# Patient Record
Sex: Male | Born: 1940 | Race: White | Hispanic: No | Smoking: Former smoker
Health system: Southern US, Community
[De-identification: ages and names within clinical notes are randomized; demographics above are authoritative.]

## PROBLEM LIST (undated history)

## (undated) DIAGNOSIS — I4891 Unspecified atrial fibrillation: Secondary | ICD-10-CM

## (undated) DIAGNOSIS — E119 Type 2 diabetes mellitus without complications: Secondary | ICD-10-CM

## (undated) DIAGNOSIS — I2510 Atherosclerotic heart disease of native coronary artery without angina pectoris: Secondary | ICD-10-CM

## (undated) DIAGNOSIS — M1990 Unspecified osteoarthritis, unspecified site: Secondary | ICD-10-CM

## (undated) DIAGNOSIS — I10 Essential (primary) hypertension: Secondary | ICD-10-CM

## (undated) DIAGNOSIS — M0520 Rheumatoid vasculitis with rheumatoid arthritis of unspecified site: Secondary | ICD-10-CM

## (undated) HISTORY — PX: CORONARY ANGIOPLASTY: SHX604

## (undated) HISTORY — PX: CARDIAC CATHETERIZATION: SHX172

## (undated) HISTORY — PX: HIP SURGERY: SHX245

---

## 1998-06-24 ENCOUNTER — Encounter: Payer: Self-pay | Admitting: Rheumatology

## 1998-06-24 ENCOUNTER — Inpatient Hospital Stay (HOSPITAL_COMMUNITY): Admission: AD | Admit: 1998-06-24 | Discharge: 1998-06-28 | Payer: Self-pay | Admitting: Rheumatology

## 1998-06-25 ENCOUNTER — Encounter: Payer: Self-pay | Admitting: Rheumatology

## 1998-06-26 ENCOUNTER — Encounter: Payer: Self-pay | Admitting: Rheumatology

## 1998-06-27 ENCOUNTER — Encounter: Payer: Self-pay | Admitting: Rheumatology

## 1998-07-10 ENCOUNTER — Ambulatory Visit (HOSPITAL_COMMUNITY): Admission: RE | Admit: 1998-07-10 | Discharge: 1998-07-10 | Payer: Self-pay | Admitting: Rheumatology

## 1998-07-10 ENCOUNTER — Encounter: Payer: Self-pay | Admitting: Rheumatology

## 1999-05-07 ENCOUNTER — Encounter: Payer: Self-pay | Admitting: Rheumatology

## 1999-05-07 ENCOUNTER — Ambulatory Visit (HOSPITAL_COMMUNITY): Admission: RE | Admit: 1999-05-07 | Discharge: 1999-05-07 | Payer: Self-pay | Admitting: Rheumatology

## 2000-12-06 ENCOUNTER — Encounter: Admission: RE | Admit: 2000-12-06 | Discharge: 2001-03-06 | Payer: Self-pay | Admitting: Rheumatology

## 2001-01-05 ENCOUNTER — Ambulatory Visit (HOSPITAL_COMMUNITY): Admission: RE | Admit: 2001-01-05 | Discharge: 2001-01-06 | Payer: Self-pay | Admitting: Cardiology

## 2001-04-29 ENCOUNTER — Ambulatory Visit (HOSPITAL_COMMUNITY): Admission: RE | Admit: 2001-04-29 | Discharge: 2001-04-29 | Payer: Self-pay | Admitting: Gastroenterology

## 2011-04-16 ENCOUNTER — Encounter (HOSPITAL_BASED_OUTPATIENT_CLINIC_OR_DEPARTMENT_OTHER): Payer: Self-pay | Admitting: *Deleted

## 2011-04-16 ENCOUNTER — Emergency Department (HOSPITAL_BASED_OUTPATIENT_CLINIC_OR_DEPARTMENT_OTHER)
Admission: EM | Admit: 2011-04-16 | Discharge: 2011-04-16 | Discharge status: Against Medical Advice | Payer: Medicare Other | Attending: Emergency Medicine | Admitting: Emergency Medicine

## 2011-04-16 DIAGNOSIS — M129 Arthropathy, unspecified: Secondary | ICD-10-CM | POA: Insufficient documentation

## 2011-04-16 DIAGNOSIS — I2510 Atherosclerotic heart disease of native coronary artery without angina pectoris: Secondary | ICD-10-CM | POA: Insufficient documentation

## 2011-04-16 DIAGNOSIS — E119 Type 2 diabetes mellitus without complications: Secondary | ICD-10-CM | POA: Insufficient documentation

## 2011-04-16 DIAGNOSIS — I10 Essential (primary) hypertension: Secondary | ICD-10-CM | POA: Insufficient documentation

## 2011-04-16 DIAGNOSIS — T18108A Unspecified foreign body in esophagus causing other injury, initial encounter: Secondary | ICD-10-CM | POA: Insufficient documentation

## 2011-04-16 DIAGNOSIS — Z79899 Other long term (current) drug therapy: Secondary | ICD-10-CM | POA: Insufficient documentation

## 2011-04-16 DIAGNOSIS — IMO0002 Reserved for concepts with insufficient information to code with codable children: Secondary | ICD-10-CM | POA: Insufficient documentation

## 2011-04-16 DIAGNOSIS — Z87891 Personal history of nicotine dependence: Secondary | ICD-10-CM | POA: Insufficient documentation

## 2011-04-16 HISTORY — DX: Atherosclerotic heart disease of native coronary artery without angina pectoris: I25.10

## 2011-04-16 HISTORY — DX: Unspecified osteoarthritis, unspecified site: M19.90

## 2011-04-16 HISTORY — DX: Essential (primary) hypertension: I10

## 2011-04-16 MED ORDER — GLUCAGON HCL (RDNA) 1 MG IJ SOLR
1.0000 mg | Freq: Once | INTRAMUSCULAR | Status: DC
  Filled 2011-04-16: qty 1

## 2011-04-16 NOTE — ED Provider Notes (Signed)
Medical screening examination/treatment/procedure(s) were performed by non-physician practitioner and as supervising physician I was immediately available for consultation/collaboration.  Therin Vetsch, MD 04/16/11 1809 
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
## 2011-04-16 NOTE — ED Notes (Signed)
Was seen at [facility] this am with c.o something stuck in his throat since eating chicken yesterday for lunch. Went here for evaluation.

## 2011-04-16 NOTE — ED Provider Notes (Signed)
History     CSN: [id number]  Arrival date & time 04/16/11  1252   First MD Initiated Contact with Patient 04/16/11 1403      Chief Complaint  Patient presents with  . Foreign Body    (Consider location/radiation/quality/duration/timing/severity/associated sxs/prior treatment) HPI Comments: Pt states that he was eating a cold chicken breast yesterday and he is continuing to have a feeling of it being stuck at about mid chest:pt states that he was sent over by his pcp:pt states that he has intermittent gotten the sensation over the last month, but it usually resolved on it own:pt denies vomiting,sob, and pt states that he can swallow his spit but if he drinks water it comes right back up  The history is provided by the patient. No language interpreter was used.    Past Medical History  Diagnosis Date  . Arthritis   . Diabetes mellitus   . Coronary artery disease   . Hypertension     Past Surgical History  Procedure Date  . Hip surgery     History reviewed. No pertinent family history.  History  Substance Use Topics  . Smoking status: Former Games developer  . Smokeless tobacco: Not on file  . Alcohol Use: No      Review of Systems  Constitutional: Negative.   HENT: Negative.   Respiratory: Negative.   Cardiovascular: Negative.   Genitourinary: Negative.   Neurological: Negative.     Allergies  Review of patient's allergies indicates no known allergies.  Home Medications   Current Outpatient Rx  Name Route Sig Dispense Refill  . ASPIRIN 81 MG PO TABS Oral Take 81 mg by mouth daily.    Marland Kitchen LISINOPRIL PO Oral Take by mouth.    . METFORMIN HCL 500 MG PO TABS Oral Take 500 mg by mouth 2 (two) times daily with a meal.    . METHOTREXATE PO Oral Take by mouth.    Marland Kitchen POTASSIUM CHLORIDE PO Oral Take by mouth.    Marland Kitchen PRAVACHOL PO Oral Take by mouth.    Marland Kitchen PREDNISONE (PAK) PO Oral Take by mouth.    Marland Kitchen PROPRANOLOL HCL ER PO Oral Take by mouth.      BP 109/71  Pulse 71   Temp(Src) 98.2 F (36.8 C) (Oral)  Resp 20  SpO2 100%  Physical Exam  Nursing note and vitals reviewed. Constitutional: He appears well-developed and well-nourished.  HENT:  Head: Normocephalic and atraumatic.  Cardiovascular: Normal rate and regular rhythm.   Pulmonary/Chest: Effort normal and breath sounds normal.  Abdominal: Soft. Bowel sounds are normal. There is no tenderness.  Musculoskeletal: Normal range of motion.  Neurological: He is alert.  Skin: Skin is warm and dry.    ED Course  Procedures (including critical care time)  Labs Reviewed - No data to display No results found.   1. Esophageal foreign body       MDM  Discussed with pt that we could try glucagon here, but we don't have a scope here:pt  Very upset with this and discussed that if we deemed necessary we could transport to another facility:pt left after conversation without notifying anyone        Teressa Lower, NP 04/16/11 1549

## 2016-12-02 ENCOUNTER — Ambulatory Visit: Payer: No Typology Code available for payment source | Admitting: Adult Health

## 2020-01-26 ENCOUNTER — Other Ambulatory Visit: Payer: Self-pay

## 2020-01-26 ENCOUNTER — Emergency Department (HOSPITAL_BASED_OUTPATIENT_CLINIC_OR_DEPARTMENT_OTHER): Payer: Medicare Other

## 2020-01-26 ENCOUNTER — Encounter (HOSPITAL_BASED_OUTPATIENT_CLINIC_OR_DEPARTMENT_OTHER): Payer: Self-pay | Admitting: Emergency Medicine

## 2020-01-26 ENCOUNTER — Inpatient Hospital Stay (HOSPITAL_BASED_OUTPATIENT_CLINIC_OR_DEPARTMENT_OTHER)
Admission: EM | Admit: 2020-01-26 | Discharge: 2020-03-19 | DRG: 177 | Disposition: E | Payer: Medicare Other | Attending: Family Medicine | Admitting: Family Medicine

## 2020-01-26 DIAGNOSIS — R52 Pain, unspecified: Secondary | ICD-10-CM

## 2020-01-26 DIAGNOSIS — R069 Unspecified abnormalities of breathing: Secondary | ICD-10-CM

## 2020-01-26 DIAGNOSIS — E1165 Type 2 diabetes mellitus with hyperglycemia: Secondary | ICD-10-CM | POA: Diagnosis present

## 2020-01-26 DIAGNOSIS — T361X5A Adverse effect of cephalosporins and other beta-lactam antibiotics, initial encounter: Secondary | ICD-10-CM | POA: Diagnosis not present

## 2020-01-26 DIAGNOSIS — J189 Pneumonia, unspecified organism: Secondary | ICD-10-CM

## 2020-01-26 DIAGNOSIS — Z833 Family history of diabetes mellitus: Secondary | ICD-10-CM

## 2020-01-26 DIAGNOSIS — R1310 Dysphagia, unspecified: Secondary | ICD-10-CM | POA: Diagnosis present

## 2020-01-26 DIAGNOSIS — J9601 Acute respiratory failure with hypoxia: Secondary | ICD-10-CM

## 2020-01-26 DIAGNOSIS — R109 Unspecified abdominal pain: Secondary | ICD-10-CM

## 2020-01-26 DIAGNOSIS — L89156 Pressure-induced deep tissue damage of sacral region: Secondary | ICD-10-CM | POA: Diagnosis present

## 2020-01-26 DIAGNOSIS — E871 Hypo-osmolality and hyponatremia: Secondary | ICD-10-CM

## 2020-01-26 DIAGNOSIS — Z881 Allergy status to other antibiotic agents status: Secondary | ICD-10-CM

## 2020-01-26 DIAGNOSIS — R0602 Shortness of breath: Secondary | ICD-10-CM

## 2020-01-26 DIAGNOSIS — Z7902 Long term (current) use of antithrombotics/antiplatelets: Secondary | ICD-10-CM

## 2020-01-26 DIAGNOSIS — M6088 Other myositis, other site: Secondary | ICD-10-CM | POA: Diagnosis present

## 2020-01-26 DIAGNOSIS — Z79899 Other long term (current) drug therapy: Secondary | ICD-10-CM

## 2020-01-26 DIAGNOSIS — J15211 Pneumonia due to Methicillin susceptible Staphylococcus aureus: Secondary | ICD-10-CM | POA: Diagnosis not present

## 2020-01-26 DIAGNOSIS — M25569 Pain in unspecified knee: Secondary | ICD-10-CM

## 2020-01-26 DIAGNOSIS — M4656 Other infective spondylopathies, lumbar region: Secondary | ICD-10-CM | POA: Diagnosis present

## 2020-01-26 DIAGNOSIS — D696 Thrombocytopenia, unspecified: Secondary | ICD-10-CM

## 2020-01-26 DIAGNOSIS — I5032 Chronic diastolic (congestive) heart failure: Secondary | ICD-10-CM | POA: Diagnosis present

## 2020-01-26 DIAGNOSIS — R0600 Dyspnea, unspecified: Secondary | ICD-10-CM

## 2020-01-26 DIAGNOSIS — R0902 Hypoxemia: Secondary | ICD-10-CM

## 2020-01-26 DIAGNOSIS — J1282 Pneumonia due to coronavirus disease 2019: Secondary | ICD-10-CM

## 2020-01-26 DIAGNOSIS — R0603 Acute respiratory distress: Secondary | ICD-10-CM

## 2020-01-26 DIAGNOSIS — E1151 Type 2 diabetes mellitus with diabetic peripheral angiopathy without gangrene: Secondary | ICD-10-CM | POA: Diagnosis present

## 2020-01-26 DIAGNOSIS — Y92230 Patient room in hospital as the place of occurrence of the external cause: Secondary | ICD-10-CM | POA: Diagnosis not present

## 2020-01-26 DIAGNOSIS — G928 Other toxic encephalopathy: Secondary | ICD-10-CM | POA: Diagnosis not present

## 2020-01-26 DIAGNOSIS — I110 Hypertensive heart disease with heart failure: Secondary | ICD-10-CM | POA: Diagnosis present

## 2020-01-26 DIAGNOSIS — U071 COVID-19: Principal | ICD-10-CM

## 2020-01-26 DIAGNOSIS — I2510 Atherosclerotic heart disease of native coronary artery without angina pectoris: Secondary | ICD-10-CM | POA: Diagnosis present

## 2020-01-26 DIAGNOSIS — Z7952 Long term (current) use of systemic steroids: Secondary | ICD-10-CM

## 2020-01-26 DIAGNOSIS — Z515 Encounter for palliative care: Secondary | ICD-10-CM

## 2020-01-26 DIAGNOSIS — N170 Acute kidney failure with tubular necrosis: Secondary | ICD-10-CM | POA: Diagnosis present

## 2020-01-26 DIAGNOSIS — E11649 Type 2 diabetes mellitus with hypoglycemia without coma: Secondary | ICD-10-CM | POA: Diagnosis not present

## 2020-01-26 DIAGNOSIS — B9561 Methicillin susceptible Staphylococcus aureus infection as the cause of diseases classified elsewhere: Secondary | ICD-10-CM

## 2020-01-26 DIAGNOSIS — Z791 Long term (current) use of non-steroidal anti-inflammatories (NSAID): Secondary | ICD-10-CM

## 2020-01-26 DIAGNOSIS — D638 Anemia in other chronic diseases classified elsewhere: Secondary | ICD-10-CM | POA: Diagnosis present

## 2020-01-26 DIAGNOSIS — L8990 Pressure ulcer of unspecified site, unspecified stage: Secondary | ICD-10-CM | POA: Insufficient documentation

## 2020-01-26 DIAGNOSIS — M069 Rheumatoid arthritis, unspecified: Secondary | ICD-10-CM | POA: Diagnosis present

## 2020-01-26 DIAGNOSIS — R7881 Bacteremia: Secondary | ICD-10-CM

## 2020-01-26 DIAGNOSIS — E669 Obesity, unspecified: Secondary | ICD-10-CM | POA: Diagnosis present

## 2020-01-26 DIAGNOSIS — E1159 Type 2 diabetes mellitus with other circulatory complications: Secondary | ICD-10-CM | POA: Diagnosis present

## 2020-01-26 DIAGNOSIS — Z66 Do not resuscitate: Secondary | ICD-10-CM | POA: Diagnosis not present

## 2020-01-26 DIAGNOSIS — K5900 Constipation, unspecified: Secondary | ICD-10-CM | POA: Diagnosis present

## 2020-01-26 DIAGNOSIS — I480 Paroxysmal atrial fibrillation: Secondary | ICD-10-CM | POA: Diagnosis present

## 2020-01-26 DIAGNOSIS — D6959 Other secondary thrombocytopenia: Secondary | ICD-10-CM | POA: Diagnosis not present

## 2020-01-26 DIAGNOSIS — A4101 Sepsis due to Methicillin susceptible Staphylococcus aureus: Secondary | ICD-10-CM | POA: Diagnosis not present

## 2020-01-26 DIAGNOSIS — Z95828 Presence of other vascular implants and grafts: Secondary | ICD-10-CM

## 2020-01-26 DIAGNOSIS — I4821 Permanent atrial fibrillation: Secondary | ICD-10-CM | POA: Diagnosis present

## 2020-01-26 DIAGNOSIS — J069 Acute upper respiratory infection, unspecified: Secondary | ICD-10-CM | POA: Diagnosis present

## 2020-01-26 DIAGNOSIS — J80 Acute respiratory distress syndrome: Secondary | ICD-10-CM | POA: Diagnosis not present

## 2020-01-26 DIAGNOSIS — R6521 Severe sepsis with septic shock: Secondary | ICD-10-CM | POA: Diagnosis not present

## 2020-01-26 DIAGNOSIS — R54 Age-related physical debility: Secondary | ICD-10-CM | POA: Diagnosis present

## 2020-01-26 DIAGNOSIS — Z7984 Long term (current) use of oral hypoglycemic drugs: Secondary | ICD-10-CM

## 2020-01-26 DIAGNOSIS — Z6829 Body mass index (BMI) 29.0-29.9, adult: Secondary | ICD-10-CM

## 2020-01-26 DIAGNOSIS — Z87891 Personal history of nicotine dependence: Secondary | ICD-10-CM

## 2020-01-26 DIAGNOSIS — G062 Extradural and subdural abscess, unspecified: Secondary | ICD-10-CM | POA: Diagnosis present

## 2020-01-26 DIAGNOSIS — Z955 Presence of coronary angioplasty implant and graft: Secondary | ICD-10-CM

## 2020-01-26 HISTORY — DX: Type 2 diabetes mellitus without complications: E11.9

## 2020-01-26 HISTORY — DX: Unspecified atrial fibrillation: I48.91

## 2020-01-26 HISTORY — DX: Rheumatoid vasculitis with rheumatoid arthritis of unspecified site: M05.20

## 2020-01-26 LAB — HEPATIC FUNCTION PANEL
ALT: 18 U/L (ref 0–44)
AST: 37 U/L (ref 15–41)
Albumin: 3.4 g/dL — ABNORMAL LOW (ref 3.5–5.0)
Alkaline Phosphatase: 31 U/L — ABNORMAL LOW (ref 38–126)
Bilirubin, Direct: 0.3 mg/dL — ABNORMAL HIGH (ref 0.0–0.2)
Indirect Bilirubin: 0.8 mg/dL (ref 0.3–0.9)
Total Bilirubin: 1.1 mg/dL (ref 0.3–1.2)
Total Protein: 7 g/dL (ref 6.5–8.1)

## 2020-01-26 LAB — COMPREHENSIVE METABOLIC PANEL
ALT: 18 U/L (ref 0–44)
AST: 30 U/L (ref 15–41)
Albumin: 2.8 g/dL — ABNORMAL LOW (ref 3.5–5.0)
Alkaline Phosphatase: 31 U/L — ABNORMAL LOW (ref 38–126)
Anion gap: 11 (ref 5–15)
BUN: 13 mg/dL (ref 8–23)
CO2: 19 mmol/L — ABNORMAL LOW (ref 22–32)
Calcium: 7.5 mg/dL — ABNORMAL LOW (ref 8.9–10.3)
Chloride: 98 mmol/L (ref 98–111)
Creatinine, Ser: 0.85 mg/dL (ref 0.61–1.24)
GFR, Estimated: >60 mL/min (ref >60)
Glucose, Bld: 119 mg/dL — ABNORMAL HIGH (ref 70–99)
Potassium: 3.7 mmol/L (ref 3.5–5.1)
Sodium: 128 mmol/L — ABNORMAL LOW (ref 135–145)
Total Bilirubin: 0.9 mg/dL (ref 0.3–1.2)
Total Protein: 5.7 g/dL — ABNORMAL LOW (ref 6.5–8.1)

## 2020-01-26 LAB — CBC
HCT: 34.4 % — ABNORMAL LOW (ref 39.0–52.0)
Hemoglobin: 11.7 g/dL — ABNORMAL LOW (ref 13.0–17.0)
MCH: 31.7 pg (ref 26.0–34.0)
MCHC: 34 g/dL (ref 30.0–36.0)
MCV: 93.2 fL (ref 80.0–100.0)
Platelets: 195 10*3/uL (ref 150–400)
RBC: 3.69 MIL/uL — ABNORMAL LOW (ref 4.22–5.81)
RDW: 14.6 % (ref 11.5–15.5)
WBC: 3.7 10*3/uL — ABNORMAL LOW (ref 4.0–10.5)
nRBC: 0 % (ref 0.0–0.2)

## 2020-01-26 LAB — BASIC METABOLIC PANEL
Anion gap: 14 (ref 5–15)
BUN: 13 mg/dL (ref 8–23)
CO2: 19 mmol/L — ABNORMAL LOW (ref 22–32)
Calcium: 7.9 mg/dL — ABNORMAL LOW (ref 8.9–10.3)
Chloride: 96 mmol/L — ABNORMAL LOW (ref 98–111)
Creatinine, Ser: 1.05 mg/dL (ref 0.61–1.24)
GFR, Estimated: >60 mL/min (ref >60)
Glucose, Bld: 127 mg/dL — ABNORMAL HIGH (ref 70–99)
Potassium: 3.7 mmol/L (ref 3.5–5.1)
Sodium: 129 mmol/L — ABNORMAL LOW (ref 135–145)

## 2020-01-26 LAB — TROPONIN I (HIGH SENSITIVITY)
Troponin I (High Sensitivity): 12 ng/L (ref <18)
Troponin I (High Sensitivity): 13 ng/L (ref <18)

## 2020-01-26 LAB — URINALYSIS, ROUTINE W REFLEX MICROSCOPIC
Bilirubin Urine: NEGATIVE
Glucose, UA: NEGATIVE mg/dL
Hgb urine dipstick: NEGATIVE
Ketones, ur: 15 mg/dL — AB
Leukocytes,Ua: NEGATIVE
Nitrite: NEGATIVE
Protein, ur: NEGATIVE mg/dL
Specific Gravity, Urine: 1.015 (ref 1.005–1.030)
pH: 5 (ref 5.0–8.0)

## 2020-01-26 LAB — LIPASE, BLOOD: Lipase: 46 U/L (ref 11–51)

## 2020-01-26 LAB — TRIGLYCERIDES: Triglycerides: 67 mg/dL (ref <150)

## 2020-01-26 LAB — RESP PANEL BY RT-PCR (FLU A&B, COVID) ARPGX2
Influenza A by PCR: NEGATIVE
Influenza B by PCR: NEGATIVE
SARS Coronavirus 2 by RT PCR: POSITIVE — AB

## 2020-01-26 LAB — C-REACTIVE PROTEIN: CRP: 17.6 mg/dL — ABNORMAL HIGH (ref <1.0)

## 2020-01-26 LAB — PROCALCITONIN: Procalcitonin: 0.17 ng/mL

## 2020-01-26 LAB — BRAIN NATRIURETIC PEPTIDE: B Natriuretic Peptide: 93.1 pg/mL (ref 0.0–100.0)

## 2020-01-26 LAB — D-DIMER, QUANTITATIVE: D-Dimer, Quant: 2.01 ug/mL-FEU — ABNORMAL HIGH (ref 0.00–0.50)

## 2020-01-26 LAB — LACTIC ACID, PLASMA
Lactic Acid, Venous: 1.2 mmol/L (ref 0.5–1.9)
Lactic Acid, Venous: 1.5 mmol/L (ref 0.5–1.9)

## 2020-01-26 LAB — LACTATE DEHYDROGENASE: LDH: 280 U/L — ABNORMAL HIGH (ref 98–192)

## 2020-01-26 LAB — FERRITIN: Ferritin: 503 ng/mL — ABNORMAL HIGH (ref 24–336)

## 2020-01-26 LAB — FIBRINOGEN: Fibrinogen: 574 mg/dL — ABNORMAL HIGH (ref 210–475)

## 2020-01-26 IMAGING — CR DG CHEST 2V
2 series · 2 of 2 positions shown · non-contrast
Comparison: [DATE] and [DATE]

CLINICAL DATA: Shortness of breath, cough.

EXAM:
CHEST - 2 VIEW

[w chest pa]
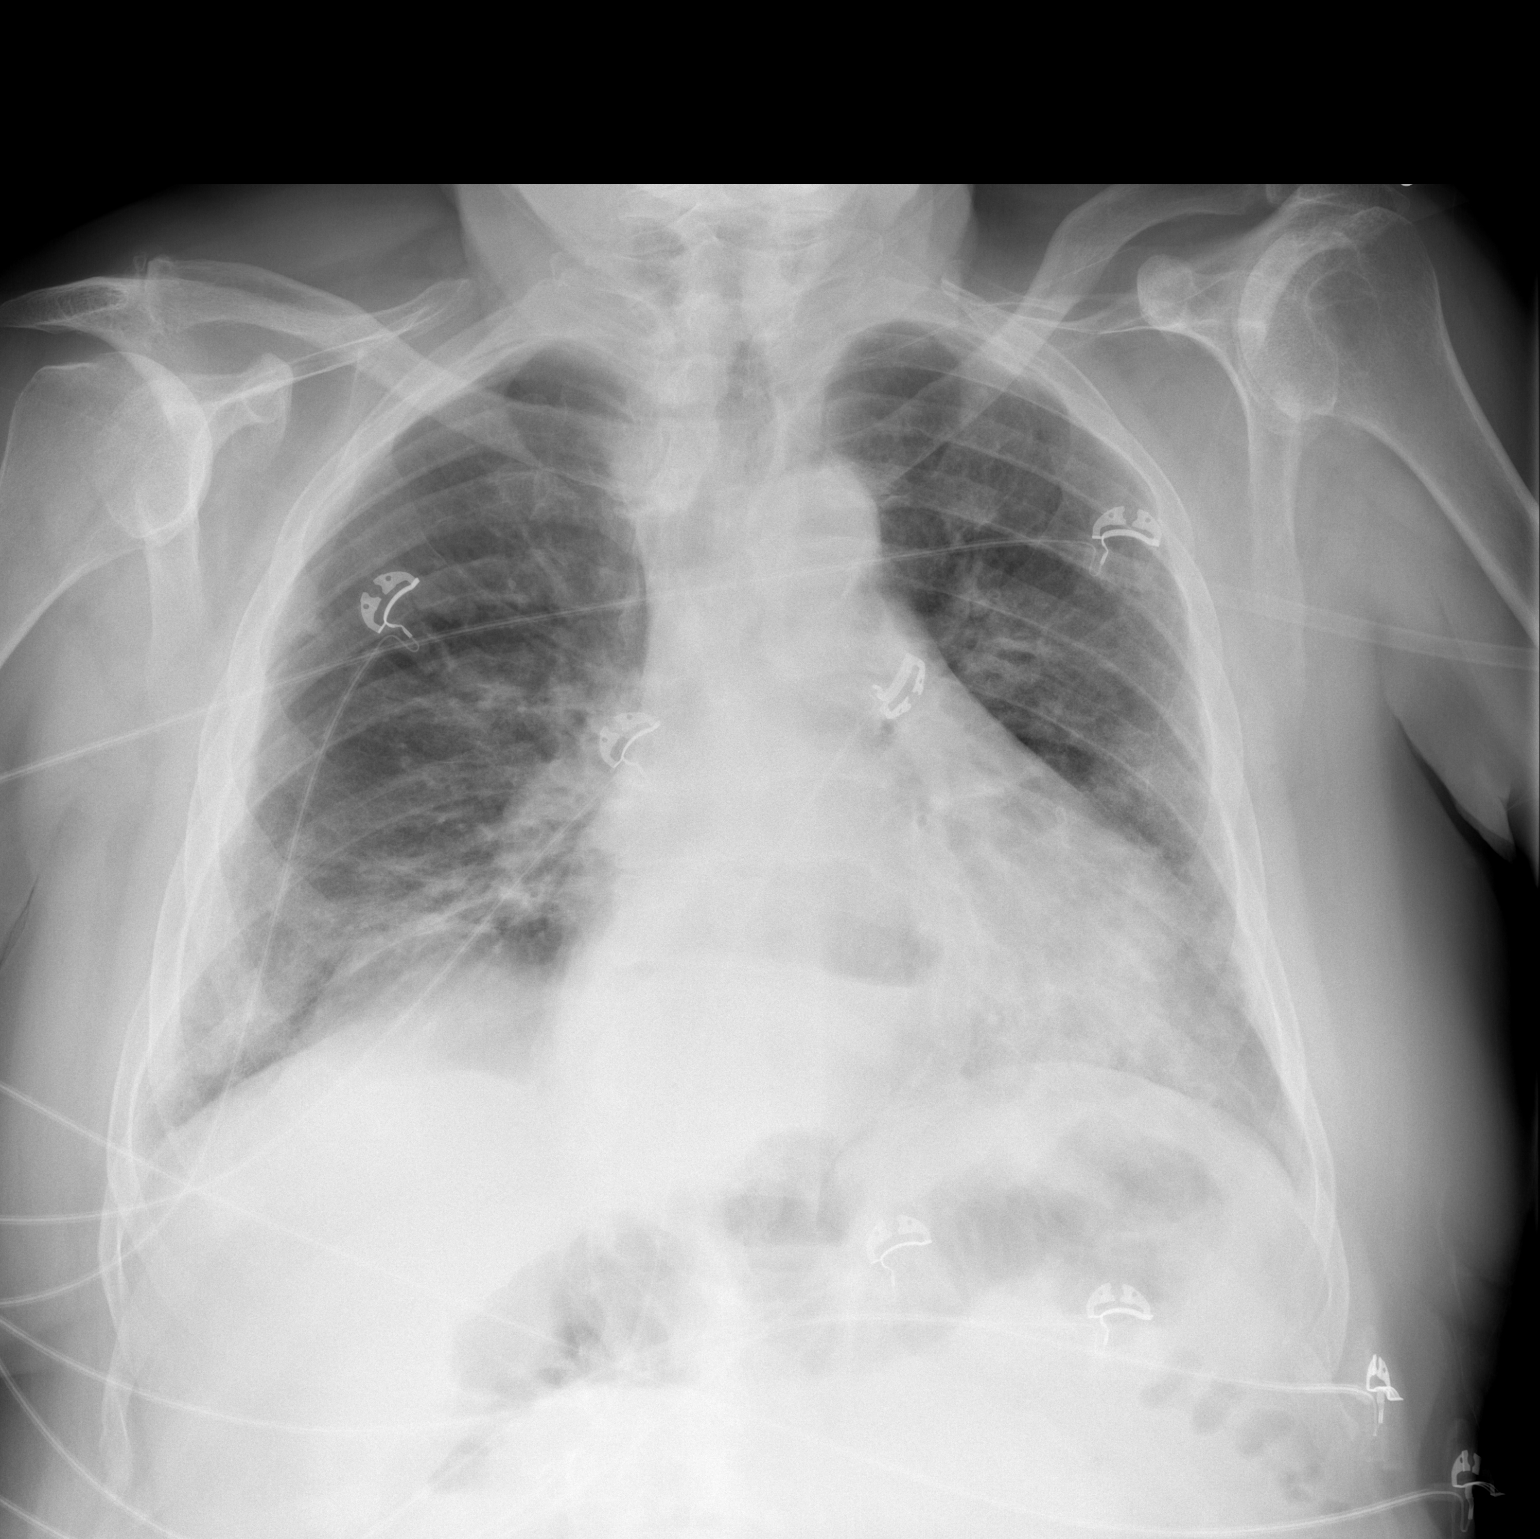

[w chest lat]
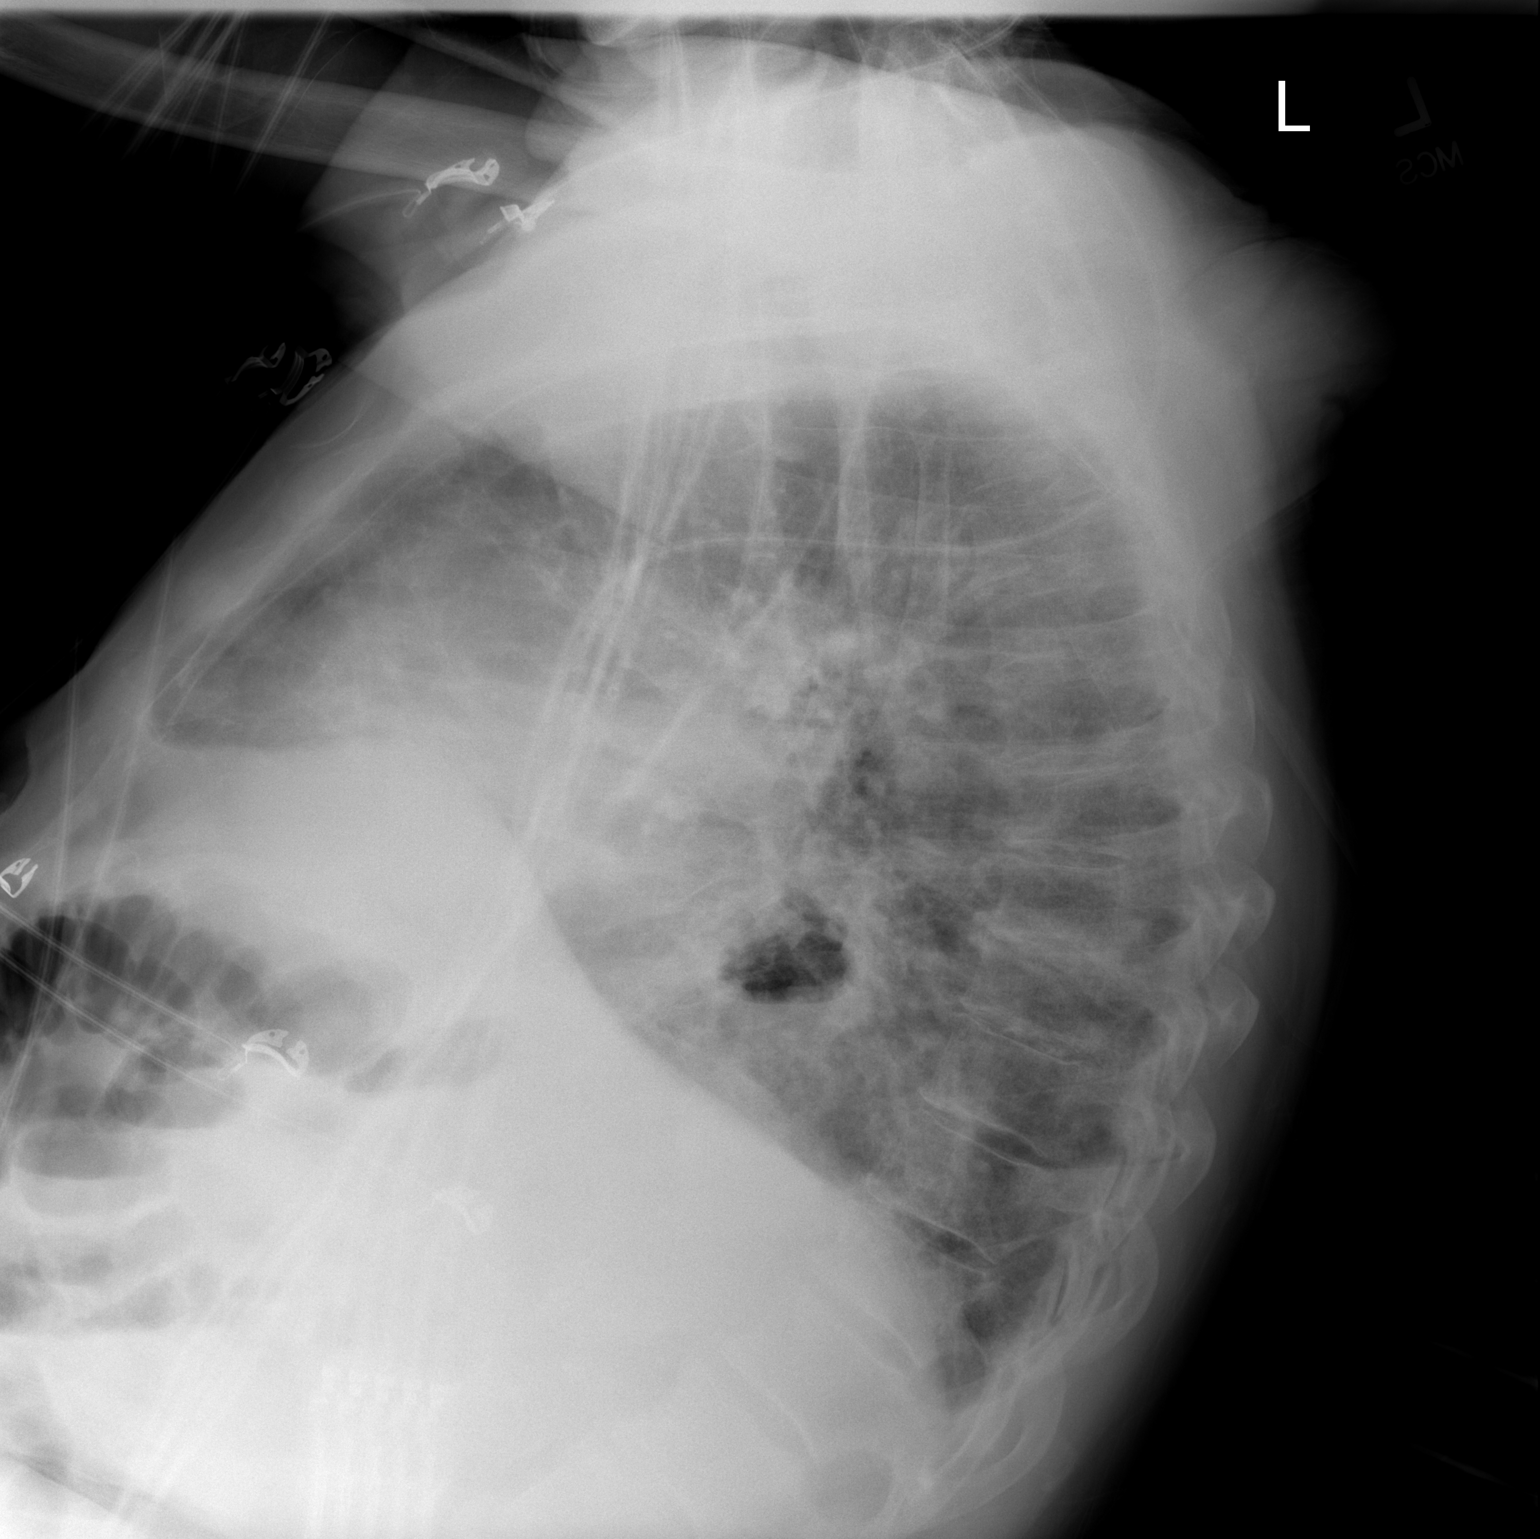

[2 of 2 positions shown; findings below may reference images not displayed]

FINDINGS: Cardiac enlargement similar to the prior study. Eventration of the
RIGHT hemidiaphragm is also similar. Large hiatal hernia behind the
heart.

Patchy opacities in the bilateral chest with basilar and mid lung
predominance. No lobar consolidative changes. No sign of pleural
effusion.

On limited assessment no acute skeletal process.
IMPRESSION: 1. Multifocal opacities suspicious for multifocal pneumonia,
consider COVID 19 infection.
2. Large hiatal hernia.
3. Cardiomegaly as before.

## 2020-01-26 MED ORDER — SODIUM CHLORIDE 0.9 % IV SOLN
100.0000 mg | INTRAVENOUS | Status: AC
  Administered 2020-01-26 (×2): 100 mg via INTRAVENOUS

## 2020-01-26 MED ORDER — DEXAMETHASONE SODIUM PHOSPHATE 10 MG/ML IJ SOLN
8.0000 mg | Freq: Once | INTRAMUSCULAR | Status: AC
  Administered 2020-01-26: 8 mg via INTRAVENOUS
  Filled 2020-01-26: qty 1

## 2020-01-26 MED ORDER — ALBUTEROL SULFATE HFA 108 (90 BASE) MCG/ACT IN AERS
2.0000 | INHALATION_SPRAY | RESPIRATORY_TRACT | Status: DC | PRN
  Administered 2020-01-30 – 2020-02-08 (×3): 2 via RESPIRATORY_TRACT
  Filled 2020-01-26 (×2): qty 6.7

## 2020-01-26 MED ORDER — SODIUM CHLORIDE 0.9 % IV SOLN
100.0000 mg | Freq: Every day | INTRAVENOUS | Status: AC
  Administered 2020-01-27 – 2020-01-30 (×4): 100 mg via INTRAVENOUS
  Filled 2020-01-26: qty 100
  Filled 2020-01-26 (×2): qty 2.5
  Filled 2020-01-26: qty 100

## 2020-01-26 MED ORDER — ACETAMINOPHEN 500 MG PO TABS
500.0000 mg | ORAL_TABLET | Freq: Once | ORAL | Status: AC
  Administered 2020-01-26: 500 mg via ORAL
  Filled 2020-01-26: qty 1

## 2020-01-26 NOTE — ED Provider Notes (Signed)
[facility] EMERGENCY DEPARTMENT Provider Note   CSN: [id number] Arrival date & time: 01/31/2020  1305     History Chief Complaint  Patient presents with  . Shortness of Breath    + Covid    Javier Beard is a 80 y.o. male history includes CAD, diabetes, hypertension, obesity.  Patient arrives today with his daughter who provides supplemental history.  And family report patient experiencing nausea vomiting diarrhea x2 weeks, symptoms gradually resolved over the past 1 week, he was feeling well until yesterday he reports diarrhea returned.  He reports multiple episodes of nonbloody diarrhea without abdominal pain.  Simultaneously he developed shortness of breath yesterday he reports difficulty catching his breath that has been constant no clear aggravating factors not associated with exertion.  He is also developed a mild nonproductive cough and mild generalized headache.  Denies fever, vision changes, difficulty speaking, numbness/weakness, tingling, chest pain, hemoptysis, abdominal pain, vomiting, dysuria/hematuria or any additional concerns.  Of note patient reports he received 2 Covid vaccines in April 2021. HPI     Past Medical History:  Diagnosis Date  . Arthritis   . Coronary artery disease   . Diabetes mellitus   . Hypertension     There are no problems to display for this patient.   Past Surgical History:  Procedure Laterality Date  . HIP SURGERY         No family history on file.  Social History   Tobacco Use  . Smoking status: Former Games developer  . Smokeless tobacco: Never Used  Substance Use Topics  . Alcohol use: No  . Drug use: No    Home Medications Prior to Admission medications   Medication Sig Start Date End Date Taking? Authorizing Provider  acetaminophen (TYLENOL) 650 MG CR tablet Take 650 mg by mouth daily. 08/27/15  Yes [provider]  calcium carbonate (OSCAL) 1500 (600 Ca) MG TABS tablet Take 600-1,200 mg by mouth See admin  instructions. Take 1200mg  by mouth in the morning and 600mg  at bedtime. 09/30/15  Yes [provider]  denosumab (PROLIA) 60 MG/ML SOSY injection Inject 60 mg into the skin every 6 (six) months.   Yes [provider]  diclofenac Sodium (VOLTAREN) 1 % GEL Apply 1 application topically daily as needed for pain. 02/25/15  Yes [provider]  doxazosin (CARDURA) 1 MG tablet Take 1 mg by mouth at bedtime. 01/15/20  Yes [provider]  ELIQUIS 5 MG TABS tablet Take 5 mg by mouth 2 (two) times daily. 12/18/19  Yes [provider]  ergocalciferol (VITAMIN D2) 1.25 MG (50000 UT) capsule Take 1 capsule by mouth every 14 (fourteen) days. 09/27/19  Yes [provider]  folic acid (FOLVITE) 1 MG tablet Take 1 tablet by mouth daily. 11/15/19  Yes [provider]  furosemide (LASIX) 20 MG tablet Take 60 mg by mouth daily. 01/01/20  Yes [provider]  glipiZIDE (GLUCOTROL) 5 MG tablet Take 5 mg by mouth daily. 01/01/20  Yes [provider]  golimumab (SIMPONI ARIA) 50 MG/4ML SOLN injection Inject 12.5 mg into the vein every 8 (eight) weeks.   Yes [provider]  hydroxychloroquine (PLAQUENIL) 200 MG tablet Take 200 mg by mouth 2 (two) times daily. 01/01/20  Yes [provider]  losartan (COZAAR) 50 MG tablet Take 50 mg by mouth 2 (two) times daily. 11/13/19  Yes [provider]  methotrexate 2.5 MG tablet Take 25 mg by mouth once a week. 01/01/20  Yes [provider]  pantoprazole (PROTONIX) 40 MG tablet Take 40 mg by mouth daily. 01/01/20  Yes [provider]  potassium chloride (KLOR-CON) 10 MEQ tablet Take 10 mEq by mouth 2 (two) times daily.   Yes [provider]  pravastatin (PRAVACHOL) 80 MG tablet Take 80 mg by mouth daily.   Yes [provider]  predniSONE (DELTASONE) 5 MG tablet Take 5 mg by mouth daily. Take with food or milk.   Yes [provider]   vitamin B-12 (CYANOCOBALAMIN) 1000 MCG tablet Take 1,000 mcg by mouth daily.   Yes [provider]    Allergies    Patient has no known allergies.  Review of Systems   Review of Systems Ten systems are reviewed and are negative for acute change except as noted in the HPI  Physical Exam Updated Vital Signs BP 133/79   Pulse 94   Temp (!) 100.4 F (38 C) (Oral)   Resp 20   Ht 5\' 6"  (1.676 m)   Wt 81.6 kg   SpO2 96%   BMI 29.05 kg/m   Physical Exam Constitutional:      General: He is not in acute distress.    Appearance: Normal appearance. He is well-developed. He is not ill-appearing or diaphoretic.  HENT:     Head: Normocephalic and atraumatic.  Eyes:     General: Vision grossly intact. Gaze aligned appropriately.     Pupils: Pupils are equal, round, and reactive to light.  Neck:     Trachea: Trachea and phonation normal.  Cardiovascular:     Rate and Rhythm: Normal rate. Rhythm irregular.  Pulmonary:     Effort: Pulmonary effort is normal. No accessory muscle usage or respiratory distress.     Breath sounds: Rales present.  Abdominal:     General: There is no distension.     Palpations: Abdomen is soft.     Tenderness: There is no abdominal tenderness. There is no guarding or rebound.  Musculoskeletal:        General: Normal range of motion.     Cervical back: Normal range of motion.     Right lower leg: 2+ Edema present.     Left lower leg: 2+ Edema present.  Skin:    General: Skin is warm and dry.  Neurological:     Mental Status: He is alert.     GCS: GCS eye subscore is 4. GCS verbal subscore is 5. GCS motor subscore is 6.     Comments: Speech is clear and goal oriented, follows commands Major Cranial nerves without deficit, no facial droop Moves extremities without ataxia, coordination intact  Psychiatric:        Behavior: Behavior normal.     ED Results / Procedures / Treatments   Labs (all labs ordered are listed, but only abnormal  results are displayed) Labs Reviewed  RESP PANEL BY RT-PCR (FLU A&B, COVID) ARPGX2 - Abnormal; Notable for the following components:      Result Value   SARS Coronavirus 2 by RT PCR POSITIVE (*)    All other components within normal limits  BASIC METABOLIC PANEL - Abnormal; Notable for the following components:   Sodium 129 (*)    Chloride 96 (*)    CO2 19 (*)    Glucose, Bld 127 (*)    Calcium 7.9 (*)    All other components within normal limits  CBC - Abnormal; Notable for the following components:   WBC 3.7 (*)  RBC 3.69 (*)    Hemoglobin 11.7 (*)    HCT 34.4 (*)    All other components within normal limits  HEPATIC FUNCTION PANEL - Abnormal; Notable for the following components:   Albumin 3.4 (*)    Alkaline Phosphatase 31 (*)    Bilirubin, Direct 0.3 (*)    All other components within normal limits  CULTURE, BLOOD (ROUTINE X 2)  CULTURE, BLOOD (ROUTINE X 2)  BRAIN NATRIURETIC PEPTIDE  LIPASE, BLOOD  URINALYSIS, ROUTINE W REFLEX MICROSCOPIC  LACTIC ACID, PLASMA  LACTIC ACID, PLASMA  COMPREHENSIVE METABOLIC PANEL  D-DIMER, QUANTITATIVE (NOT AT [facility])  PROCALCITONIN  LACTATE DEHYDROGENASE  FERRITIN  TRIGLYCERIDES  FIBRINOGEN  C-REACTIVE PROTEIN  TROPONIN I (HIGH SENSITIVITY)  TROPONIN I (HIGH SENSITIVITY)    EKG EKG Interpretation  Date/Time:  Friday 17-Feb-2020 13:09:09 EST Ventricular Rate:  96 PR Interval:    QRS Duration: 86 QT Interval:  348 QTC Calculation: 439 R Axis:   90 Text Interpretation: Atrial fibrillation Rightward axis ST & T wave abnormality, consider lateral ischemia Abnormal ECG when compared to prior, possible new afib. will repeat. No sTEMI Confirmed by Theda Belfast ([id number]) on Feb 17, 2020 1:36:15 PM   Radiology DG Chest 2 View  Result Date: 02/17/20 CLINICAL DATA:  Shortness of breath, cough. EXAM: CHEST - 2 VIEW COMPARISON:  April 28, 2019 and February 03, 2019 FINDINGS: Cardiac enlargement similar to the prior study. Eventration  of the RIGHT hemidiaphragm is also similar. Large hiatal hernia behind the heart. Patchy opacities in the bilateral chest with basilar and mid lung predominance. No lobar consolidative changes. No sign of pleural effusion. On limited assessment no acute skeletal process. IMPRESSION: 1. Multifocal opacities suspicious for multifocal pneumonia, consider COVID 19 infection. 2. Large hiatal hernia. 3. Cardiomegaly as before. Electronically Signed   By: Donzetta Kohut M.D.   On: 17-Feb-2020 14:33    Procedures .Critical Care Performed by: Bill Salinas, PA-C Authorized by: Bill Salinas, PA-C   Critical care provider statement:    Critical care time (minutes):  40   Critical care was necessary to treat or prevent imminent or life-threatening deterioration of the following conditions:  Respiratory failure   Critical care was time spent personally by me on the following activities:  Discussions with consultants, evaluation of patient's response to treatment, examination of patient, ordering and performing treatments and interventions, ordering and review of laboratory studies, ordering and review of radiographic studies, pulse oximetry, re-evaluation of patient's condition, obtaining history from patient or surrogate, review of old charts and development of treatment plan with patient or surrogate   (including critical care time)  Medications Ordered in ED Medications  albuterol (VENTOLIN HFA) 108 (90 Base) MCG/ACT inhaler 2 puff (has no administration in time range)  remdesivir 100 mg in sodium chloride 0.9 % 100 mL IVPB (has no administration in time range)  remdesivir 100 mg in sodium chloride 0.9 % 100 mL IVPB (has no administration in time range)  acetaminophen (TYLENOL) tablet 500 mg (500 mg Oral Given 02/17/20 1600)  dexamethasone (DECADRON) injection 8 mg (8 mg Intravenous Given Feb 17, 2020 1600)    ED Course  I have reviewed the triage vital signs and the nursing notes.  Pertinent labs  & imaging results that were available during my care of the patient were reviewed by me and considered in my medical decision making (see chart for details).    MDM Rules/Calculators/A&P  Additional history obtained from: 1. Nursing notes from this visit. 2. Family at bedside. 3. Review of electronic medical records. ------------------  80 year old male presented with shortness of breath headache cough and diarrhea onset yesterday.  He reports nausea vomiting diarrhea x2 weeks but had been improving until yesterday.  He arrives with SPO2 upper 80s on room air.  Patient placed on 2 L nasal cannula with improvement.  Basic labs were ordered in triage, have added COVID-19 test to work-up as well as BNP. He has symmetric lower extremity swelling and bilateral rales concern for possible CHF exacerbation versus Covid low suspicion for PE at this time given lack of chest pain/tachycardia. - I ordered, reviewed and interpreted labs which include: Covid test positive, suspect this is the etiology of patient's symptoms today. CBC shows mild leukopenia of 3.7 consistent with COVID-19 viral infection, mild anemia of 11.7, no priors to compare. BMP shows hyponatremia of 129, mild decreased bicarb and chloride, no AKI or gap. LFTs without emergent elevation, no abdominal pain to suggest emergent biliary disease BNP within normal limits lower suspicion for CHF exacerbation given Covid test positive. See troponin within normal limits, reassuring. Lipase within normal limits, doubt pancreatitis.  CXR:  IMPRESSION:  1. Multifocal opacities suspicious for multifocal pneumonia,  consider COVID 19 infection.  2. Large hiatal hernia.  3. Cardiomegaly as before.   EKG: Atrial fibrillation Rightward axis ST & T wave abnormality, consider lateral ischemia Abnormal ECG when compared to prior, possible new afib. will repeat. No sTEMI Confirmed by Theda Belfast ([id number]) on February 19, 2020 1:36:15  PM - Patient's Covid test resulted positive, this explains of patient's recent illness.  He developed mild fever while in the ER this was treated with Tylenol.  Patient will need admission given new oxygen requirement.  Patient was reassessed he is resting comfortably on 2 L nasal cannula no acute distress states understanding of care plan and he is agreeable for admission.  Remdesivir ordered per pharmacy along with 8 mg Decadron.  Remainder of Covid labs ordered.  Consult placed to hospitalist service. - 4:44 PM: Consult with Dr. Ophelia Charter, patient accepted to hospitalist service.  Javier Beard was evaluated in Emergency Department on February 19, 2020 for the symptoms described in the history of present illness. He was evaluated in the context of the global COVID-19 pandemic, which necessitated consideration that the patient might be at risk for infection with the SARS-CoV-2 virus that causes COVID-19. Institutional protocols and algorithms that pertain to the evaluation of patients at risk for COVID-19 are in a state of rapid change based on information released by regulatory bodies including the CDC and federal and state organizations. These policies and algorithms were followed during the patient's care in the ED.  Note: Portions of this report may have been transcribed using voice recognition software. Every effort was made to ensure accuracy; however, inadvertent computerized transcription errors may still be present. Final Clinical Impression(s) / ED Diagnoses Final diagnoses:  COVID-19 virus infection  Multifocal pneumonia  Acute respiratory failure with hypoxia [facility])    Rx / DC Orders ED Discharge Orders    None       Elizabeth Palau 02/19/20 1644    Tilden Fossa, MD 02-19-20 2325

## 2020-01-26 NOTE — ED Triage Notes (Signed)
Per family pt has had n/v/d since before christmas.  Reports it was better now having headache, sob, diarrhea again and not eating.  Hx of afib.

## 2020-01-26 NOTE — Progress Notes (Signed)
Patient with h/o CAD, HTN, and DM presenting with hypoxia due to COVID-19 PNA.  2 vaccines, unboosted.  Initial GI symptoms 2 weeks ago, resolved.  Diarrhea recurred yesterday with SOB, cough.  Hypoxia as low as 87%, now on 2L.  CXR with multifocal PNA.  Ordered steroids and remdesivir.  Will admit to telemetry.  Georgana Curio, M.D.

## 2020-01-26 NOTE — ED Notes (Signed)
Patient assessed for need for MDI. BBS slight crackles in bases, but no wheezing. I don't feel like it would be beneficial at the moment. Will order MDI prn

## 2020-01-27 ENCOUNTER — Encounter (HOSPITAL_COMMUNITY): Payer: Self-pay | Admitting: Internal Medicine

## 2020-01-27 DIAGNOSIS — Y92230 Patient room in hospital as the place of occurrence of the external cause: Secondary | ICD-10-CM | POA: Diagnosis not present

## 2020-01-27 DIAGNOSIS — I2510 Atherosclerotic heart disease of native coronary artery without angina pectoris: Secondary | ICD-10-CM | POA: Diagnosis present

## 2020-01-27 DIAGNOSIS — J1282 Pneumonia due to coronavirus disease 2019: Secondary | ICD-10-CM

## 2020-01-27 DIAGNOSIS — D638 Anemia in other chronic diseases classified elsewhere: Secondary | ICD-10-CM | POA: Diagnosis present

## 2020-01-27 DIAGNOSIS — U071 COVID-19: Secondary | ICD-10-CM

## 2020-01-27 DIAGNOSIS — I480 Paroxysmal atrial fibrillation: Secondary | ICD-10-CM | POA: Diagnosis present

## 2020-01-27 DIAGNOSIS — D696 Thrombocytopenia, unspecified: Secondary | ICD-10-CM | POA: Diagnosis not present

## 2020-01-27 DIAGNOSIS — E669 Obesity, unspecified: Secondary | ICD-10-CM | POA: Diagnosis present

## 2020-01-27 DIAGNOSIS — J80 Acute respiratory distress syndrome: Secondary | ICD-10-CM | POA: Diagnosis not present

## 2020-01-27 DIAGNOSIS — Z89421 Acquired absence of other right toe(s): Secondary | ICD-10-CM | POA: Diagnosis not present

## 2020-01-27 DIAGNOSIS — R000 Tachycardia, unspecified: Secondary | ICD-10-CM | POA: Diagnosis not present

## 2020-01-27 DIAGNOSIS — D6959 Other secondary thrombocytopenia: Secondary | ICD-10-CM | POA: Diagnosis not present

## 2020-01-27 DIAGNOSIS — R0902 Hypoxemia: Secondary | ICD-10-CM | POA: Diagnosis not present

## 2020-01-27 DIAGNOSIS — E1151 Type 2 diabetes mellitus with diabetic peripheral angiopathy without gangrene: Secondary | ICD-10-CM | POA: Diagnosis present

## 2020-01-27 DIAGNOSIS — R0602 Shortness of breath: Secondary | ICD-10-CM | POA: Diagnosis present

## 2020-01-27 DIAGNOSIS — E1159 Type 2 diabetes mellitus with other circulatory complications: Secondary | ICD-10-CM | POA: Diagnosis present

## 2020-01-27 DIAGNOSIS — L8990 Pressure ulcer of unspecified site, unspecified stage: Secondary | ICD-10-CM | POA: Diagnosis not present

## 2020-01-27 DIAGNOSIS — J15211 Pneumonia due to Methicillin susceptible Staphylococcus aureus: Secondary | ICD-10-CM | POA: Diagnosis not present

## 2020-01-27 DIAGNOSIS — L89156 Pressure-induced deep tissue damage of sacral region: Secondary | ICD-10-CM | POA: Diagnosis present

## 2020-01-27 DIAGNOSIS — J189 Pneumonia, unspecified organism: Secondary | ICD-10-CM | POA: Diagnosis not present

## 2020-01-27 DIAGNOSIS — R6521 Severe sepsis with septic shock: Secondary | ICD-10-CM | POA: Diagnosis not present

## 2020-01-27 DIAGNOSIS — Z515 Encounter for palliative care: Secondary | ICD-10-CM | POA: Diagnosis not present

## 2020-01-27 DIAGNOSIS — R7881 Bacteremia: Secondary | ICD-10-CM | POA: Diagnosis not present

## 2020-01-27 DIAGNOSIS — J069 Acute upper respiratory infection, unspecified: Secondary | ICD-10-CM | POA: Diagnosis not present

## 2020-01-27 DIAGNOSIS — I5032 Chronic diastolic (congestive) heart failure: Secondary | ICD-10-CM | POA: Diagnosis present

## 2020-01-27 DIAGNOSIS — M069 Rheumatoid arthritis, unspecified: Secondary | ICD-10-CM

## 2020-01-27 DIAGNOSIS — E1165 Type 2 diabetes mellitus with hyperglycemia: Secondary | ICD-10-CM | POA: Diagnosis present

## 2020-01-27 DIAGNOSIS — M546 Pain in thoracic spine: Secondary | ICD-10-CM | POA: Diagnosis not present

## 2020-01-27 DIAGNOSIS — R0603 Acute respiratory distress: Secondary | ICD-10-CM | POA: Diagnosis not present

## 2020-01-27 DIAGNOSIS — B9561 Methicillin susceptible Staphylococcus aureus infection as the cause of diseases classified elsewhere: Secondary | ICD-10-CM | POA: Diagnosis not present

## 2020-01-27 DIAGNOSIS — G062 Extradural and subdural abscess, unspecified: Secondary | ICD-10-CM | POA: Diagnosis not present

## 2020-01-27 DIAGNOSIS — M542 Cervicalgia: Secondary | ICD-10-CM | POA: Diagnosis not present

## 2020-01-27 DIAGNOSIS — R531 Weakness: Secondary | ICD-10-CM | POA: Diagnosis not present

## 2020-01-27 DIAGNOSIS — J9601 Acute respiratory failure with hypoxia: Secondary | ICD-10-CM | POA: Diagnosis not present

## 2020-01-27 DIAGNOSIS — I4821 Permanent atrial fibrillation: Secondary | ICD-10-CM | POA: Diagnosis present

## 2020-01-27 DIAGNOSIS — Z66 Do not resuscitate: Secondary | ICD-10-CM | POA: Diagnosis not present

## 2020-01-27 DIAGNOSIS — I110 Hypertensive heart disease with heart failure: Secondary | ICD-10-CM | POA: Diagnosis present

## 2020-01-27 DIAGNOSIS — N170 Acute kidney failure with tubular necrosis: Secondary | ICD-10-CM | POA: Diagnosis not present

## 2020-01-27 DIAGNOSIS — E11649 Type 2 diabetes mellitus with hypoglycemia without coma: Secondary | ICD-10-CM | POA: Diagnosis not present

## 2020-01-27 DIAGNOSIS — A4101 Sepsis due to Methicillin susceptible Staphylococcus aureus: Secondary | ICD-10-CM | POA: Diagnosis not present

## 2020-01-27 DIAGNOSIS — G928 Other toxic encephalopathy: Secondary | ICD-10-CM | POA: Diagnosis not present

## 2020-01-27 DIAGNOSIS — E871 Hypo-osmolality and hyponatremia: Secondary | ICD-10-CM

## 2020-01-27 LAB — GLUCOSE, CAPILLARY
Glucose-Capillary: 183 mg/dL — ABNORMAL HIGH (ref 70–99)
Glucose-Capillary: 206 mg/dL — ABNORMAL HIGH (ref 70–99)
Glucose-Capillary: 267 mg/dL — ABNORMAL HIGH (ref 70–99)
Glucose-Capillary: 222 mg/dL — ABNORMAL HIGH (ref 70–99)

## 2020-01-27 LAB — BASIC METABOLIC PANEL
Anion gap: 12 (ref 5–15)
BUN: 14 mg/dL (ref 8–23)
CO2: 17 mmol/L — ABNORMAL LOW (ref 22–32)
Calcium: 7.7 mg/dL — ABNORMAL LOW (ref 8.9–10.3)
Chloride: 105 mmol/L (ref 98–111)
Creatinine, Ser: 0.77 mg/dL (ref 0.61–1.24)
GFR, Estimated: >60 mL/min (ref >60)
Glucose, Bld: 192 mg/dL — ABNORMAL HIGH (ref 70–99)
Potassium: 3.8 mmol/L (ref 3.5–5.1)
Sodium: 134 mmol/L — ABNORMAL LOW (ref 135–145)

## 2020-01-27 LAB — TROPONIN I (HIGH SENSITIVITY)
Troponin I (High Sensitivity): 9 ng/L (ref <18)
Troponin I (High Sensitivity): 9 ng/L (ref <18)

## 2020-01-27 LAB — PROCALCITONIN: Procalcitonin: 0.13 ng/mL

## 2020-01-27 MED ORDER — METHYLPREDNISOLONE SODIUM SUCC 125 MG IJ SOLR
0.5000 mg/kg | Freq: Two times a day (BID) | INTRAMUSCULAR | Status: AC
Start: 2020-01-27 — End: 2020-01-29
  Administered 2020-01-27 – 2020-01-29 (×6): 40.625 mg via INTRAVENOUS
  Filled 2020-01-27 (×6): qty 2

## 2020-01-27 MED ORDER — ACETAMINOPHEN 650 MG RE SUPP
650.0000 mg | Freq: Four times a day (QID) | RECTAL | Status: DC | PRN
  Administered 2020-01-30 – 2020-02-22 (×2): 650 mg via RECTAL
  Filled 2020-01-27: qty 1

## 2020-01-27 MED ORDER — INSULIN ASPART 100 UNIT/ML ~~LOC~~ SOLN
0.0000 [IU] | Freq: Three times a day (TID) | SUBCUTANEOUS | Status: DC
  Administered 2020-01-27: 2 [IU] via SUBCUTANEOUS
  Administered 2020-01-27: 5 [IU] via SUBCUTANEOUS
  Administered 2020-01-28 (×3): 3 [IU] via SUBCUTANEOUS
  Administered 2020-01-29: 1 [IU] via SUBCUTANEOUS
  Administered 2020-01-29: 2 [IU] via SUBCUTANEOUS
  Administered 2020-01-29: 3 [IU] via SUBCUTANEOUS
  Administered 2020-01-30 (×2): 1 [IU] via SUBCUTANEOUS
  Administered 2020-01-31 (×2): 2 [IU] via SUBCUTANEOUS
  Administered 2020-02-01: 1 [IU] via SUBCUTANEOUS
  Administered 2020-02-01: 3 [IU] via SUBCUTANEOUS
  Administered 2020-02-02: 2 [IU] via SUBCUTANEOUS
  Administered 2020-02-02 – 2020-02-03 (×3): 5 [IU] via SUBCUTANEOUS
  Administered 2020-02-03: 7 [IU] via SUBCUTANEOUS
  Administered 2020-02-03: 2 [IU] via SUBCUTANEOUS
  Administered 2020-02-04: 3 [IU] via SUBCUTANEOUS
  Administered 2020-02-04 (×2): 5 [IU] via SUBCUTANEOUS
  Administered 2020-02-05: 3 [IU] via SUBCUTANEOUS
  Administered 2020-02-05: 5 [IU] via SUBCUTANEOUS

## 2020-01-27 MED ORDER — CALCIUM CARBONATE 1250 (500 CA) MG PO TABS: 1.0000 | ORAL_TABLET | Freq: Every day | ORAL | Status: DC

## 2020-01-27 MED ORDER — CALCIUM CARBONATE 1250 (500 CA) MG PO TABS
0.5000 | ORAL_TABLET | Freq: Every day | ORAL | Status: DC
  Administered 2020-01-27 – 2020-02-15 (×18): 250 mg via ORAL
  Filled 2020-01-27 (×2): qty 1
  Filled 2020-01-27: qty 0.5
  Filled 2020-01-27 (×3): qty 1
  Filled 2020-01-27: qty 0.5
  Filled 2020-01-27 (×3): qty 1
  Filled 2020-01-27: qty 0.5
  Filled 2020-01-27 (×4): qty 1
  Filled 2020-01-27 (×2): qty 0.5
  Filled 2020-01-27: qty 1
  Filled 2020-01-27: qty 0.5
  Filled 2020-01-27: qty 1
  Filled 2020-01-27: qty 0.5

## 2020-01-27 MED ORDER — INSULIN GLARGINE 100 UNIT/ML ~~LOC~~ SOLN
10.0000 [IU] | Freq: Every day | SUBCUTANEOUS | Status: DC
  Administered 2020-01-27 – 2020-01-29 (×3): 10 [IU] via SUBCUTANEOUS
  Filled 2020-01-27 (×3): qty 0.1

## 2020-01-27 MED ORDER — PRAVASTATIN SODIUM 40 MG PO TABS
80.0000 mg | ORAL_TABLET | Freq: Every day | ORAL | Status: DC
  Administered 2020-01-27 – 2020-02-21 (×23): 80 mg via ORAL
  Filled 2020-01-27 (×24): qty 2

## 2020-01-27 MED ORDER — LOSARTAN POTASSIUM 50 MG PO TABS
50.0000 mg | ORAL_TABLET | Freq: Two times a day (BID) | ORAL | Status: DC
  Administered 2020-01-27 – 2020-02-03 (×15): 50 mg via ORAL
  Filled 2020-01-27 (×15): qty 1

## 2020-01-27 MED ORDER — VITAMIN B-12 1000 MCG PO TABS
1000.0000 ug | ORAL_TABLET | Freq: Every day | ORAL | Status: DC
  Administered 2020-01-27 – 2020-02-20 (×24): 1000 ug via ORAL
  Filled 2020-01-27 (×27): qty 1

## 2020-01-27 MED ORDER — PREDNISONE 5 MG PO TABS
50.0000 mg | ORAL_TABLET | Freq: Every day | ORAL | Status: DC
  Administered 2020-01-30 – 2020-01-31 (×2): 50 mg via ORAL
  Filled 2020-01-27: qty 2
  Filled 2020-01-27: qty 1

## 2020-01-27 MED ORDER — ASCORBIC ACID 500 MG PO TABS
500.0000 mg | ORAL_TABLET | Freq: Every day | ORAL | Status: DC
  Administered 2020-01-27 – 2020-02-20 (×24): 500 mg via ORAL
  Filled 2020-01-27 (×25): qty 1

## 2020-01-27 MED ORDER — ZINC SULFATE 220 (50 ZN) MG PO CAPS
220.0000 mg | ORAL_CAPSULE | Freq: Every day | ORAL | Status: DC
  Administered 2020-01-27 – 2020-02-20 (×24): 220 mg via ORAL
  Filled 2020-01-27 (×24): qty 1

## 2020-01-27 MED ORDER — HYDROCOD POLST-CPM POLST ER 10-8 MG/5ML PO SUER: 5.0000 mL | Freq: Two times a day (BID) | ORAL | Status: DC | PRN

## 2020-01-27 MED ORDER — FUROSEMIDE 40 MG PO TABS
60.0000 mg | ORAL_TABLET | Freq: Every day | ORAL | Status: DC
  Administered 2020-01-27 – 2020-02-04 (×9): 60 mg via ORAL
  Filled 2020-01-27 (×9): qty 1

## 2020-01-27 MED ORDER — PANTOPRAZOLE SODIUM 40 MG PO TBEC
40.0000 mg | DELAYED_RELEASE_TABLET | Freq: Every day | ORAL | Status: DC
  Administered 2020-01-27 – 2020-02-20 (×24): 40 mg via ORAL
  Filled 2020-01-27 (×25): qty 1

## 2020-01-27 MED ORDER — DOXAZOSIN MESYLATE 1 MG PO TABS
1.0000 mg | ORAL_TABLET | Freq: Every day | ORAL | Status: DC
  Administered 2020-01-27 – 2020-02-12 (×16): 1 mg via ORAL
  Filled 2020-01-27 (×18): qty 1

## 2020-01-27 MED ORDER — GUAIFENESIN-DM 100-10 MG/5ML PO SYRP
10.0000 mL | ORAL_SOLUTION | ORAL | Status: DC | PRN
  Administered 2020-01-27 – 2020-01-28 (×2): 10 mL via ORAL
  Filled 2020-01-27 (×3): qty 10

## 2020-01-27 MED ORDER — CALCIUM CARBONATE 1250 (500 CA) MG PO TABS
1250.0000 mg | ORAL_TABLET | Freq: Every day | ORAL | Status: DC
  Administered 2020-01-27 – 2020-02-20 (×20): 1250 mg via ORAL
  Filled 2020-01-27 (×23): qty 1

## 2020-01-27 MED ORDER — ACETAMINOPHEN 325 MG PO TABS
650.0000 mg | ORAL_TABLET | Freq: Four times a day (QID) | ORAL | Status: DC | PRN
  Administered 2020-01-27 – 2020-02-20 (×13): 650 mg via ORAL
  Filled 2020-01-27 (×15): qty 2

## 2020-01-27 MED ORDER — APIXABAN 5 MG PO TABS
5.0000 mg | ORAL_TABLET | Freq: Two times a day (BID) | ORAL | Status: DC
  Administered 2020-01-27 – 2020-02-21 (×51): 5 mg via ORAL
  Filled 2020-01-27 (×51): qty 1

## 2020-01-27 MED ORDER — HYDROXYCHLOROQUINE SULFATE 200 MG PO TABS
200.0000 mg | ORAL_TABLET | Freq: Two times a day (BID) | ORAL | Status: DC
  Administered 2020-01-27 – 2020-02-13 (×34): 200 mg via ORAL
  Filled 2020-01-27 (×35): qty 1

## 2020-01-27 MED ORDER — POTASSIUM CHLORIDE CRYS ER 10 MEQ PO TBCR
10.0000 meq | EXTENDED_RELEASE_TABLET | Freq: Two times a day (BID) | ORAL | Status: DC
  Administered 2020-01-27 – 2020-01-31 (×10): 10 meq via ORAL
  Filled 2020-01-27 (×10): qty 1

## 2020-01-27 MED ORDER — FOLIC ACID 1 MG PO TABS
1.0000 mg | ORAL_TABLET | Freq: Every day | ORAL | Status: DC
  Administered 2020-01-27 – 2020-02-20 (×24): 1 mg via ORAL
  Filled 2020-01-27 (×25): qty 1

## 2020-01-27 NOTE — Progress Notes (Addendum)
Received pt fro [facility] ED via [facility].  Pt AOx4, VSS with R25 at 2L O2 via [facility], O2 Sat=95%, denies pain, resting comfortably in bed.  Text paged [facility] admission of patient's arrival to unit.  [facility] called back informing this RN that Dr. Toniann Fail will be the attending MD and will see the patient on the floor.

## 2020-01-27 NOTE — Progress Notes (Addendum)
PROGRESS NOTE  Javier Beard FIE:[id number] DOB: 02/02/1940 DOA: February 07, 2020 PCP: Lenox Ponds, MD  Brief History   80 year old man PMH including rheumatoid arthritis on DMARD, diabetes mellitus type 2, permanent atrial fibrillation, chronic bilateral lower extremity edema presented with increasing shortness of breath, nausea, vomiting and diarrhea.  Symptoms for some time.  Low-grade temp in the emergency department.  Chest x-ray showed bilateral infiltrates, Covid test positive.  Admitted for acute hypoxic respiratory failure secondary to Covid pneumonia.  A & P  Acute hypoxic respiratory failure secondary to Covid pneumonia w/ N/V/D --Appears stable, mild respiratory symptoms, oxygenation in the 90s on 2 L without apparent change.  We will continue steroid and remdesivir.    . CXR on admit: Multifocal opacities suspicious for multifocal pneumonia . Fever: low grade . Oxygen requirement: 2L . Antibiotics: none . Remdesivir: 1/7 > . Steroids: 1/8 > . Actemra/baricitinib: relatively contraindicated based on chronic immunosuppression as below; not indicated presently, regardless . Vitamin C and Zinc . Proning: recommended as tolerated   Inflammatory markers: . CRP: 1.5 >  . D-dimer: 2.01 >  . LDH: 280 > . Ferritin 503 > . Procalcitonin: .13 >  Rheumatoid arthritis on Plaquenil, methotrexate and Simponi --Hold methotrexate for now.  Continue Plaquenil.  Hyponatremia --Repeat BMP today.  Suspect secondary to nausea, vomiting and diarrhea as an outpatient.  Appears asymptomatic. --Trial IV fluids.  BMP in a.m.  Diabetes mellitus type 2 --CBG appears stable.  Continue sliding scale insulin and continue long-acting insulin while on steroids.  Permanent atrial fibrillation on apixaban.  Not on rate control agent secondary to history of symptomatic bradycardia. --Stable.  Continue apixaban.  Bilateral chronic lower extremity edema without history of CHF.  Followed by cardiology  as an outpatient. --supportive care (see note 11/27/19)   Disposition Plan:  Discussion: as above  Status is: Inpatient  Remains inpatient appropriate because:IV treatments appropriate due to intensity of illness or inability to take PO and Inpatient level of care appropriate due to severity of illness   Dispo: The patient is from: Home              Anticipated d/c is to: Home              Anticipated d/c date is: 2 days              Patient currently is not medically stable to d/c.  DVT prophylaxis:  apixaban (ELIQUIS) tablet 5 mg    Code Status: Full Code Family Communication: none  Brendia Sacks, MD  [facility] Direct contact: see [email or web address] (further directions at bottom of note if needed) 7PM-7AM contact night coverage as at bottom of note 01/27/2020, 2:00 PM  LOS: 0 days   Significant Hospital Events   .    Consults:  .    Procedures:  .   Significant Diagnostic Tests:  Marland Kitchen    Micro Data:  .    Antimicrobials:  .   Interval History/Subjective  CC: f/u SOB  Still feels SOB, reports left low back pain for a few weeks. No fall reported.  Objective   Vitals:  Vitals:   01/27/20 0825 01/27/20 1204  BP: 117/76 108/61  Pulse: 81 91  Resp: 18 18  Temp: (!) 97.5 F (36.4 C) (!) 97.5 F (36.4 C)  SpO2: 98% 92%    Exam:  Constitutional:   . Appears calm and comfortable ENMT:  . grossly normal hearing  Respiratory:  . CTA bilaterally, no w/r/r.  Marland Kitchen  Respiratory effort mildly increased. Able to speak in full sentences. Cardiovascular:  . RRR, no m/r/g . No LE extremity edema   Musculoskeletal:  . Marked MCP joint deformities bilaterally Psychiatric:  . Mental status o Mood, affect appropriate  I have personally reviewed the following:   Today's Data  . CBG stable . troponins negative . Na+ 128 on admit, no repeat today . Lactic acid WNL  Scheduled Meds: . apixaban  5 mg Oral BID  . vitamin C  500 mg Oral Daily  . calcium  carbonate  1,250 mg Oral Q breakfast  . calcium carbonate  0.5 tablet Oral Q supper  . doxazosin  1 mg Oral QHS  . folic acid  1 mg Oral Daily  . furosemide  60 mg Oral Daily  . hydroxychloroquine  200 mg Oral BID  . insulin aspart  0-9 Units Subcutaneous TID WC  . insulin glargine  10 Units Subcutaneous Daily  . losartan  50 mg Oral BID  . methylPREDNISolone (SOLU-MEDROL) injection  0.5 mg/kg Intravenous Q12H   Followed by  . [START ON 01/30/2020] predniSONE  50 mg Oral Daily  . pantoprazole  40 mg Oral Daily  . potassium chloride  10 mEq Oral BID  . pravastatin  80 mg Oral q1800  . vitamin B-12  1,000 mcg Oral Daily  . zinc sulfate  220 mg Oral Daily   Continuous Infusions: . remdesivir 100 mg in NS 100 mL      Principal Problem:   Acute hypoxemic respiratory failure due to COVID-19 [facility]) Active Problems:   Type 2 diabetes mellitus with vascular disease (HCC)   Rheumatoid arthritis (HCC)   PAF (paroxysmal atrial fibrillation) (HCC)   CAD (coronary artery disease)   Pneumonia due to COVID-19 virus   Hyponatremia   LOS: 0 days   How to contact the [facility] Attending or Consulting provider 7A - 7P or covering provider during after hours 7P -7A, for this patient?  1. Check the care team in [facility] and look for a) attending/consulting TR[facility] provider listed and b) the [facility] team listed 2. Log into [email or web address] and use [facility]'s universal password to access. If you do not have the password, please contact the hospital operator. 3. Locate the [facility] provider you are looking for under [facility] and page to a number that you can be directly reached. 4. If you still have difficulty reaching the provider, please page the [facility] (Director on Call) for the Hospitalists listed on amion for assistance.

## 2020-01-27 NOTE — Hospital Course (Addendum)
80 year old man PMH including rheumatoid arthritis on DMARD, diabetes mellitus type 2, permanent atrial fibrillation, chronic bilateral lower extremity edema presented with increasing shortness of breath, nausea, vomiting and diarrhea.  Symptoms for some time.  Low-grade temp in the emergency department.  Chest x-ray showed bilateral infiltrates, Covid test positive.  Admitted for acute hypoxic respiratory failure secondary to Covid pneumonia.  Initially oxygenation was stable at 2 to 3 L but now on 1/11.  Inflammatory markers are trending down and clinically he appears to be improving.  Desaturated with ambulation with physical therapy.  I do not think he is significantly worsening but given modest increase in oxygen will observe overnight.  If oxygenation remains stable 1/12 can likely go home with home health.  Oxygen has been arranged.  A & P  Acute hypoxic respiratory failure secondary to Covid pneumonia w/ N/V/D --Clinically improved, inflammatory markers improving, although oxygen now at 4 L from 3 yesterday.  Desats with ambulation.  Will monitor an additional day to ensure no decompensation.  If stable, home 1/12.  CXR on admit: Multifocal opacities suspicious for multifocal pneumonia Fever: None Oxygen requirement: 4 L Antibiotics: none Remdesivir: 1/7 > 1/11 Steroids: 1/8 > Actemra/baricitinib: relatively contraindicated based on chronic immunosuppression as below; not indicated presently, regardless Vitamin C and Zinc Proning: Patient refuses   Inflammatory markers: CRP: 17.6 > 11.8 > 6 > 2.3 D-dimer: 2.01 > 1.8 > 1.49 > 1.41  Rheumatoid arthritis on Plaquenil, methotrexate and Simponi --Continue to hold methotrexate.  Continue Plaquenil.  Hyponatremia --Mild.  Expect spontaneous resolution  Secondary to nausea, vomiting and diarrhea as an outpatient.  Diabetes mellitus type 2 --CBG remains stable.  Continue sliding scale insulin, glipizide. Stopped Lantus  Permanent atrial  fibrillation on apixaban.  Not on rate control agent secondary to history of symptomatic bradycardia. --Stable.  Will continue apixaban.  Bilateral chronic lower extremity edema without history of CHF.  Followed by cardiology as an outpatient. --supportive care (see note 11/27/19)

## 2020-01-27 NOTE — Progress Notes (Signed)
Pt is alert and oriented X4, complains of no pain or shortness of breath. Not much of an appetite but stays hydrated. Refused to get up to the chair today but said he would try tomorrow. Pretty consistent with incentive spirometer, reaching about an average of 750 ml. Daughter has been updated and was able to speak to pt on the phone. Very cooperative and agreeable.

## 2020-01-27 NOTE — H&P (Signed)
History and Physical    Javier Beard ZDG:[id number] DOB: 06-Nov-1940 DOA: 2020-02-25  PCP: Lenox Ponds, MD  Patient coming from: Home.  Chief Complaint: Shortness of breath.  HPI: Javier Beard is a 80 y.o. male with history of CAD status post stenting, permanent atrial fibrillation, history of bradycardia, hypertension, diabetes mellitus, bilateral lower extremity edema has been experiencing fatigue weakness over the last week and a half and over the last 3 days has been having increasing shortness of breath.  Patient also had some nausea vomiting mid back pain and diarrhea which has resolved at this time.  Given the symptoms patient presents to the ER.  ED Course: In the ER patient was febrile with temperature 100.4 F hypoxic requiring 2 L oxygen with chest x-ray showing bilateral infiltrates concerning for pneumonia and Covid test was positive.  Labs are significant for hyponatremia with sodium of 129 CRP was 17.6 D-dimer 2.01 anemia with hemoglobin of 11.7.  Patient was started on steroids and remdesivir admitted for Covid infection.  Review of Systems: As per HPI, rest all negative.   Past Medical History:  Diagnosis Date   Arthritis    Coronary artery disease    Diabetes mellitus    Hypertension     Past Surgical History:  Procedure Laterality Date   CARDIAC CATHETERIZATION     CORONARY ANGIOPLASTY     HIP SURGERY     HIP SURGERY       reports that he has quit smoking. He has never used smokeless tobacco. He reports that he does not drink alcohol and does not use drugs.  No Known Allergies  Family History  Problem Relation Age of Onset   Diabetes Mellitus II Maternal Grandmother     Prior to Admission medications   Medication Sig Start Date End Date Taking? Authorizing Provider  acetaminophen (TYLENOL) 650 MG CR tablet Take 650 mg by mouth daily. 08/27/15  Yes [provider]  calcium carbonate (OSCAL) 1500 (600 Ca) MG TABS tablet Take  600-1,200 mg by mouth See admin instructions. Take 1200mg  by mouth in the morning and 600mg  at bedtime. 09/30/15  Yes [provider]  denosumab (PROLIA) 60 MG/ML SOSY injection Inject 60 mg into the skin every 6 (six) months.   Yes [provider]  diclofenac Sodium (VOLTAREN) 1 % GEL Apply 1 application topically daily as needed for pain. 02/25/15  Yes [provider]  doxazosin (CARDURA) 1 MG tablet Take 1 mg by mouth at bedtime. 01/15/20  Yes [provider]  ELIQUIS 5 MG TABS tablet Take 5 mg by mouth 2 (two) times daily. 12/18/19  Yes [provider]  ergocalciferol (VITAMIN D2) 1.25 MG (50000 UT) capsule Take 1 capsule by mouth every 14 (fourteen) days. 09/27/19  Yes [provider]  folic acid (FOLVITE) 1 MG tablet Take 1 tablet by mouth daily. 11/15/19  Yes [provider]  furosemide (LASIX) 20 MG tablet Take 60 mg by mouth daily. 01/01/20  Yes [provider]  glipiZIDE (GLUCOTROL) 5 MG tablet Take 5 mg by mouth daily. 01/01/20  Yes [provider]  golimumab (SIMPONI ARIA) 50 MG/4ML SOLN injection Inject 12.5 mg into the vein every 8 (eight) weeks.   Yes [provider]  hydroxychloroquine (PLAQUENIL) 200 MG tablet Take 200 mg by mouth 2 (two) times daily. 01/01/20  Yes [provider]  losartan (COZAAR) 50 MG tablet Take 50 mg by mouth 2 (two) times daily. 11/13/19  Yes [provider]  methotrexate 2.5 MG tablet Take 25 mg by mouth once a week. 01/01/20  Yes [provider]  pantoprazole (PROTONIX) 40 MG tablet Take 40 mg by mouth daily. 01/01/20  Yes [provider]  potassium chloride (KLOR-CON) 10 MEQ tablet Take 10 mEq by mouth 2 (two) times daily.   Yes [provider]  pravastatin (PRAVACHOL) 80 MG tablet Take 80 mg by mouth daily.   Yes [provider]  predniSONE (DELTASONE) 5 MG tablet Take 5 mg by mouth daily. Take with food or milk.   Yes  [provider]  vitamin B-12 (CYANOCOBALAMIN) 1000 MCG tablet Take 1,000 mcg by mouth daily.   Yes [provider]    Physical Exam: Constitutional: Moderately built and nourished. Vitals:   02/19/20 2200 2020/02/19 2300 01/27/20 0000 01/27/20 0156  BP: (!) 126/59 115/69 133/75 128/79  Pulse: 78 76 76 79  Resp: (!) 26 (!) 29 (!) 25 (!) 25  Temp:  99.1 F (37.3 C)  98.1 F (36.7 C)  TempSrc:  Oral    SpO2: 96% 93% 94% 95%  Weight:      Height:       Eyes: Anicteric no pallor. ENMT: No discharge from the ears eyes nose or mouth. Neck: No mass felt.  No neck rigidity. Respiratory: No rhonchi or crepitations. Cardiovascular: S1-S2 heard. Abdomen: Soft nontender bowel sounds present. Musculoskeletal: No edema. Skin: No rash. Neurologic: Alert awake oriented to time place and person.  Moves all extremities. Psychiatric: Appears normal.  Normal affect.   Labs on Admission: I have personally reviewed following labs and imaging studies  CBC: Recent Labs  Lab 02/19/20 1356  WBC 3.7*  HGB 11.7*  HCT 34.4*  MCV 93.2  PLT 195   Basic Metabolic Panel: Recent Labs  Lab 19-Feb-2020 1356 2020-02-19 1611  NA 129* 128*  K 3.7 3.7  CL 96* 98  CO2 19* 19*  GLUCOSE 127* 119*  BUN 13 13  CREATININE 1.05 0.85  CALCIUM 7.9* 7.5*   GFR: Estimated Creatinine Clearance: 73 mL/min (by C-G formula based on SCr of 0.85 mg/dL). Liver Function Tests: Recent Labs  Lab 19-Feb-2020 1356 2020/02/19 1611  AST 37 30  ALT 18 18  ALKPHOS 31* 31*  BILITOT 1.1 0.9  PROT 7.0 5.7*  ALBUMIN 3.4* 2.8*   Recent Labs  Lab February 19, 2020 1356  LIPASE 46   No results for input(s): AMMONIA in the last 168 hours. Coagulation Profile: No results for input(s): INR, PROTIME in the last 168 hours. Cardiac Enzymes: No results for input(s): CKTOTAL, CKMB, CKMBINDEX, TROPONINI in the last 168 hours. BNP (last 3 results) No results for input(s): PROBNP in the last 8760 hours. HbA1C: No  results for input(s): HGBA1C in the last 72 hours. CBG: No results for input(s): GLUCAP in the last 168 hours. Lipid Profile: Recent Labs    February 19, 2020 1611  TRIG 67   Thyroid Function Tests: No results for input(s): TSH, T4TOTAL, FREET4, T3FREE, THYROIDAB in the last 72 hours. Anemia Panel: Recent Labs    February 19, 2020 1611  FERRITIN 503*   Urine analysis:    Component Value Date/Time   COLORURINE YELLOW 2020/02/19 1925   APPEARANCEUR CLEAR 2020-02-19 1925   LABSPEC 1.015 02/19/20 1925   PHURINE 5.0 2020-02-19 1925   GLUCOSEU NEGATIVE 02-19-20 1925   HGBUR NEGATIVE 2020/02/19 1925   BILIRUBINUR NEGATIVE 02/19/2020 1925   KETONESUR 15 (A) 19-Feb-2020 1925   PROTEINUR NEGATIVE February 19, 2020 1925   NITRITE NEGATIVE 19-Feb-2020  1925   LEUKOCYTESUR NEGATIVE 02/12/2020 1925   Sepsis Labs: @LABRCNTIP (procalcitonin:4,lacticidven:4) ) Recent Results (from the past 240 hour(s))  Resp Panel by RT-PCR (Flu A&B, Covid) Nasopharyngeal Swab     Status: Abnormal   Collection Time: 02/08/2020  1:42 PM   Specimen: Nasopharyngeal Swab; Nasopharyngeal(NP) swabs in vial transport medium  Result Value Ref Range Status   SARS Coronavirus 2 by RT PCR POSITIVE (A) NEGATIVE Final    Comment: RESULT CALLED TO, READ BACK BY AND VERIFIED WITH: SIMMS.M,RN @ 1547 ON 02/04/2020, [email or web address].P (NOTE) SARS-CoV-2 target nucleic acids are DETECTED.  The SARS-CoV-2 RNA is generally detectable in upper respiratory specimens during the acute phase of infection. Positive results are indicative of the presence of the identified virus, but do not rule out bacterial infection or co-infection with other pathogens not detected by the test. Clinical correlation with patient history and other diagnostic information is necessary to determine patient infection status. The expected result is Negative.  Fact Sheet for Patients: 03/25/2020  Fact Sheet for Healthcare  Providers: BloggerCourse.com  This test is not yet approved or cleared by the [employer] FDA and  has been authorized for detection and/or diagnosis of SARS-CoV-2 by FDA under an Emergency Use Authorization (EUA).  This EUA will remain in effect (meaning this  test can be used) for the duration of  the COVID-19 declaration under Section 564(b)(1) of the Act, 21 U.S.C. section 360bbb-3(b)(1), unless the authorization is terminated or revoked sooner.     Influenza A by PCR NEGATIVE NEGATIVE Final   Influenza B by PCR NEGATIVE NEGATIVE Final    Comment: (NOTE) The Xpert Xpress SARS-CoV-2/FLU/RSV plus assay is intended as an aid in the diagnosis of influenza from Nasopharyngeal swab specimens and should not be used as a sole basis for treatment. Nasal washings and aspirates are unacceptable for Xpert Xpress SARS-CoV-2/FLU/RSV testing.  Fact Sheet for Patients: [place]  Fact Sheet for Healthcare Providers: BloggerCourse.com  This test is not yet approved or cleared by the [employer] FDA and has been authorized for detection and/or diagnosis of SARS-CoV-2 by FDA under an Emergency Use Authorization (EUA). This EUA will remain in effect (meaning this test can be used) for the duration of the COVID-19 declaration under Section 564(b)(1) of the Act, 21 U.S.C. section 360bbb-3(b)(1), unless the authorization is terminated or revoked.  Performed at [facility], [address] [place], [place]      Radiological Exams on Admission: DG Chest 2 View  Result Date: 02/05/2020 CLINICAL DATA:  Shortness of breath, cough. EXAM: CHEST - 2 VIEW COMPARISON:  April 28, 2019 and February 03, 2019 FINDINGS: Cardiac enlargement similar to the prior study. Eventration of the RIGHT hemidiaphragm is also similar. Large hiatal hernia behind the heart. Patchy opacities in the bilateral chest with  basilar and mid lung predominance. No lobar consolidative changes. No sign of pleural effusion. On limited assessment no acute skeletal process. IMPRESSION: 1. Multifocal opacities suspicious for multifocal pneumonia, consider COVID 19 infection. 2. Large hiatal hernia. 3. Cardiomegaly as before. Electronically Signed   By: February 05, 2019 M.D.   On: 01/24/2020 14:33    EKG: Independently reviewed.  A. fib rate controlled.  Assessment/Plan Principal Problem:   Acute respiratory disease due to COVID-19 virus Active Problems:   Type 2 diabetes mellitus with vascular disease (HCC)   Rheumatoid arthritis (HCC)   PAF (paroxysmal atrial fibrillation) (HCC)   CAD (coronary artery disease)   Acute respiratory failure due to COVID-19 (  HCC)    1. Acute respiratory failure with hypoxia secondary to Covid pneumonia presently on 2 L oxygen will continue with IV steroid remdesivir closely follow respiratory status and inflammatory markers. 2. A. fib rate controlled not on any rate limiting medication and reviewing cardiology notes in care everywhere patient used to have symptomatic bradycardia and patient's metoprolol was recently discontinued.  On apixaban. 3. CAD status post stenting denies any chest pain.  On apixaban and statins. 4. Rheumatoid arthritis on Plaquenil and takes methotrexate weekly.  On Simponi. 5. Anemia likely from chronic disease follow CBC. 6. Diabetes mellitus type 2 -since patient is on steroids we will keep patient on Lantus 10 units with sliding scale coverage. 7. Mid back pain cause not clear.  Will check x-ray T-spine. 8. Chronic bilateral lower extremity edema being treated by cardiologist with Lasix.  Per cardiology patient does not have any CHF. 9. Hyponatremia could be from dehydration from diarrhea.  Follow metabolic panel.  Since patient has acute respiratory failure with Covid infection will need inpatient status.   DVT prophylaxis: Apixaban. Code Status: Full  code. Family Communication: Discussed with patient. Disposition Plan: Home. Consults called: None. Admission status: Inpatient.   Eduard Clos MD [facility] Pager [phone].  If 7PM-7AM, please contact night-coverage [email or web address] Password [facility]  01/27/2020, 3:58 AM

## 2020-01-28 DIAGNOSIS — J189 Pneumonia, unspecified organism: Secondary | ICD-10-CM | POA: Diagnosis not present

## 2020-01-28 DIAGNOSIS — U071 COVID-19: Secondary | ICD-10-CM | POA: Diagnosis not present

## 2020-01-28 DIAGNOSIS — M069 Rheumatoid arthritis, unspecified: Secondary | ICD-10-CM | POA: Diagnosis not present

## 2020-01-28 DIAGNOSIS — I480 Paroxysmal atrial fibrillation: Secondary | ICD-10-CM | POA: Diagnosis not present

## 2020-01-28 LAB — CBC WITH DIFFERENTIAL/PLATELET
Abs Immature Granulocytes: 0.02 10*3/uL (ref 0.00–0.07)
Basophils Absolute: 0 10*3/uL (ref 0.0–0.1)
Basophils Relative: 0 %
Eosinophils Absolute: 0 10*3/uL (ref 0.0–0.5)
Eosinophils Relative: 0 %
HCT: 31.7 % — ABNORMAL LOW (ref 39.0–52.0)
Hemoglobin: 10.4 g/dL — ABNORMAL LOW (ref 13.0–17.0)
Immature Granulocytes: 0 %
Lymphocytes Relative: 8 %
Lymphs Abs: 0.4 10*3/uL — ABNORMAL LOW (ref 0.7–4.0)
MCH: 30.8 pg (ref 26.0–34.0)
MCHC: 32.8 g/dL (ref 30.0–36.0)
MCV: 93.8 fL (ref 80.0–100.0)
Monocytes Absolute: 0.2 10*3/uL (ref 0.1–1.0)
Monocytes Relative: 4 %
Neutro Abs: 4.7 10*3/uL (ref 1.7–7.7)
Neutrophils Relative %: 88 %
Platelets: 195 10*3/uL (ref 150–400)
RBC: 3.38 MIL/uL — ABNORMAL LOW (ref 4.22–5.81)
RDW: 14.8 % (ref 11.5–15.5)
WBC: 5.4 10*3/uL (ref 4.0–10.5)
nRBC: 0 % (ref 0.0–0.2)

## 2020-01-28 LAB — COMPREHENSIVE METABOLIC PANEL
ALT: 21 U/L (ref 0–44)
AST: 27 U/L (ref 15–41)
Albumin: 2.5 g/dL — ABNORMAL LOW (ref 3.5–5.0)
Alkaline Phosphatase: 27 U/L — ABNORMAL LOW (ref 38–126)
Anion gap: 9 (ref 5–15)
BUN: 24 mg/dL — ABNORMAL HIGH (ref 8–23)
CO2: 18 mmol/L — ABNORMAL LOW (ref 22–32)
Calcium: 7.8 mg/dL — ABNORMAL LOW (ref 8.9–10.3)
Chloride: 107 mmol/L (ref 98–111)
Creatinine, Ser: 0.97 mg/dL (ref 0.61–1.24)
GFR, Estimated: >60 mL/min (ref >60)
Glucose, Bld: 252 mg/dL — ABNORMAL HIGH (ref 70–99)
Potassium: 3.9 mmol/L (ref 3.5–5.1)
Sodium: 134 mmol/L — ABNORMAL LOW (ref 135–145)
Total Bilirubin: 0.6 mg/dL (ref 0.3–1.2)
Total Protein: 5.6 g/dL — ABNORMAL LOW (ref 6.5–8.1)

## 2020-01-28 LAB — C-REACTIVE PROTEIN: CRP: 11.8 mg/dL — ABNORMAL HIGH (ref <1.0)

## 2020-01-28 LAB — D-DIMER, QUANTITATIVE: D-Dimer, Quant: 1.8 ug/mL-FEU — ABNORMAL HIGH (ref 0.00–0.50)

## 2020-01-28 LAB — GLUCOSE, CAPILLARY
Glucose-Capillary: 201 mg/dL — ABNORMAL HIGH (ref 70–99)
Glucose-Capillary: 249 mg/dL — ABNORMAL HIGH (ref 70–99)
Glucose-Capillary: 225 mg/dL — ABNORMAL HIGH (ref 70–99)
Glucose-Capillary: 185 mg/dL — ABNORMAL HIGH (ref 70–99)

## 2020-01-28 MED ORDER — INSULIN ASPART 100 UNIT/ML ~~LOC~~ SOLN
3.0000 [IU] | Freq: Three times a day (TID) | SUBCUTANEOUS | Status: DC
  Administered 2020-01-28 – 2020-01-30 (×5): 3 [IU] via SUBCUTANEOUS

## 2020-01-28 MED ORDER — GLIPIZIDE 5 MG PO TABS
5.0000 mg | ORAL_TABLET | Freq: Every day | ORAL | Status: DC
  Administered 2020-01-29 – 2020-01-30 (×2): 5 mg via ORAL
  Filled 2020-01-28 (×2): qty 1

## 2020-01-28 NOTE — Progress Notes (Signed)
PROGRESS NOTE  Javier Beard [id number]:[id number] DOB: 10/20/1940 DOA: 02-16-20 PCP: Lenox Ponds, MD  Brief History   80 year old man PMH including rheumatoid arthritis on DMARD, diabetes mellitus type 2, permanent atrial fibrillation, chronic bilateral lower extremity edema presented with increasing shortness of breath, nausea, vomiting and diarrhea.  Symptoms for some time.  Low-grade temp in the emergency department.  Chest x-ray showed bilateral infiltrates, Covid test positive.  Admitted for acute hypoxic respiratory failure secondary to Covid pneumonia.  A & P  Acute hypoxic respiratory failure secondary to Covid pneumonia w/ N/V/D --Appears stable today with no change in oxygen requirement.  Inflammatory markers trending down.  Continue steroid and remdesivir, remains stable can likely discharge 1/10.  . CXR on admit: Multifocal opacities suspicious for multifocal pneumonia . Fever: low grade . Oxygen requirement: 2L . Antibiotics: none . Remdesivir: 1/7 > . Steroids: 1/8 > . Actemra/baricitinib: relatively contraindicated based on chronic immunosuppression as below; not indicated presently, regardless . Vitamin C and Zinc . Proning: Patient refuses   Inflammatory markers: . CRP: 17.6 > 11.8 . D-dimer: 2.01 > 1.8 . Procalcitonin: .13 >  Rheumatoid arthritis on Plaquenil, methotrexate and Simponi --Continue to hold methotrexate for now.  Continue Plaquenil.  Hyponatremia --Resolving with fluids.  Secondary to nausea, vomiting and diarrhea as an outpatient.  Diabetes mellitus type 2 --CBG running high on steroids.  Continue sliding scale insulin, Lantus and add meal coverage. Restart glipizide.  Permanent atrial fibrillation on apixaban.  Not on rate control agent secondary to history of symptomatic bradycardia. --Remains stable.  Continue apixaban.  Bilateral chronic lower extremity edema without history of CHF.  Followed by cardiology as an outpatient. --supportive  care (see note 11/27/19)   Disposition Plan:  Discussion: as above  Status is: Inpatient  Remains inpatient appropriate because:IV treatments appropriate due to intensity of illness or inability to take PO and Inpatient level of care appropriate due to severity of illness   Dispo: The patient is from: Home              Anticipated d/c is to: Home              Anticipated d/c date is: 2 days              Patient currently is not medically stable to d/c.  DVT prophylaxis:  apixaban (ELIQUIS) tablet 5 mg    Code Status: Full Code Family Communication: none  Brendia Sacks, MD  [facility] Direct contact: see [email or web address] (further directions at bottom of note if needed) 7PM-7AM contact night coverage as at bottom of note 01/28/2020, 3:22 PM  LOS: 1 day   Significant Hospital Events   .    Consults:  .    Procedures:  .   Significant Diagnostic Tests:  Marland Kitchen    Micro Data:  .    Antimicrobials:  .   Interval History/Subjective  CC: f/u SOB  Feels ok, breathing ok, no new issues noted.  Objective   Vitals:  Vitals:   01/28/20 0200 01/28/20 0941  BP: 121/74 (!) 137/94  Pulse: 80 82  Resp: 20 (!) 29  Temp: 97.7 F (36.5 C) 97.7 F (36.5 C)  SpO2: 95% 90%    Exam:  Constitutional:   . Appears calm and comfortable ENMT:  . grossly normal hearing  Respiratory:  . CTA bilaterally, no w/r/r.  . Respiratory effort normal.  Cardiovascular:  . RRR, no m/r/g Psychiatric:  . Mental status o Mood, affect appropriate  I have personally reviewed the following:   Today's Data  . CBG 200s . CMP noted . CBC stable  Scheduled Meds: . apixaban  5 mg Oral BID  . vitamin C  500 mg Oral Daily  . calcium carbonate  1,250 mg Oral Q breakfast  . calcium carbonate  0.5 tablet Oral Q supper  . doxazosin  1 mg Oral QHS  . folic acid  1 mg Oral Daily  . furosemide  60 mg Oral Daily  . [START ON 01/29/2020] glipiZIDE  5 mg Oral QAC breakfast  .  hydroxychloroquine  200 mg Oral BID  . insulin aspart  0-9 Units Subcutaneous TID WC  . insulin aspart  3 Units Subcutaneous TID WC  . insulin glargine  10 Units Subcutaneous Daily  . losartan  50 mg Oral BID  . methylPREDNISolone (SOLU-MEDROL) injection  0.5 mg/kg Intravenous Q12H   Followed by  . [START ON 01/30/2020] predniSONE  50 mg Oral Daily  . pantoprazole  40 mg Oral Daily  . potassium chloride  10 mEq Oral BID  . pravastatin  80 mg Oral q1800  . vitamin B-12  1,000 mcg Oral Daily  . zinc sulfate  220 mg Oral Daily   Continuous Infusions: . remdesivir 100 mg in NS 100 mL 100 mg (01/28/20 0929)    Principal Problem:   Acute hypoxemic respiratory failure due to COVID-19 [facility]) Active Problems:   Type 2 diabetes mellitus with vascular disease (HCC)   Rheumatoid arthritis (HCC)   PAF (paroxysmal atrial fibrillation) (HCC)   CAD (coronary artery disease)   Pneumonia due to COVID-19 virus   Hyponatremia   LOS: 1 day   How to contact the [facility] Attending or Consulting provider 7A - 7P or covering provider during after hours 7P -7A, for this patient?  1. Check the care team in [facility] and look for a) attending/consulting TRH provider listed and b) the [facility] team listed 2. Log into [email or web address] and use [place]'s universal password to access. If you do not have the password, please contact the hospital operator. 3. Locate the [facility] provider you are looking for under [facility] and page to a number that you can be directly reached. 4. If you still have difficulty reaching the provider, please page the [facility] (Director on Call) for the Hospitalists listed on amion for assistance.

## 2020-01-28 NOTE — Plan of Care (Signed)
  Problem: Education: Goal: Knowledge of risk factors and measures for prevention of condition will improve Outcome: Progressing   Problem: Coping: Goal: Psychosocial and spiritual needs will be supported Outcome: Progressing   Problem: Respiratory: Goal: Will maintain a patent airway Outcome: Progressing Goal: Complications related to the disease process, condition or treatment will be avoided or minimized Outcome: Progressing   

## 2020-01-29 DIAGNOSIS — J9601 Acute respiratory failure with hypoxia: Secondary | ICD-10-CM | POA: Diagnosis not present

## 2020-01-29 DIAGNOSIS — U071 COVID-19: Secondary | ICD-10-CM | POA: Diagnosis not present

## 2020-01-29 DIAGNOSIS — E1159 Type 2 diabetes mellitus with other circulatory complications: Secondary | ICD-10-CM | POA: Diagnosis not present

## 2020-01-29 DIAGNOSIS — E871 Hypo-osmolality and hyponatremia: Secondary | ICD-10-CM | POA: Diagnosis not present

## 2020-01-29 LAB — CBC WITH DIFFERENTIAL/PLATELET
Abs Immature Granulocytes: 0.05 10*3/uL (ref 0.00–0.07)
Basophils Absolute: 0 10*3/uL (ref 0.0–0.1)
Basophils Relative: 0 %
Eosinophils Absolute: 0 10*3/uL (ref 0.0–0.5)
Eosinophils Relative: 0 %
HCT: 31.4 % — ABNORMAL LOW (ref 39.0–52.0)
Hemoglobin: 10.6 g/dL — ABNORMAL LOW (ref 13.0–17.0)
Immature Granulocytes: 1 %
Lymphocytes Relative: 4 %
Lymphs Abs: 0.4 10*3/uL — ABNORMAL LOW (ref 0.7–4.0)
MCH: 31.3 pg (ref 26.0–34.0)
MCHC: 33.8 g/dL (ref 30.0–36.0)
MCV: 92.6 fL (ref 80.0–100.0)
Monocytes Absolute: 0.4 10*3/uL (ref 0.1–1.0)
Monocytes Relative: 6 %
Neutro Abs: 7.2 10*3/uL (ref 1.7–7.7)
Neutrophils Relative %: 89 %
Platelets: 229 10*3/uL (ref 150–400)
RBC: 3.39 MIL/uL — ABNORMAL LOW (ref 4.22–5.81)
RDW: 14.9 % (ref 11.5–15.5)
WBC: 8 10*3/uL (ref 4.0–10.5)
nRBC: 0 % (ref 0.0–0.2)

## 2020-01-29 LAB — GLUCOSE, CAPILLARY
Glucose-Capillary: 206 mg/dL — ABNORMAL HIGH (ref 70–99)
Glucose-Capillary: 186 mg/dL — ABNORMAL HIGH (ref 70–99)
Glucose-Capillary: 137 mg/dL — ABNORMAL HIGH (ref 70–99)
Glucose-Capillary: 110 mg/dL — ABNORMAL HIGH (ref 70–99)

## 2020-01-29 LAB — COMPREHENSIVE METABOLIC PANEL
ALT: 22 U/L (ref 0–44)
AST: 23 U/L (ref 15–41)
Albumin: 2.5 g/dL — ABNORMAL LOW (ref 3.5–5.0)
Alkaline Phosphatase: 32 U/L — ABNORMAL LOW (ref 38–126)
Anion gap: 10 (ref 5–15)
BUN: 31 mg/dL — ABNORMAL HIGH (ref 8–23)
CO2: 17 mmol/L — ABNORMAL LOW (ref 22–32)
Calcium: 7.9 mg/dL — ABNORMAL LOW (ref 8.9–10.3)
Chloride: 106 mmol/L (ref 98–111)
Creatinine, Ser: 1.06 mg/dL (ref 0.61–1.24)
GFR, Estimated: >60 mL/min (ref >60)
Glucose, Bld: 219 mg/dL — ABNORMAL HIGH (ref 70–99)
Potassium: 3.9 mmol/L (ref 3.5–5.1)
Sodium: 133 mmol/L — ABNORMAL LOW (ref 135–145)
Total Bilirubin: 0.5 mg/dL (ref 0.3–1.2)
Total Protein: 5.5 g/dL — ABNORMAL LOW (ref 6.5–8.1)

## 2020-01-29 LAB — D-DIMER, QUANTITATIVE: D-Dimer, Quant: 1.49 ug/mL-FEU — ABNORMAL HIGH (ref 0.00–0.50)

## 2020-01-29 LAB — C-REACTIVE PROTEIN: CRP: 6 mg/dL — ABNORMAL HIGH (ref <1.0)

## 2020-01-29 NOTE — Progress Notes (Signed)
SATURATION QUALIFICATIONS: (This note is used to comply with regulatory documentation for home oxygen)  Patient Saturations on Room Air at Rest = 81%  Patient Saturations on 4 Liters of oxygen while Ambulating = 90%  Please briefly explain why patient needs home oxygen:

## 2020-01-29 NOTE — Progress Notes (Signed)
PROGRESS NOTE  Javier Beard WUJ:[id number] DOB: July 27, 1940 DOA: 2020/02/13 PCP: Lenox Ponds, MD  Brief History   80 year old man PMH including rheumatoid arthritis on DMARD, diabetes mellitus type 2, permanent atrial fibrillation, chronic bilateral lower extremity edema presented with increasing shortness of breath, nausea, vomiting and diarrhea.  Symptoms for some time.  Low-grade temp in the emergency department.  Chest x-ray showed bilateral infiltrates, Covid test positive.  Admitted for acute hypoxic respiratory failure secondary to Covid pneumonia.  Seems to be stabilizing, likely home 1/11 with oxygen.  A & P  Acute hypoxic respiratory failure secondary to Covid pneumonia w/ N/V/D --Appears to be doing well, oxygen requirement slightly up from 2 to 3 L but clinically appears well.  Inflammatory markers trending down.  Continue steroid and remdesivir, likely home 1/11 on oxygen.  . CXR on admit: Multifocal opacities suspicious for multifocal pneumonia . Fever: None . Oxygen requirement: 2-3L . Antibiotics: none . Remdesivir: 1/7 > 1/11 . Steroids: 1/8 > . Actemra/baricitinib: relatively contraindicated based on chronic immunosuppression as below; not indicated presently, regardless . Vitamin C and Zinc . Proning: Patient refuses   Inflammatory markers: . CRP: 17.6 > 11.8 > 6 . D-dimer: 2.01 > 1.8 > 1.49 . Procalcitonin: .13   Rheumatoid arthritis on Plaquenil, methotrexate and Simponi --Continue to hold methotrexate.  Continue Plaquenil.  Hyponatremia --Mild.  Expect spontaneous resolution at this point.  Secondary to nausea, vomiting and diarrhea as an outpatient.  Diabetes mellitus type 2 --CBG stable.  Continue sliding scale insulin, Lantus, glipizide  Permanent atrial fibrillation on apixaban.  Not on rate control agent secondary to history of symptomatic bradycardia. --stable.  Will continue apixaban.  Bilateral chronic lower extremity edema without history of  CHF.  Followed by cardiology as an outpatient. --supportive care (see note 11/27/19)   Disposition Plan:  Discussion: Overall improving, anticipate discharge home tomorrow with oxygen.  Status is: Inpatient  Remains inpatient appropriate because:IV treatments appropriate due to intensity of illness or inability to take PO and Inpatient level of care appropriate due to severity of illness   Dispo: The patient is from: Home              Anticipated d/c is to: Home              Anticipated d/c date is: 1 day              Patient currently is not medically stable to d/c.  DVT prophylaxis:  apixaban (ELIQUIS) tablet 5 mg    Code Status: Full Code Family Communication: none  Brendia Sacks, MD  [facility] Direct contact: see [email or web address] (further directions at bottom of note if needed) 7PM-7AM contact night coverage as at bottom of note 01/29/2020, 5:51 PM  LOS: 2 days    Interval History/Subjective  CC: f/u SOB  Breathing okay, no complaints.  Objective   Vitals:  Vitals:   01/29/20 1517 01/29/20 1700  BP:    Pulse:  84  Resp:    Temp:    SpO2: 92% 92%    Exam:  Constitutional:   . Appears calm and comfortable ENMT:  . grossly normal hearing  Respiratory:  . CTA bilaterally, no w/r/r.  . Respiratory effort normal.  Cardiovascular:  . RRR, no m/r/g . No LE extremity edema   Psychiatric:  . Mental status o Mood, affect appropriate  I have personally reviewed the following:   Today's Data  . CBG stable . CMP noted, CRP down, CBC  stable  Scheduled Meds: . apixaban  5 mg Oral BID  . vitamin C  500 mg Oral Daily  . calcium carbonate  1,250 mg Oral Q breakfast  . calcium carbonate  0.5 tablet Oral Q supper  . doxazosin  1 mg Oral QHS  . folic acid  1 mg Oral Daily  . furosemide  60 mg Oral Daily  . glipiZIDE  5 mg Oral QAC breakfast  . hydroxychloroquine  200 mg Oral BID  . insulin aspart  0-9 Units Subcutaneous TID WC  . insulin aspart  3 Units  Subcutaneous TID WC  . losartan  50 mg Oral BID  . pantoprazole  40 mg Oral Daily  . potassium chloride  10 mEq Oral BID  . pravastatin  80 mg Oral q1800  . [START ON 01/30/2020] predniSONE  50 mg Oral Daily  . vitamin B-12  1,000 mcg Oral Daily  . zinc sulfate  220 mg Oral Daily   Continuous Infusions: . remdesivir 100 mg in NS 100 mL 100 mg (01/29/20 0910)    Principal Problem:   Acute hypoxemic respiratory failure due to COVID-19 [facility]) Active Problems:   Type 2 diabetes mellitus with vascular disease (HCC)   Rheumatoid arthritis (HCC)   PAF (paroxysmal atrial fibrillation) (HCC)   CAD (coronary artery disease)   Pneumonia due to COVID-19 virus   Hyponatremia   LOS: 2 days   How to contact the [facility] Attending or Consulting provider 7A - 7P or covering provider during after hours 7P -7A, for this patient?  1. Check the care team in [facility] and look for a) attending/consulting TRH provider listed and b) the [facility] team listed 2. Log into [email or web address] and use [place]'s universal password to access. If you do not have the password, please contact the hospital operator. 3. Locate the [facility] provider you are looking for under [facility] and page to a number that you can be directly reached. 4. If you still have difficulty reaching the provider, please page the [facility] (Director on Call) for the Hospitalists listed on amion for assistance.

## 2020-01-29 NOTE — TOC Initial Note (Signed)
Transition of Care [facility]) - Initial/Assessment Note    Patient Details  Name: RUBEN MAHLER MRN: [id number] Date of Birth: [birth year]  [facility] of [facility]) CM/SW Contact:    Kingsley Plan, RN Phone Number: 01/29/2020, 3:41 PM  Clinical Narrative:                  Patient from home , lives by self.   Patient uses cane for ambulating. He has a walker at home but was not using it prior to admission.   Discussed home oxygen. [employer] will bring portable oxygen tank to room prior to discharge and teach patient how to use it.   MD ordered PT evaluation. NCM will await recommendations. Potential discharge tomorrow. Patient voiced understanding.   Called Zach with [employer] and left message regarding home oxygen order.    Barriers to Discharge: Continued Medical Work up   Patient Goals and CMS Choice Patient states their goals for this hospitalization and ongoing recovery are:: to return to home CMS Medicare.gov Compare Post Acute Care list provided to:: Patient    Expected Discharge Plan and Services     Discharge Planning Services: CM Consult Post Acute Care Choice: Durable Medical Equipment Living arrangements for the past 2 months: Single Family Home                                      Prior Living Arrangements/Services Living arrangements for the past 2 months: Single Family Home Lives with:: Self Patient language and need for interpreter reviewed:: Yes Do you feel safe going back to the place where you live?: Yes          Current home services: DME Criminal Activity/Legal Involvement Pertinent to Current Situation/Hospitalization: No - Comment as needed  Activities of Daily Living      Permission Sought/Granted   Permission granted to share information with : No              Emotional Assessment   Attitude/Demeanor/Rapport: Engaged Affect (typically observed): Accepting Orientation: : Oriented to Situation,Oriented to   Time,Oriented to Place,Oriented to Self Alcohol / Substance Use: Not Applicable Psych Involvement: No (comment)  Admission diagnosis:  Acute respiratory failure with hypoxia (HCC) [J96.01] Multifocal pneumonia [J18.9] COVID-19 virus infection [U07.1] Acute respiratory disease due to COVID-19 virus [U07.1, J06.9] Acute respiratory failure due to COVID-19 (HCC) [U07.1, J96.00] Patient Active Problem List   Diagnosis Date Noted  . Type 2 diabetes mellitus with vascular disease (HCC) 01/27/2020  . Rheumatoid arthritis (HCC) 01/27/2020  . PAF (paroxysmal atrial fibrillation) (HCC) 01/27/2020  . CAD (coronary artery disease) 01/27/2020  . Acute hypoxemic respiratory failure due to COVID-19 (HCC) 01/27/2020  . Pneumonia due to COVID-19 virus 01/27/2020  . Hyponatremia 01/27/2020  . Acute respiratory disease due to COVID-19 virus 02/12/2020   PCP:  Lenox Ponds, MD Pharmacy:   [facility], [place] - [address] [place] [address] Phone: [phone] Fax: [phone]  [facility] - [place], [place] - [address] [place] [address] Phone: [phone] Fax: [phone]     Social Determinants of Health (SDOH) Interventions    Readmission Risk Interventions No flowsheet data found.

## 2020-01-30 DIAGNOSIS — E1159 Type 2 diabetes mellitus with other circulatory complications: Secondary | ICD-10-CM | POA: Diagnosis not present

## 2020-01-30 DIAGNOSIS — M069 Rheumatoid arthritis, unspecified: Secondary | ICD-10-CM | POA: Diagnosis not present

## 2020-01-30 DIAGNOSIS — U071 COVID-19: Secondary | ICD-10-CM | POA: Diagnosis not present

## 2020-01-30 DIAGNOSIS — J189 Pneumonia, unspecified organism: Secondary | ICD-10-CM | POA: Diagnosis not present

## 2020-01-30 LAB — COMPREHENSIVE METABOLIC PANEL
ALT: 31 U/L (ref 0–44)
AST: 29 U/L (ref 15–41)
Albumin: 2.6 g/dL — ABNORMAL LOW (ref 3.5–5.0)
Alkaline Phosphatase: 36 U/L — ABNORMAL LOW (ref 38–126)
Anion gap: 9 (ref 5–15)
BUN: 25 mg/dL — ABNORMAL HIGH (ref 8–23)
CO2: 19 mmol/L — ABNORMAL LOW (ref 22–32)
Calcium: 8.1 mg/dL — ABNORMAL LOW (ref 8.9–10.3)
Chloride: 106 mmol/L (ref 98–111)
Creatinine, Ser: 0.95 mg/dL (ref 0.61–1.24)
GFR, Estimated: >60 mL/min (ref >60)
Glucose, Bld: 109 mg/dL — ABNORMAL HIGH (ref 70–99)
Potassium: 3.4 mmol/L — ABNORMAL LOW (ref 3.5–5.1)
Sodium: 134 mmol/L — ABNORMAL LOW (ref 135–145)
Total Bilirubin: 0.7 mg/dL (ref 0.3–1.2)
Total Protein: 5.7 g/dL — ABNORMAL LOW (ref 6.5–8.1)

## 2020-01-30 LAB — CBC WITH DIFFERENTIAL/PLATELET
Abs Immature Granulocytes: 0.13 10*3/uL — ABNORMAL HIGH (ref 0.00–0.07)
Basophils Absolute: 0 10*3/uL (ref 0.0–0.1)
Basophils Relative: 0 %
Eosinophils Absolute: 0 10*3/uL (ref 0.0–0.5)
Eosinophils Relative: 0 %
HCT: 33.1 % — ABNORMAL LOW (ref 39.0–52.0)
Hemoglobin: 11.1 g/dL — ABNORMAL LOW (ref 13.0–17.0)
Immature Granulocytes: 1 %
Lymphocytes Relative: 5 %
Lymphs Abs: 0.6 10*3/uL — ABNORMAL LOW (ref 0.7–4.0)
MCH: 30.9 pg (ref 26.0–34.0)
MCHC: 33.5 g/dL (ref 30.0–36.0)
MCV: 92.2 fL (ref 80.0–100.0)
Monocytes Absolute: 0.7 10*3/uL (ref 0.1–1.0)
Monocytes Relative: 7 %
Neutro Abs: 9.5 10*3/uL — ABNORMAL HIGH (ref 1.7–7.7)
Neutrophils Relative %: 87 %
Platelets: 266 10*3/uL (ref 150–400)
RBC: 3.59 MIL/uL — ABNORMAL LOW (ref 4.22–5.81)
RDW: 14.7 % (ref 11.5–15.5)
WBC: 10.9 10*3/uL — ABNORMAL HIGH (ref 4.0–10.5)
nRBC: 0 % (ref 0.0–0.2)

## 2020-01-30 LAB — D-DIMER, QUANTITATIVE: D-Dimer, Quant: 1.41 ug/mL-FEU — ABNORMAL HIGH (ref 0.00–0.50)

## 2020-01-30 LAB — C-REACTIVE PROTEIN: CRP: 2.3 mg/dL — ABNORMAL HIGH (ref <1.0)

## 2020-01-30 LAB — GLUCOSE, CAPILLARY
Glucose-Capillary: 139 mg/dL — ABNORMAL HIGH (ref 70–99)
Glucose-Capillary: 90 mg/dL (ref 70–99)
Glucose-Capillary: 136 mg/dL — ABNORMAL HIGH (ref 70–99)
Glucose-Capillary: 136 mg/dL — ABNORMAL HIGH (ref 70–99)

## 2020-01-30 NOTE — Progress Notes (Signed)
PROGRESS NOTE  Javier Beard ZOX:[id number] DOB: 1940/08/07 DOA: 02/14/2020 PCP: Lenox Ponds, MD  Brief History   80 year old man PMH including rheumatoid arthritis on DMARD, diabetes mellitus type 2, permanent atrial fibrillation, chronic bilateral lower extremity edema presented with increasing shortness of breath, nausea, vomiting and diarrhea.  Symptoms for some time.  Low-grade temp in the emergency department.  Chest x-ray showed bilateral infiltrates, Covid test positive.  Admitted for acute hypoxic respiratory failure secondary to Covid pneumonia.  Initially oxygenation was stable at 2 to 3 L but now on 1/11.  Inflammatory markers are trending down and clinically he appears to be improving.  Desaturated with ambulation with physical therapy.  I do not think he is significantly worsening but given modest increase in oxygen will observe overnight.  If oxygenation remains stable 1/12 can likely go home with home health.  Oxygen has been arranged.  A & P  Acute hypoxic respiratory failure secondary to Covid pneumonia w/ N/V/D --Clinically improved, inflammatory markers improving, although oxygen now at 4 L from 3 yesterday.  Desats with ambulation.  Will monitor an additional day to ensure no decompensation.  If stable, home 1/12.  . CXR on admit: Multifocal opacities suspicious for multifocal pneumonia . Fever: None . Oxygen requirement: 4 L . Antibiotics: none . Remdesivir: 1/7 > 1/11 . Steroids: 1/8 > . Actemra/baricitinib: relatively contraindicated based on chronic immunosuppression as below; not indicated presently, regardless . Vitamin C and Zinc . Proning: Patient refuses   Inflammatory markers: . CRP: 17.6 > 11.8 > 6 > 2.3 . D-dimer: 2.01 > 1.8 > 1.49 > 1.41  Rheumatoid arthritis on Plaquenil, methotrexate and Simponi --Continue to hold methotrexate.  Continue Plaquenil.  Hyponatremia --Mild.  Expect spontaneous resolution  Secondary to nausea, vomiting and diarrhea  as an outpatient.  Diabetes mellitus type 2 --CBG remains stable.  Continue sliding scale insulin, glipizide. Stopped Lantus  Permanent atrial fibrillation on apixaban.  Not on rate control agent secondary to history of symptomatic bradycardia. --Stable.  Will continue apixaban.  Bilateral chronic lower extremity edema without history of CHF.  Followed by cardiology as an outpatient. --supportive care (see note 11/27/19)   Disposition Plan:  Discussion: Plan as above  Status is: Inpatient  Remains inpatient appropriate because:IV treatments appropriate due to intensity of illness or inability to take PO and Inpatient level of care appropriate due to severity of illness   Dispo: The patient is from: Home              Anticipated d/c is to: Home              Anticipated d/c date is: 1 day              Patient currently is not medically stable to d/c.  DVT prophylaxis:  apixaban (ELIQUIS) tablet 5 mg    Code Status: Full Code Family Communication: none  Brendia Sacks, MD  [facility] Direct contact: see [email or web address] (further directions at bottom of note if needed) 7PM-7AM contact night coverage as at bottom of note 01/30/2020, 7:36 PM  LOS: 3 days    Interval History/Subjective  CC: f/u SOB  Feels fine, breathing fine, denies complaints.  Wants to go home.  Reports no difficulty getting to the bathroom with assistance.  Uses a cane and walker at home.  Objective   Vitals:  Vitals:   01/30/20 0441 01/30/20 1500  BP: 133/72   Pulse: 82   Resp: 17   Temp: 98 F (  36.7 C)   SpO2: 92% (!) 88%    Exam:  Constitutional:   . Appears calm and comfortable ENMT:  . grossly normal hearing  Respiratory:  . CTA bilaterally, no w/r/r.  . Respiratory effort normal.  . On 4L w/ SpO2 low 90s.  Cardiovascular:  . RRR, no m/r/g . No LE extremity edema   Psychiatric:  . Mental status o Mood, affect appropriate  I have personally reviewed the following:   Today's  Data  . CBG stable . CMP noted . CRP and ddimer down . CBC stable  Scheduled Meds: . apixaban  5 mg Oral BID  . vitamin C  500 mg Oral Daily  . calcium carbonate  1,250 mg Oral Q breakfast  . calcium carbonate  0.5 tablet Oral Q supper  . doxazosin  1 mg Oral QHS  . folic acid  1 mg Oral Daily  . furosemide  60 mg Oral Daily  . glipiZIDE  5 mg Oral QAC breakfast  . hydroxychloroquine  200 mg Oral BID  . insulin aspart  0-9 Units Subcutaneous TID WC  . insulin aspart  3 Units Subcutaneous TID WC  . losartan  50 mg Oral BID  . pantoprazole  40 mg Oral Daily  . potassium chloride  10 mEq Oral BID  . pravastatin  80 mg Oral q1800  . predniSONE  50 mg Oral Daily  . vitamin B-12  1,000 mcg Oral Daily  . zinc sulfate  220 mg Oral Daily   Continuous Infusions:   Principal Problem:   Acute hypoxemic respiratory failure due to COVID-19 [facility]) Active Problems:   Type 2 diabetes mellitus with vascular disease (HCC)   Rheumatoid arthritis (HCC)   PAF (paroxysmal atrial fibrillation) (HCC)   CAD (coronary artery disease)   Pneumonia due to COVID-19 virus   Hyponatremia   LOS: 3 days   How to contact the [facility] Attending or Consulting provider 7A - 7P or covering provider during after hours 7P -7A, for this patient?  1. Check the care team in [facility] and look for a) attending/consulting TRH provider listed and b) the [facility] team listed 2. Log into [email or web address] and use [place]'s universal password to access. If you do not have the password, please contact the hospital operator. 3. Locate the [facility] provider you are looking for under [facility] and page to a number that you can be directly reached. 4. If you still have difficulty reaching the provider, please page the [facility] (Director on Call) for the Hospitalists listed on amion for assistance.

## 2020-01-30 NOTE — Evaluation (Signed)
Physical Therapy Evaluation Patient Details Name: Javier Beard MRN: [id number] DOB: 1940-09-29 Today's Date: 01/30/2020   History of Present Illness  80 year old man PMH including rheumatoid arthritis on DMARD, diabetes mellitus type 2, permanent atrial fibrillation, chronic bilateral lower extremity edema presented with increasing shortness of breath, nausea, vomiting and diarrhea.  Symptoms for some time.  Low-grade temp in the emergency department.  Chest x-ray showed bilateral infiltrates, Covid test positive.  Admitted for acute hypoxic respiratory failure secondary to Covid pneumonia.  Clinical Impression  Pt fatigued and only agreeable to get back in bed. Pt with limited history given due to fatigue. Pt states he occasionally used a RW or cane prior to admission but lives alone. Pt states daughter can stay a few days after d/c. During mobility Pt desatted to 82% on 4L, increased to 5L with increasd time and cueing for breathing pt improved to 89%, O2 returned to 4L RN and MD notified. Pt demonstrating deficits in gait, balance, activity tolerance and safety and would benefit from skilled PT to address deficits to maximize independence with functional mobility prior to discharge.     Follow Up Recommendations Home health PT;Supervision/Assistance - 24 hour    Equipment Recommendations   follow up regarding bathroom equipment, pt with limited answers due to fatigue    Recommendations for Other Services       Precautions / Restrictions Precautions Precautions: Fall      Mobility  Bed Mobility                    Transfers Overall transfer level: Needs assistance Equipment used: Rolling walker (2 wheeled) Transfers: Sit to/from Stand Sit to Stand: Supervision         General transfer comment: for safety  Ambulation/Gait Ambulation/Gait assistance: Supervision Gait Distance (Feet): 6 Feet Assistive device: Rolling walker (2 wheeled)       General Gait Details:  pt with shuffling gait, S for line managemetn and RW management. Pt desatted to 82% on 4L, increased to 5L with increasd time and cueing for breathing pt improved to 89%, O2 returned to 4L RN and MD notified  Stairs            Wheelchair Mobility    Modified Rankin (Stroke Patients Only)       Balance Overall balance assessment: Needs assistance Sitting-balance support: Feet supported Sitting balance-Leahy Scale: Good     Standing balance support: Bilateral upper extremity supported Standing balance-Leahy Scale: Fair                               Pertinent Vitals/Pain Pain Assessment: No/denies pain    Home Living Family/patient expects to be discharged to:: Private residence Living Arrangements: Alone Available Help at Discharge: Family;Available 24 hours/day (for a couple of days) Type of Home: House Home Access: Ramped entrance     Home Layout: One level Home Equipment: Walker - 2 wheels;Cane - single point      Prior Function Level of Independence: Independent with assistive device(s)         Comments: occasionally used RW     Hand Dominance        Extremity/Trunk Assessment        Lower Extremity Assessment Lower Extremity Assessment: Overall WFL for tasks assessed       Communication   Communication: No difficulties  Cognition Arousal/Alertness: Awake/alert Behavior During Therapy: WFL for tasks assessed/performed Overall Cognitive Status: Within  Functional Limits for tasks assessed                                        General Comments General comments (skin integrity, edema, etc.): Pt desatted to 82% on 4L, increased to 5L with increasd time and cueing for breathing pt improved to 89%, O2 returned to 4L RN and MD notified    Exercises     Assessment/Plan    PT Assessment Patient needs continued PT services  PT Problem List Decreased activity tolerance;Decreased balance;Decreased mobility        PT Treatment Interventions Therapeutic exercise;Gait training;Balance training;Therapeutic activities;Patient/family education    PT Goals (Current goals can be found in the Care Plan section)  Acute Rehab PT Goals Patient Stated Goal: I want to rest PT Goal Formulation: With patient Time For Goal Achievement: 02/13/20 Potential to Achieve Goals: Fair    Frequency Min 3X/week   Barriers to discharge        Co-evaluation               AM-PAC PT "6 Clicks" Mobility  Outcome Measure Help needed turning from your back to your side while in a flat bed without using bedrails?: None Help needed moving from lying on your back to sitting on the side of a flat bed without using bedrails?: None Help needed moving to and from a bed to a chair (including a wheelchair)?: A Little Help needed standing up from a chair using your arms (e.g., wheelchair or bedside chair)?: A Little Help needed to walk in hospital room?: A Little Help needed climbing 3-5 steps with a railing? : A Lot 6 Click Score: 19    End of Session Equipment Utilized During Treatment: Gait belt;Oxygen Activity Tolerance: Patient limited by fatigue Patient left: in bed;with nursing/sitter in room;with call bell/phone within reach Nurse Communication: Mobility status PT Visit Diagnosis: Unsteadiness on feet (R26.81);Other abnormalities of gait and mobility (R26.89)    Time: 1430-1441 PT Time Calculation (min) (ACUTE ONLY): 11 min   Charges:   PT Evaluation $PT Eval Low Complexity: 1 Low          Ginette Otto, DPT Acute Rehabilitation Services [phone]  Lucretia Field 01/30/2020, 3:05 PM

## 2020-01-31 ENCOUNTER — Inpatient Hospital Stay (HOSPITAL_COMMUNITY): Payer: Medicare Other

## 2020-01-31 DIAGNOSIS — U071 COVID-19: Secondary | ICD-10-CM | POA: Diagnosis not present

## 2020-01-31 DIAGNOSIS — J9601 Acute respiratory failure with hypoxia: Secondary | ICD-10-CM | POA: Diagnosis not present

## 2020-01-31 DIAGNOSIS — R0602 Shortness of breath: Secondary | ICD-10-CM | POA: Diagnosis not present

## 2020-01-31 DIAGNOSIS — J189 Pneumonia, unspecified organism: Secondary | ICD-10-CM

## 2020-01-31 LAB — COMPREHENSIVE METABOLIC PANEL
ALT: 25 U/L (ref 0–44)
AST: 23 U/L (ref 15–41)
Albumin: 2.4 g/dL — ABNORMAL LOW (ref 3.5–5.0)
Alkaline Phosphatase: 33 U/L — ABNORMAL LOW (ref 38–126)
Anion gap: 9 (ref 5–15)
BUN: 22 mg/dL (ref 8–23)
CO2: 20 mmol/L — ABNORMAL LOW (ref 22–32)
Calcium: 7.8 mg/dL — ABNORMAL LOW (ref 8.9–10.3)
Chloride: 106 mmol/L (ref 98–111)
Creatinine, Ser: 0.91 mg/dL (ref 0.61–1.24)
GFR, Estimated: >60 mL/min (ref >60)
Glucose, Bld: 101 mg/dL — ABNORMAL HIGH (ref 70–99)
Potassium: 3.8 mmol/L (ref 3.5–5.1)
Sodium: 135 mmol/L (ref 135–145)
Total Bilirubin: 0.7 mg/dL (ref 0.3–1.2)
Total Protein: 5.3 g/dL — ABNORMAL LOW (ref 6.5–8.1)

## 2020-01-31 LAB — CBC WITH DIFFERENTIAL/PLATELET
Abs Immature Granulocytes: 0.08 10*3/uL — ABNORMAL HIGH (ref 0.00–0.07)
Basophils Absolute: 0 10*3/uL (ref 0.0–0.1)
Basophils Relative: 0 %
Eosinophils Absolute: 0 10*3/uL (ref 0.0–0.5)
Eosinophils Relative: 0 %
HCT: 30.1 % — ABNORMAL LOW (ref 39.0–52.0)
Hemoglobin: 10.6 g/dL — ABNORMAL LOW (ref 13.0–17.0)
Immature Granulocytes: 1 %
Lymphocytes Relative: 6 %
Lymphs Abs: 0.5 10*3/uL — ABNORMAL LOW (ref 0.7–4.0)
MCH: 32.6 pg (ref 26.0–34.0)
MCHC: 35.2 g/dL (ref 30.0–36.0)
MCV: 92.6 fL (ref 80.0–100.0)
Monocytes Absolute: 0.6 10*3/uL (ref 0.1–1.0)
Monocytes Relative: 7 %
Neutro Abs: 7.2 10*3/uL (ref 1.7–7.7)
Neutrophils Relative %: 86 %
Platelets: 265 10*3/uL (ref 150–400)
RBC: 3.25 MIL/uL — ABNORMAL LOW (ref 4.22–5.81)
RDW: 14.9 % (ref 11.5–15.5)
WBC: 8.4 10*3/uL (ref 4.0–10.5)
nRBC: 0 % (ref 0.0–0.2)

## 2020-01-31 LAB — CULTURE, BLOOD (ROUTINE X 2)
Culture: NO GROWTH
Culture: NO GROWTH
Special Requests: ADEQUATE
Special Requests: ADEQUATE

## 2020-01-31 LAB — ECHOCARDIOGRAM COMPLETE
Height: 66 in
P 1/2 time: 586 msec
S' Lateral: 3 cm
Weight: 2880 oz

## 2020-01-31 LAB — GLUCOSE, CAPILLARY
Glucose-Capillary: 57 mg/dL — ABNORMAL LOW (ref 70–99)
Glucose-Capillary: 78 mg/dL (ref 70–99)
Glucose-Capillary: 153 mg/dL — ABNORMAL HIGH (ref 70–99)
Glucose-Capillary: 175 mg/dL — ABNORMAL HIGH (ref 70–99)
Glucose-Capillary: 264 mg/dL — ABNORMAL HIGH (ref 70–99)

## 2020-01-31 LAB — BLOOD GAS, ARTERIAL
Acid-base deficit: 3.5 mmol/L — ABNORMAL HIGH (ref 0.0–2.0)
Bicarbonate: 19.4 mmol/L — ABNORMAL LOW (ref 20.0–28.0)
Drawn by: 441351
FIO2: 100
O2 Saturation: 98.9 %
Patient temperature: 37
pCO2 arterial: 26.2 mmHg — ABNORMAL LOW (ref 32.0–48.0)
pH, Arterial: 7.483 — ABNORMAL HIGH (ref 7.350–7.450)
pO2, Arterial: 188 mmHg — ABNORMAL HIGH (ref 83.0–108.0)

## 2020-01-31 LAB — D-DIMER, QUANTITATIVE: D-Dimer, Quant: 1.34 ug/mL-FEU — ABNORMAL HIGH (ref 0.00–0.50)

## 2020-01-31 LAB — C-REACTIVE PROTEIN: CRP: 3.2 mg/dL — ABNORMAL HIGH (ref <1.0)

## 2020-01-31 LAB — BRAIN NATRIURETIC PEPTIDE: B Natriuretic Peptide: 117.3 pg/mL — ABNORMAL HIGH (ref 0.0–100.0)

## 2020-01-31 LAB — PROCALCITONIN: Procalcitonin: <0.1 ng/mL

## 2020-01-31 LAB — TROPONIN I (HIGH SENSITIVITY): Troponin I (High Sensitivity): 10 ng/L (ref <18)

## 2020-01-31 IMAGING — DX DG CHEST 1V PORT
1 series · 1 of 1 positions shown · non-contrast
Comparison: Radiograph [DATE], CT [DATE]

CLINICAL DATA: Rapid response, acute respiratory failure due to
COVID 19

EXAM:
PORTABLE CHEST 1 VIEW

[chest ap]
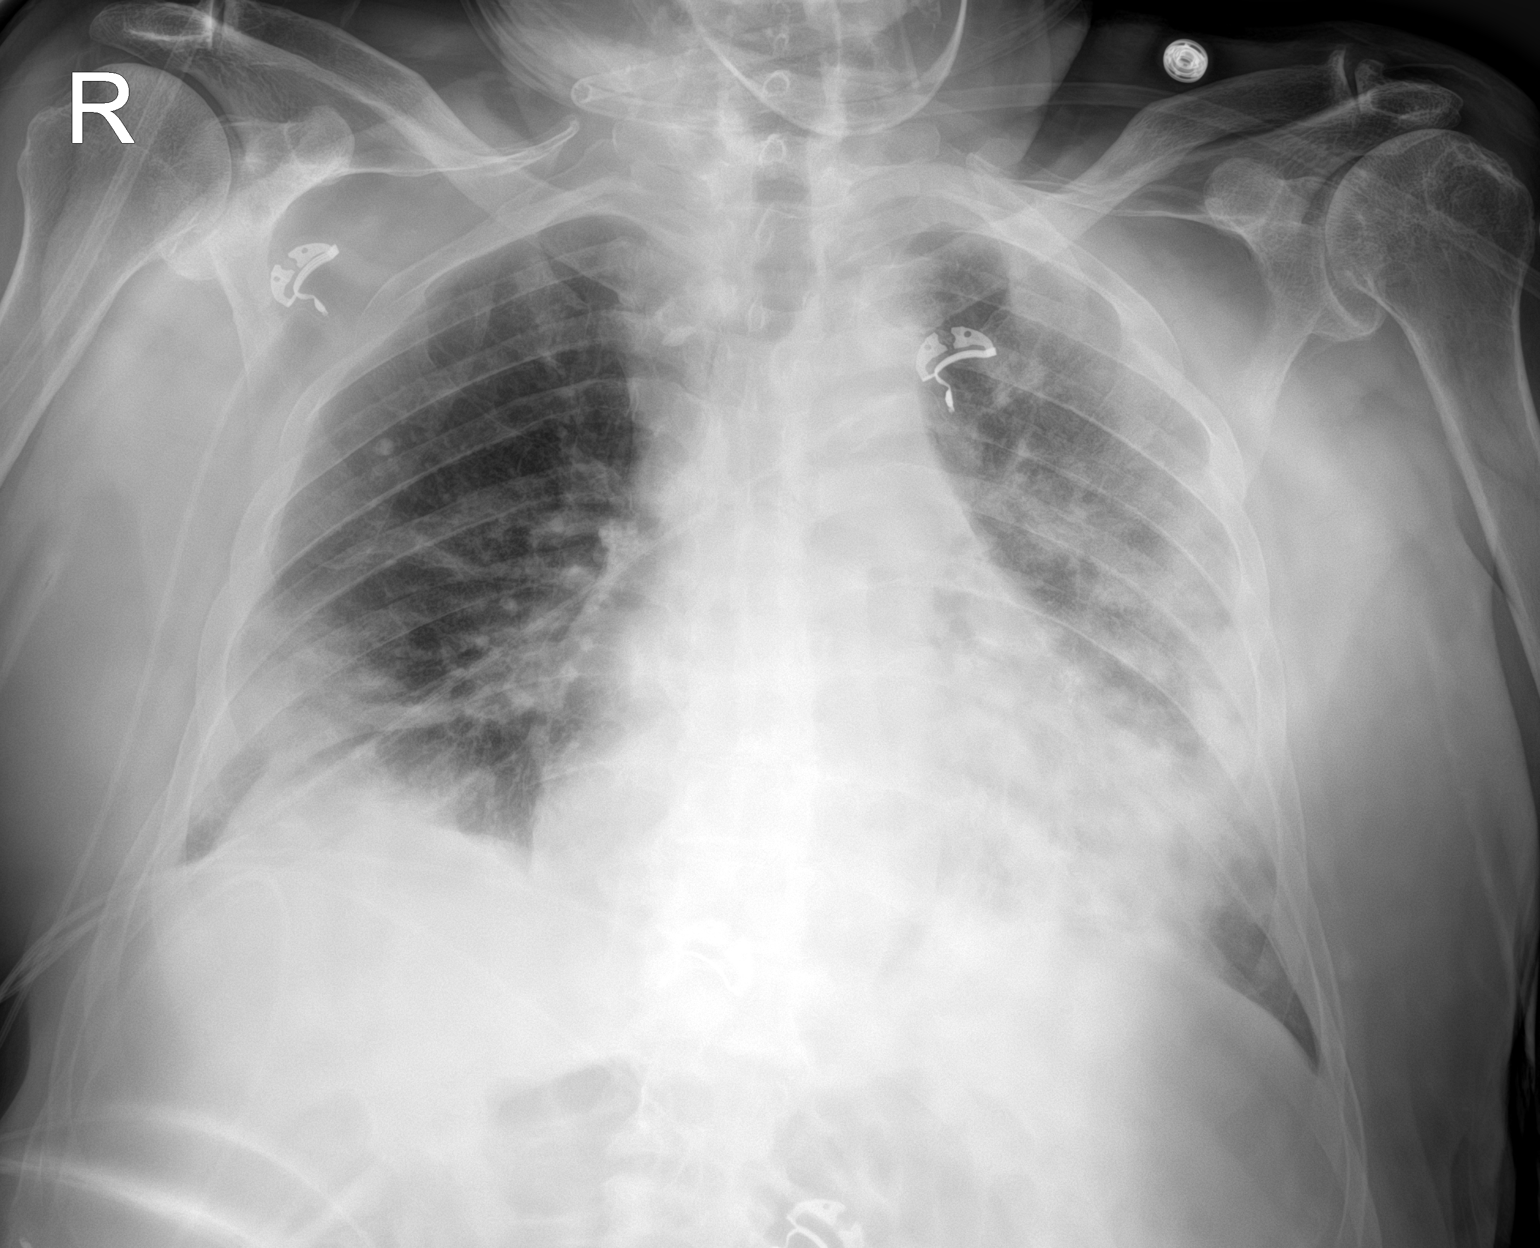

[1 of 1 positions shown; findings below may reference images not displayed]

FINDINGS: Increasing heterogeneous opacities throughout both lungs, most
focally coalescent in the left lung periphery and bilateral bases.
No pneumothorax. No visible pleural effusion. Stable cardiomegaly.
Hiatal hernia again noted. Remaining cardiomediastinal contours are
unremarkable. No acute osseous or soft tissue abnormality.
Degenerative changes are present in the imaged spine and shoulders.
Telemetry leads overlie the chest.
IMPRESSION: Increasing heterogeneous opacities throughout both lungs, concerning
for worsening infection in the setting of [07]

Stable cardiomegaly.

Stable hiatal hernia.

## 2020-01-31 MED ORDER — BUTALBITAL-APAP-CAFFEINE 50-325-40 MG PO TABS
1.0000 | ORAL_TABLET | Freq: Four times a day (QID) | ORAL | Status: DC | PRN
  Administered 2020-01-31 – 2020-02-02 (×2): 1 via ORAL
  Filled 2020-01-31 (×3): qty 1

## 2020-01-31 MED ORDER — FUROSEMIDE 10 MG/ML IJ SOLN
INTRAMUSCULAR | Status: AC
  Administered 2020-01-31: 10 mg
  Filled 2020-01-31: qty 4

## 2020-01-31 MED ORDER — METHOCARBAMOL 500 MG PO TABS
500.0000 mg | ORAL_TABLET | Freq: Three times a day (TID) | ORAL | Status: DC | PRN
  Administered 2020-01-31 – 2020-02-02 (×2): 500 mg via ORAL
  Filled 2020-01-31 (×2): qty 1

## 2020-01-31 NOTE — Progress Notes (Signed)
While rounding, 0205, pt was desating 84-86, RR was 20 and complains of shortness of breath. Notified Rapid and  RT. RT hooked pt to NRB at 15L which increased pt's O2 sat to 95 at this time. MD also notified. Will continue to monitor pt.

## 2020-01-31 NOTE — Progress Notes (Signed)
Pt was noted to have mild confusion at this time, can't remember where he is at. Pt also complains of shortness of breath. Breathing treatment administered.  Vitals within normal limits. RR was 18, pt's oxygenation is 96 -98 on 3L. Will monitor pt.

## 2020-01-31 NOTE — Progress Notes (Signed)
Overnight event  Patient with history of rheumatoid arthritis on DMARDs, diabetes, permanent A. fib on Eliquis, chronic bilateral lower extremity edema admitted for acute hypoxic respiratory failure secondary to COVID-pneumonia.  He finished a 5 dose course of remdesivir on 1/11.  He received IV Solu-Medrol 0.5 mg/kg every 12 hours for 3 days and currently on prednisone 50 mg daily.  Actemra/baricitinib relatively contraindicated as he is on chronic immunosuppression.  His inflammatory markers including CRP and D-dimer have been trending down.  Paged by remote floor coverage to assess the patient for worsening hypoxia.  Notified by RN that patient was previously satting well on 3 L supplemental oxygen but suddenly desatted to the 80s and became tachypneic.  He was placed on 15 L high flow nasal cannula plus nonrebreather and sats improved to the 90s.  Patient has been refusing to prone.  Patient was seen and examined at bedside.  Complaining of shortness of breath.  Denies chest pain.  Increased work of breathing with accessory muscle use.  Rales appreciated at the bases upon auscultation of the lungs; no wheezing.  Satting in mid 90s on 15 L high flow nasal cannula plus nonrebreather.  In A. fib with rate in the 90s.  Blood pressure stable-130/63.  -Chest x-ray done and showing increasing heterogenous opacities throughout both lungs concerning for worsening infection in setting of COVID-19. -Patient was given IV Lasix 40 mg -ABG showing pH 7.48, PCO2 26, PO2 188 -EKG showing rate controlled A. fib, no STEMI.  Check high-sensitivity troponin level. -Check BNP level, echocardiogram ordered -Bacterial pneumonia less likely as he does not have leukocytosis on labs.  Check procalcitonin level, if elevated, start antibiotics.  -PE less likely as patient is on chronic anticoagulation with Eliquis and D-dimer has been trending down -PCCM team has seen the patient and recommending continuing high flow nasal  cannula for now.  Patient will be transferred to 5 W.

## 2020-01-31 NOTE — Progress Notes (Addendum)
NAME:  Javier Beard, MRN:  [id number], DOB:  10-Oct-1940, LOS: 4 ADMISSION DATE:  02/16/2020, CONSULTATION DATE:  01/31/20 REFERRING MD:  Dr. Loney Loh, CHIEF COMPLAINT:  Worsening dyspnea   Brief History:  80 y.o. 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  PCCM consulted for possible bipap  History of Present Illness:  Javier Beard is a 80 y.o. 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   Early morning hours of 1/12 Pt became acutely more dyspneic and had increasing oxygen requirements from 3L to 15L and non-rebreather.  He has not had increasing leukocytosis and no known HF.   ABG 7.4/26/188/19.   PCCM consulted for possible Bipap and ICU transfer  Past Medical History:   has a past medical history of Arthritis, Atrial fibrillation (HCC), Coronary artery disease, DM type 2 (diabetes mellitus, type 2) (HCC), Hypertension, and Rheumatoid arteritis (HCC).   Significant Hospital Events:  1/8 Admitted 1/12 PCCM consult, transfer to progressive   Consults:  PCCM  Procedures:    Significant Diagnostic Tests:  1/12 CXR>>Increasing heterogeneous opacities throughout both lungs, concerning for worsening infection in the setting of COVID-19   Micro Data:    Antimicrobials:   Remdesevir 1/7-1/11  Interim History / Subjective:  Pt moaning and tachypneic, sats >92%, was given 40mg  Lasix  Objective   Blood pressure 130/63, pulse 92, temperature 98 F (36.7 C), resp. rate 20, height 5\' 6"  (1.676 m), weight 81.6 kg, SpO2 95 %.    FiO2 (%):  [100 %] 100 %   Intake/Output Summary  (Last 24 hours) at 01/31/2020 0457 Last data filed at 01/30/2020 2247 Gross per 24 hour  Intake 600 ml  Output 1000 ml  Net -400 ml   Filed Weights   02/07/2020 1316  Weight: 81.6 kg    General:  Elderly M, resting, tachypneic with mild accessory muscle use, no severe distress HEENT: MM pink/moist Neuro: arousable, answers questions appropriately and follows commands CV: s1s2 rrr, no m/r/g PULM:  Course breath sounds bilaterally, sats 90-92% on 15L GI: soft, bsx4 active  Extremities: warm/dry, 1+ edema  Skin: no rashes or lesions   Resolved Hospital Problem list     Assessment & Plan:    Acute hypoxic respiratory failure secondary to Covid-19 PNA CXR with worsening heterogeneous opacities consistent with clinical worsening.  POCUS cardiac US done by Dr. Merrily Pew with hyperdynamic RV without enlargement, EF does not appear reduced.  Pt is on Eliquis making PE unlikely.  No femoral DVT appreciated with POCUS either.  Clinical picture also not currently suggestive of bacterial infection P: -Transfer to progressive for trial of HFNC -monitor I&O after Lasix, obtain formal Echo -agree with primary team's current management, PCCM will continue to follow with you as pt is at risk of further respiratory decompensation  Best practice (evaluated daily)  Diet: per primary Pain/Anxiety/Delirium protocol (if indicated): n/a VAP protocol (if indicated): n/a DVT prophylaxis: eliquis GI prophylaxis: n/a Glucose control: per primary Mobility: bed rest Disposition:progressive  Goals of Care:  Last date of multidisciplinary goals of care discussion: Family and staff present:  Summary of discussion:  Follow up goals of  care discussion due:  Code Status: full code  Labs   CBC: Recent Labs  Lab February 15, 2020 1356 01/28/20 0115 01/29/20 0108 01/30/20 1052 01/31/20 0107  WBC 3.7* 5.4 8.0 10.9* 8.4  NEUTROABS  --  4.7 7.2 9.5* 7.2  HGB 11.7* 10.4* 10.6* 11.1* 10.6*  HCT 34.4* 31.7* 31.4*  33.1* 30.1*  MCV 93.2 93.8 92.6 92.2 92.6  PLT 195 195 229 266 265    Basic Metabolic Panel: Recent Labs  Lab 01/27/20 0610 01/28/20 0115 01/29/20 0108 01/30/20 1052 01/31/20 0107  NA 134* 134* 133* 134* 135  K 3.8 3.9 3.9 3.4* 3.8  CL 105 107 106 106 106  CO2 17* 18* 17* 19* 20*  GLUCOSE 192* 252* 219* 109* 101*  BUN 14 24* 31* 25* 22  CREATININE 0.77 0.97 1.06 0.95 0.91  CALCIUM 7.7* 7.8* 7.9* 8.1* 7.8*   GFR: Estimated Creatinine Clearance: 68.2 mL/min (by C-G formula based on SCr of 0.91 mg/dL). Recent Labs  Lab February 15, 2020 1611 02-15-2020 1814 01/27/20 0412 01/28/20 0115 01/29/20 0108 01/30/20 1052 01/31/20 0107  PROCALCITON 0.17  --  0.13  --   --   --   --   WBC  --   --   --  5.4 8.0 10.9* 8.4  LATICACIDVEN 1.2 1.5  --   --   --   --   --     Liver Function Tests: Recent Labs  Lab 2020/02/15 1611 01/28/20 0115 01/29/20 0108 01/30/20 1052 01/31/20 0107  AST 30 27 23 29 23   ALT 18 21 22 31 25   ALKPHOS 31* 27* 32* 36* 33*  BILITOT 0.9 0.6 0.5 0.7 0.7  PROT 5.7* 5.6* 5.5* 5.7* 5.3*  ALBUMIN 2.8* 2.5* 2.5* 2.6* 2.4*   Recent Labs  Lab February 15, 2020 1356  LIPASE 46   No results for input(s): AMMONIA in the last 168 hours.  ABG    Component Value Date/Time   PHART 7.483 (H) 01/31/2020 0350   PCO2ART 26.2 (L) 01/31/2020 0350   PO2ART 188 (H) 01/31/2020 0350   HCO3 19.4 (L) 01/31/2020 0350   ACIDBASEDEF 3.5 (H) 01/31/2020 0350   O2SAT 98.9 01/31/2020 0350     Coagulation Profile: No results for input(s): INR, PROTIME in the last 168 hours.  Cardiac Enzymes: No results for input(s): CKTOTAL, CKMB, CKMBINDEX, TROPONINI in the last 168 hours.  HbA1C: No results found for: HGBA1C  CBG: Recent Labs  Lab 01/29/20 2124 01/30/20 0821 01/30/20 1323 01/30/20 1803 01/30/20 2243  GLUCAP 110* 139* 90 136* 136*    Review of Systems:   Negative except as noted in HPI  Past Medical History:  He,  has a past medical history of Arthritis, Atrial  fibrillation (HCC), Coronary artery disease, DM type 2 (diabetes mellitus, type 2) (HCC), Hypertension, and Rheumatoid arteritis (HCC).   Surgical History:   Past Surgical History:  Procedure Laterality Date  . CARDIAC CATHETERIZATION    . CORONARY ANGIOPLASTY    . HIP SURGERY    . HIP SURGERY       Social History:   reports that he has quit smoking. He has never used smokeless tobacco. He reports that he does not drink alcohol and does not use drugs.   Family History:  His family history includes Diabetes Mellitus II in his maternal grandmother.   Allergies No Known Allergies   Home Medications  Prior to Admission medications   Medication Sig Start Date End Date Taking? Authorizing Provider  acetaminophen (TYLENOL) 650 MG CR tablet  Take 650 mg by mouth daily. 08/27/15  Yes [provider]  calcium carbonate (OSCAL) 1500 (600 Ca) MG TABS tablet Take 600-1,200 mg by mouth See admin instructions. Take 1200mg  by mouth in the morning and 600mg  at bedtime. 09/30/15  Yes [provider]  denosumab (PROLIA) 60 MG/ML SOSY injection Inject 60 mg into the skin every 6 (six) months.   Yes [provider]  diclofenac Sodium (VOLTAREN) 1 % GEL Apply 1 application topically daily as needed for pain. 02/25/15  Yes [provider]  doxazosin (CARDURA) 1 MG tablet Take 1 mg by mouth at bedtime. 01/15/20  Yes [provider]  ELIQUIS 5 MG TABS tablet Take 5 mg by mouth 2 (two) times daily. 12/18/19  Yes [provider]  ergocalciferol (VITAMIN D2) 1.25 MG (50000 UT) capsule Take 1 capsule by mouth every 14 (fourteen) days. 09/27/19  Yes [provider]  folic acid (FOLVITE) 1 MG tablet Take 1 tablet by mouth daily. 11/15/19  Yes [provider]  furosemide (LASIX) 20 MG tablet Take 60 mg by mouth daily. 01/01/20  Yes [provider]  glipiZIDE (GLUCOTROL) 5 MG tablet Take 5 mg by mouth daily. 01/01/20  Yes [provider]  golimumab (SIMPONI ARIA) 50 MG/4ML SOLN injection Inject 12.5 mg into the vein every 8 (eight) weeks.   Yes [provider]  hydroxychloroquine (PLAQUENIL) 200 MG tablet Take 200 mg by mouth 2 (two) times daily. 01/01/20  Yes [provider]  losartan (COZAAR) 50 MG tablet Take 50 mg by mouth 2 (two) times daily. 11/13/19  Yes [provider]  methotrexate 2.5 MG tablet Take 25 mg by mouth once a week. 01/01/20  Yes [provider]  pantoprazole (PROTONIX) 40 MG tablet Take 40 mg by mouth daily. 01/01/20  Yes [provider]  potassium chloride (KLOR-CON) 10 MEQ tablet Take 10 mEq by mouth 2 (two) times daily.   Yes [provider]  pravastatin (PRAVACHOL) 80 MG tablet Take 80 mg by mouth daily.   Yes [provider]  predniSONE (DELTASONE) 5 MG tablet Take 5 mg by mouth daily. Take with food or milk.   Yes [provider]  vitamin B-12 (CYANOCOBALAMIN) 1000 MCG tablet Take 1,000 mcg by mouth daily.   Yes [provider]     Critical care time: 35 minutes     CRITICAL CARE Performed by: 01/03/20 Mikyla Schachter   Total critical care time: 35 minutes  Critical care time was exclusive of separately billable procedures and treating other patients.  Critical care was necessary to treat or prevent imminent or life-threatening deterioration.  Critical care was time spent personally by me on the following activities: development of treatment plan with patient and/or surrogate as well as nursing, discussions with consultants, evaluation of patient's response to treatment, examination of patient, obtaining history from patient or surrogate, ordering and performing treatments and interventions, ordering and review of laboratory studies, ordering and review of radiographic studies, pulse oximetry and re-evaluation of patient's condition.  01/03/20 Matteus Mcnelly, PA-C [facility]  See Amion for pager details

## 2020-01-31 NOTE — Progress Notes (Signed)
  Echocardiogram 2D Echocardiogram has been performed.  Javier Beard 01/31/2020, 2:25 PM

## 2020-01-31 NOTE — Significant Event (Signed)
Rapid Response Event Note   Reason for Call :  Called originally at 0158 d/t pt SpO2-84-86 on 3L [place]. Per charge RN, RT at bedside to assess.  Per RT, pt refusing to prone however, is agreeable to lay on his side. Pt was placed on NRB mask over 15 L HFNC because he was mouth breathing. After these interventions made by RT, pt was resting with SpO2-95%.  At 0250 RRT called d/t SOB.   Initial Focused Assessment:  Pt lying in bed on his R side. He is alert and oriented.  Pt denies chest pain but does c/o SOB. He said his SOB has gotten progressively worse t/o the shift. Pt is tachypneic and labored. Lungs diminished t/o. Skin warm and dry.  T-98, HR-92, BP-130/63, RR-28, SpO2-95% on 15L HFNC and NRB.  0400-Pt getting increasingly confused, got OOB, pulled off oxygen, and voided in floor.     Interventions:  PCXR-Increasing heterogeneous opacities throughout both lungs, concerning for worsening infection in the setting of COVID-19. Stable cardiomegaly. Stable hiatal hernia. ABG-7.48/26.2/188/19.4 EKG-Afib 40mg  lasix IV Trop, BNP, Pct PCCM consulted: Tx to 5W PCU for [facility] Plan of Care:  Tx to PCU for [facility]. Ok to wait until pt on 5W to place on [facility].  Please call RRT if further assistance needed.    Event Summary:   MD Notified: [facility], NP notified. [facility] MD([place]) to beside to assess pt.  Call Time:0158 Arrival Time:0255 End Anna Genre  [id number], RN

## 2020-01-31 NOTE — Progress Notes (Signed)
NAME:  Javier Beard, MRN:  [id number], DOB:  18-Apr-1940, LOS: 4 ADMISSION DATE:  02/11/2020, CONSULTATION DATE:  01/31/20 REFERRING MD:  Dr. Loney Loh, CHIEF COMPLAINT:  Worsening dyspnea   Brief History:  80 y.o. 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  PCCM consulted for possible bipap  History of Present Illness:  [name] is a 80 y.o. 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   Early morning hours of 1/12 Pt became acutely more dyspneic and had increasing oxygen requirements from 3L to 15L and non-rebreather.  He has not had increasing leukocytosis and no known HF.   ABG 7.4/26/188/19.   PCCM consulted for possible Bipap and ICU transfer  Past Medical History:   has a past medical history of Arthritis, Atrial fibrillation (HCC), Coronary artery disease, DM type 2 (diabetes mellitus, type 2) (HCC), Hypertension, and Rheumatoid arteritis (HCC).   Significant Hospital Events:  1/8 Admitted 1/12 PCCM consult, transfer to progressive   Consults:  PCCM  Procedures:    Significant Diagnostic Tests:  1/12 CXR>>Increasing heterogeneous opacities throughout both lungs, concerning for worsening infection in the setting of COVID-19  Micro Data:    Antimicrobials:   Remdesevir 1/7-1/11  Interim History / Subjective:  Comfortable in bed this afternoon  Objective   Blood pressure 128/67, pulse 92, temperature 98 F (36.7 C), temperature source Oral, resp. rate 20, height 5\' 6"  (1.676 m), weight 81.6 kg, SpO2 99 %.    FiO2 (%):  [100 %] 100 %   Intake/Output Summary  (Last 24 hours) at 01/31/2020 1432 Last data filed at 01/31/2020 1100 Gross per 24 hour  Intake 720 ml  Output 1050 ml  Net -330 ml   Filed Weights   02/05/2020 1316  Weight: 81.6 kg    General: Elderly, does not appear to be in distress HEENT: Moist oral mucosa Neuro: Moving all extremities with no focality CV: s1s2 rrr, no m/r/g PULM: Coarse breath sounds GI: soft, bsx4 active  Extremities: Lower extremity edema Skin: no rashes or lesions   Resolved Hospital Problem list     Assessment & Plan:   Acute hypoxemic respiratory failure secondary to COVID-19 pneumonia  POCUS cardiac 03/25/20 done by Dr. [place] 01/31/20 with hyperdynamic RV without enlargement, EF does not appear reduced.  Pt is on Eliquis making PE unlikely.  No femoral DVT appreciated with POCUS.  Continue oxygen segmentation Follow-up on echocardiogram  Risk of respiratory decompensation does exist however stable at present  Best practice (evaluated daily)  Diet: per primary Pain/Anxiety/Delirium protocol (if indicated): n/a VAP protocol (if indicated): n/a DVT prophylaxis: eliquis GI prophylaxis: n/a Glucose control: per primary Mobility: bed rest Disposition:progressive  Goals of Care:  Last date of multidisciplinary goals of care discussion: Family and staff present:  Summary of discussion:  Follow up goals of care discussion due:  Code Status: full code  Labs   CBC: Recent Labs  Lab 02/07/2020 1356 01/28/20 0115 01/29/20 0108 01/30/20 1052 01/31/20 0107  WBC 3.7* 5.4 8.0 10.9* 8.4  NEUTROABS  --  4.7 7.2 9.5* 7.2  HGB 11.7* 10.4* 10.6* 11.1* 10.6*  HCT 34.4* 31.7*  31.4* 33.1* 30.1*  MCV 93.2 93.8 92.6 92.2 92.6  PLT 195 195 229 266 265    Basic Metabolic Panel: Recent Labs  Lab 01/27/20 0610 01/28/20 0115 01/29/20 0108 01/30/20 1052 01/31/20 0107  NA 134* 134* 133* 134* 135  K 3.8 3.9 3.9 3.4* 3.8  CL 105 107 106 106 106  CO2 17* 18* 17* 19* 20*  GLUCOSE 192* 252* 219* 109* 101*   BUN 14 24* 31* 25* 22  CREATININE 0.77 0.97 1.06 0.95 0.91  CALCIUM 7.7* 7.8* 7.9* 8.1* 7.8*   GFR: Estimated Creatinine Clearance: 68.2 mL/min (by C-G formula based on SCr of 0.91 mg/dL). Recent Labs  Lab 02/07/2020 1611 02/15/2020 1814 01/27/20 0412 01/28/20 0115 01/29/20 0108 01/30/20 1052 01/31/20 0107 01/31/20 0506  PROCALCITON 0.17  --  0.13  --   --   --   --  <0.10  WBC  --   --   --  5.4 8.0 10.9* 8.4  --   LATICACIDVEN 1.2 1.5  --   --   --   --   --   --     Liver Function Tests: Recent Labs  Lab 02/14/2020 1611 01/28/20 0115 01/29/20 0108 01/30/20 1052 01/31/20 0107  AST 30 27 23 29 23   ALT 18 21 22 31 25   ALKPHOS 31* 27* 32* 36* 33*  BILITOT 0.9 0.6 0.5 0.7 0.7  PROT 5.7* 5.6* 5.5* 5.7* 5.3*  ALBUMIN 2.8* 2.5* 2.5* 2.6* 2.4*   Recent Labs  Lab 01/25/2020 1356  LIPASE 46   No results for input(s): AMMONIA in the last 168 hours.  ABG    Component Value Date/Time   PHART 7.483 (H) 01/31/2020 0350   PCO2ART 26.2 (L) 01/31/2020 0350   PO2ART 188 (H) 01/31/2020 0350   HCO3 19.4 (L) 01/31/2020 0350   ACIDBASEDEF 3.5 (H) 01/31/2020 0350   O2SAT 98.9 01/31/2020 0350     Coagulation Profile: No results for input(s): INR, PROTIME in the last 168 hours.  Cardiac Enzymes: No results for input(s): CKTOTAL, CKMB, CKMBINDEX, TROPONINI in the last 168 hours.  HbA1C: No results found for: HGBA1C  CBG: Recent Labs  Lab 01/30/20 1803 01/30/20 2243 01/31/20 0747 01/31/20 0928 01/31/20 1231  GLUCAP 136* 136* 57* 78 153*    Review of Systems:   Negative except as noted in HPI  Past Medical History:  He,  has a past medical history of Arthritis, Atrial fibrillation (HCC), Coronary artery disease, DM type 2 (diabetes mellitus, type 2) (HCC), Hypertension, and Rheumatoid arteritis (HCC).   Surgical History:   Past Surgical History:  Procedure Laterality Date  . CARDIAC CATHETERIZATION    . CORONARY ANGIOPLASTY    . HIP SURGERY    . HIP SURGERY        Social History:   reports that he has quit smoking. He has never used smokeless tobacco. He reports that he does not drink alcohol and does not use drugs.   Family History:  His family history includes Diabetes Mellitus II in his maternal grandmother.   Allergies No Known Allergies   03/30/20, MD [place] [facility] Pager: [phone]

## 2020-01-31 NOTE — Progress Notes (Signed)
RT called to room d/t pt dropping sat to mid to lower 80s.  Pt was asked to prone but argued that it wouldn't help.  RT x 2 explained benefit of proning but pt states he cannot lie on his stomach.  Pt has turned on his left side at this time which increase sat to 89%.  RT has now placed NRB over salter at 15lpm because pt is a mouth breather.  Sats have increased now to 95% and pt is resting comfortably.  RT will continue to monitor.

## 2020-01-31 NOTE — Progress Notes (Signed)
[facility] Progress Note  Patient: Javier Beard    EAV:[id number]  DOA: 02/13/2020     Date of Service: the patient was seen and examined on 01/31/2020  Brief hospital course: Possible due to history of rheumatoid arthritis, type II DM, A. fib, chronic HFpEF.  Presents with complaints of cough and shortness of breath.  Found to have COVID-19 pneumonia with hypoxia Currently plan is supportive care.  Assessment and Plan: 1. Acute Hypoxic respiratory failure, POA, 92 % on room air on admission.  Acute COVID-19 Viral Pneumonia CXR: hazy bilateral peripheral opacities Oxygen requirement: Overnight on 1/11 patient required 15 LPM likely due to faulty probe.  Currently on 2 LPM at rest CRP: 3.2 Remdesivir: Completed on 1/11 Steroids: On oral prednisone right now Baricitinib/Actemra: Not indicated Antibiotics: Not indicated DVT Prophylaxis:  apixaban (ELIQUIS) tablet 5 mg  Prone positioning and incentive spirometer use recommended.  Overall plan: Monitor overnight for stability on oxygen.  Home oxygen evaluation on 1/13.  Likely home tomorrow on 1/13.  The treatment plan and use of medications and known side effects were discussed with patient/family. It was clearly explained that complete risks and long-term side effects are unknown. Patient/family agree with the treatment plan.    2.  Rheumatoid arthritis Continue Plaquenil. Monitor.  Holding methotrexate.  3.  Hyponatremia Corrected. Monitor   4.  Type 2 diabetes mellitus uncontrolled with hyperglycemia and hypoglycemia Currently on sliding scale insulin prednisone discontinue glipizide.  5.  Permanent A. fib On anticoagulation with Eliquis.  Rate controlled.  6.  Chronic HFpEF Monitor.  Diet: Carb modified diet DVT Prophylaxis:    apixaban (ELIQUIS) tablet 5 mg    Advance goals of care discussion: Full code  Family Communication: no family was present at bedside, at the time of interview.  Discussed with  daughter on the phone.  The pt provided permission to discuss medical plan with the family. Opportunity was given to ask question and all questions were answered satisfactorily.   Disposition:  Status is: Inpatient  Remains inpatient appropriate because:Inpatient level of care appropriate due to severity of illness  Dispo: The patient is from: Home              Anticipated d/c is to: Home              Anticipated d/c date is: 2 days              Patient currently is not medically stable to d/c.  Subjective: No nausea no vomiting.  No fever no chills.  Continues to have shortness of breath continues to have cough.  Physical Exam:  General: Appear in mild distress, no Rash; Oral Mucosa Clear, moist. no Abnormal Neck Mass Or lumps, Conjunctiva normal  Cardiovascular: S1 and S2 Present, no Murmur, Respiratory: good respiratory effort, Bilateral Air entry present and CTA, no Crackles, no wheezes Abdomen: Bowel Sound present, Soft and no tenderness Extremities: no Pedal edema Neurology: alert and oriented to time, place, and person affect appropriate. no new focal deficit Gait not checked due to patient safety concerns  Vitals:   01/31/20 0600 01/31/20 1230 01/31/20 1602 01/31/20 2019  BP: 135/65 128/67 119/69 (!) 124/59  Pulse: 98 92 85 86  Resp: Temp: 98 F (36.7 C) 98 F (36.7 C) 98.1 F (36.7 C) 98.3 F (36.8 C)  TempSrc:  Oral Oral Axillary  SpO2: 92% 99% 95% 93%  Weight:      Height:  Intake/Output Summary (Last 24 hours) at 01/31/2020 2120 Last data filed at 01/31/2020 1556 Gross per 24 hour  Intake 720 ml  Output 1350 ml  Net -630 ml   Filed Weights   02/17/2020 1316  Weight: 81.6 kg    Data Reviewed: I have personally reviewed and interpreted daily labs, tele strips, imaging. I reviewed all nursing notes, pharmacy notes, vitals, pertinent old records I have discussed plan of care as described above with RN and patient/family.  CBC: Recent  Labs  Lab 02/16/2020 1356 01/28/20 0115 01/29/20 0108 01/30/20 1052 01/31/20 0107  WBC 3.7* 5.4 8.0 10.9* 8.4  NEUTROABS  --  4.7 7.2 9.5* 7.2  HGB 11.7* 10.4* 10.6* 11.1* 10.6*  HCT 34.4* 31.7* 31.4* 33.1* 30.1*  MCV 93.2 93.8 92.6 92.2 92.6  PLT 195 195 229 266 265   Basic Metabolic Panel: Recent Labs  Lab 01/27/20 0610 01/28/20 0115 01/29/20 0108 01/30/20 1052 01/31/20 0107  NA 134* 134* 133* 134* 135  K 3.8 3.9 3.9 3.4* 3.8  CL 105 107 106 106 106  CO2 17* 18* 17* 19* 20*  GLUCOSE 192* 252* 219* 109* 101*  BUN 14 24* 31* 25* 22  CREATININE 0.77 0.97 1.06 0.95 0.91  CALCIUM 7.7* 7.8* 7.9* 8.1* 7.8*    Studies: DG Chest Port 1 View  Result Date: 01/31/2020 CLINICAL DATA:  Rapid response, acute respiratory failure due to COVID 19 EXAM: PORTABLE CHEST 1 VIEW COMPARISON:  Radiograph 02/15/2020, CT 02/03/2019 FINDINGS: Increasing heterogeneous opacities throughout both lungs, most focally coalescent in the left lung periphery and bilateral bases. No pneumothorax. No visible pleural effusion. Stable cardiomegaly. Hiatal hernia again noted. Remaining cardiomediastinal contours are unremarkable. No acute osseous or soft tissue abnormality. Degenerative changes are present in the imaged spine and shoulders. Telemetry leads overlie the chest. IMPRESSION: Increasing heterogeneous opacities throughout both lungs, concerning for worsening infection in the setting of COVID-19 Stable cardiomegaly. Stable hiatal hernia. Electronically Signed   By: Kreg Shropshire M.D.   On: 01/31/2020 03:59   ECHOCARDIOGRAM COMPLETE  Result Date: 01/31/2020    ECHOCARDIOGRAM REPORT   Patient Name:   Javier Beard Date of Exam: 01/31/2020 Medical Rec #:  [id number]     Height:       66.0 in Accession #:    [id number]    Weight:       180.0 lb Date of Birth:  November 09, 1940     BSA:          1.912 m Patient Age:    79 years      BP:           135/65 mmHg Patient Gender: M             HR:           75 bpm. Exam Location:   Inpatient Procedure: 2D Echo, Color Doppler and Cardiac Doppler Indications:    Dyspnea R06.00  History:        Patient has no prior history of Echocardiogram examinations.                 CAD, Arrythmias:Atrial Fibrillation; Risk Factors:Hypertension                 and Diabetes.  Sonographer:    Eulah Pont RDCS Referring Phys: [id number] Aliene Beams  Sonographer Comments: No subcostal window. IMPRESSIONS  1. Left ventricular ejection fraction, by estimation, is 60 to 65%. The left ventricle has normal function. The left ventricle has  no regional wall motion abnormalities. Left ventricular diastolic parameters are indeterminate.  2. Right ventricular systolic function is normal. The right ventricular size is normal. There is mildly elevated pulmonary artery systolic pressure.  3. The pericardial effusion is posterior to the left ventricle.  4. The mitral valve is abnormal. Mild mitral valve regurgitation. No evidence of mitral stenosis.  5. Tricuspid valve regurgitation is moderate.  6. The aortic valve is normal in structure. Aortic valve regurgitation is trivial. Mild to moderate aortic valve sclerosis/calcification is present, without any evidence of aortic stenosis.  7. The inferior vena cava is normal in size with greater than 50% respiratory variability, suggesting right atrial pressure of 3 mmHg. FINDINGS  Left Ventricle: Left ventricular ejection fraction, by estimation, is 60 to 65%. The left ventricle has normal function. The left ventricle has no regional wall motion abnormalities. The left ventricular internal cavity size was normal in size. There is  no left ventricular hypertrophy. Left ventricular diastolic parameters are indeterminate. Right Ventricle: The right ventricular size is normal. No increase in right ventricular wall thickness. Right ventricular systolic function is normal. There is mildly elevated pulmonary artery systolic pressure. The tricuspid regurgitant velocity is 3.01  m/s,  and with an assumed right atrial pressure of 3 mmHg, the estimated right ventricular systolic pressure is 39.2 mmHg. Left Atrium: Left atrial size was normal in size. Right Atrium: Right atrial size was normal in size. Pericardium: Trivial pericardial effusion is present. The pericardial effusion is posterior to the left ventricle. Mitral Valve: The mitral valve is abnormal. There is mild thickening of the mitral valve leaflet(s). There is mild calcification of the mitral valve leaflet(s). Mild mitral annular calcification. Mild mitral valve regurgitation. No evidence of mitral valve stenosis. Tricuspid Valve: The tricuspid valve is normal in structure. Tricuspid valve regurgitation is moderate . No evidence of tricuspid stenosis. Aortic Valve: The aortic valve is normal in structure. Aortic valve regurgitation is trivial. Aortic regurgitation PHT measures 586 msec. Mild to moderate aortic valve sclerosis/calcification is present, without any evidence of aortic stenosis. Pulmonic Valve: The pulmonic valve was normal in structure. Pulmonic valve regurgitation is not visualized. No evidence of pulmonic stenosis. Aorta: The aortic root is normal in size and structure. Venous: The inferior vena cava is normal in size with greater than 50% respiratory variability, suggesting right atrial pressure of 3 mmHg. IAS/Shunts: The interatrial septum was not well visualized.  LEFT VENTRICLE PLAX 2D LVIDd:         4.80 cm LVIDs:         3.00 cm LV PW:         1.00 cm LV IVS:        1.10 cm LVOT diam:     2.00 cm LV SV:         70 LV SV Index:   37 LVOT Area:     3.14 cm  RIGHT VENTRICLE RV S prime:     18.00 cm/s TAPSE (M-mode): 2.1 cm LEFT ATRIUM              Index       RIGHT ATRIUM           Index LA diam:        3.10 cm  1.62 cm/m  RA Area:     15.50 cm LA Vol (A2C):   105.0 ml 54.91 ml/m RA Volume:   35.90 ml  18.77 ml/m LA Vol (A4C):   124.0 ml 64.85 ml/m LA Biplane Vol:  116.0 ml 60.66 ml/m  AORTIC VALVE LVOT Vmax:    119.33 cm/s LVOT Vmean:  88.900 cm/s LVOT VTI:    0.224 m AI PHT:      586 msec  AORTA Ao Root diam: 3.40 cm Ao Asc diam:  3.40 cm TRICUSPID VALVE TR Peak grad:   36.2 mmHg TR Vmax:        301.00 cm/s  SHUNTS Systemic VTI:  0.22 m Systemic Diam: 2.00 cm Charlton Haws MD Electronically signed by Charlton Haws MD Signature Date/Time: 01/31/2020/2:32:41 PM    Final     Scheduled Meds: . apixaban  5 mg Oral BID  . vitamin C  500 mg Oral Daily  . calcium carbonate  1,250 mg Oral Q breakfast  . calcium carbonate  0.5 tablet Oral Q supper  . doxazosin  1 mg Oral QHS  . folic acid  1 mg Oral Daily  . furosemide  60 mg Oral Daily  . hydroxychloroquine  200 mg Oral BID  . insulin aspart  0-9 Units Subcutaneous TID WC  . losartan  50 mg Oral BID  . pantoprazole  40 mg Oral Daily  . potassium chloride  10 mEq Oral BID  . pravastatin  80 mg Oral q1800  . predniSONE  50 mg Oral Daily  . vitamin B-12  1,000 mcg Oral Daily  . zinc sulfate  220 mg Oral Daily   Continuous Infusions: PRN Meds: acetaminophen **OR** acetaminophen, albuterol, butalbital-acetaminophen-caffeine, chlorpheniramine-HYDROcodone, guaiFENesin-dextromethorphan, methocarbamol  Time spent: 35 minutes  Author: Lynden Oxford, MD [facility] 01/31/2020 9:20 PM  To reach On-call, see care teams to locate the attending and reach out via [email or web address]. Between 7PM-7AM, please contact night-coverage If you still have difficulty reaching the attending provider, please page the [facility] (Director on Call) for [facility] on amion for assistance.

## 2020-02-01 ENCOUNTER — Other Ambulatory Visit (HOSPITAL_BASED_OUTPATIENT_CLINIC_OR_DEPARTMENT_OTHER): Payer: Self-pay | Admitting: Internal Medicine

## 2020-02-01 DIAGNOSIS — U071 COVID-19: Secondary | ICD-10-CM | POA: Diagnosis not present

## 2020-02-01 DIAGNOSIS — J9601 Acute respiratory failure with hypoxia: Secondary | ICD-10-CM | POA: Diagnosis not present

## 2020-02-01 LAB — CBC WITH DIFFERENTIAL/PLATELET
Abs Immature Granulocytes: 0.11 10*3/uL — ABNORMAL HIGH (ref 0.00–0.07)
Basophils Absolute: 0 10*3/uL (ref 0.0–0.1)
Basophils Relative: 0 %
Eosinophils Absolute: 0.1 10*3/uL (ref 0.0–0.5)
Eosinophils Relative: 1 %
HCT: 30.5 % — ABNORMAL LOW (ref 39.0–52.0)
Hemoglobin: 10.8 g/dL — ABNORMAL LOW (ref 13.0–17.0)
Immature Granulocytes: 1 %
Lymphocytes Relative: 10 %
Lymphs Abs: 0.7 10*3/uL (ref 0.7–4.0)
MCH: 31.9 pg (ref 26.0–34.0)
MCHC: 35.4 g/dL (ref 30.0–36.0)
MCV: 90 fL (ref 80.0–100.0)
Monocytes Absolute: 0.5 10*3/uL (ref 0.1–1.0)
Monocytes Relative: 7 %
Neutro Abs: 6.4 10*3/uL (ref 1.7–7.7)
Neutrophils Relative %: 81 %
Platelets: 257 10*3/uL (ref 150–400)
RBC: 3.39 MIL/uL — ABNORMAL LOW (ref 4.22–5.81)
RDW: 14.9 % (ref 11.5–15.5)
WBC: 7.8 10*3/uL (ref 4.0–10.5)
nRBC: 0 % (ref 0.0–0.2)

## 2020-02-01 LAB — GLUCOSE, CAPILLARY
Glucose-Capillary: 83 mg/dL (ref 70–99)
Glucose-Capillary: 140 mg/dL — ABNORMAL HIGH (ref 70–99)
Glucose-Capillary: 211 mg/dL — ABNORMAL HIGH (ref 70–99)
Glucose-Capillary: 250 mg/dL — ABNORMAL HIGH (ref 70–99)

## 2020-02-01 LAB — COMPREHENSIVE METABOLIC PANEL
ALT: 21 U/L (ref 0–44)
AST: 19 U/L (ref 15–41)
Albumin: 2.3 g/dL — ABNORMAL LOW (ref 3.5–5.0)
Alkaline Phosphatase: 37 U/L — ABNORMAL LOW (ref 38–126)
Anion gap: 11 (ref 5–15)
BUN: 19 mg/dL (ref 8–23)
CO2: 20 mmol/L — ABNORMAL LOW (ref 22–32)
Calcium: 7.7 mg/dL — ABNORMAL LOW (ref 8.9–10.3)
Chloride: 103 mmol/L (ref 98–111)
Creatinine, Ser: 0.84 mg/dL (ref 0.61–1.24)
GFR, Estimated: >60 mL/min (ref >60)
Glucose, Bld: 110 mg/dL — ABNORMAL HIGH (ref 70–99)
Potassium: 3.3 mmol/L — ABNORMAL LOW (ref 3.5–5.1)
Sodium: 134 mmol/L — ABNORMAL LOW (ref 135–145)
Total Bilirubin: 0.8 mg/dL (ref 0.3–1.2)
Total Protein: 5.3 g/dL — ABNORMAL LOW (ref 6.5–8.1)

## 2020-02-01 LAB — D-DIMER, QUANTITATIVE: D-Dimer, Quant: 2.17 ug/mL-FEU — ABNORMAL HIGH (ref 0.00–0.50)

## 2020-02-01 LAB — C-REACTIVE PROTEIN: CRP: 13.5 mg/dL — ABNORMAL HIGH (ref <1.0)

## 2020-02-01 MED ORDER — PREDNISONE 20 MG PO TABS
40.0000 mg | ORAL_TABLET | Freq: Two times a day (BID) | ORAL | Status: DC
  Administered 2020-02-01 – 2020-02-02 (×3): 40 mg via ORAL
  Filled 2020-02-01 (×3): qty 2

## 2020-02-01 MED ORDER — PREDNISONE 10 MG PO TABS: ORAL_TABLET | ORAL | 0 refills | Status: DC

## 2020-02-01 MED ORDER — ZINC SULFATE 220 (50 ZN) MG PO CAPS: 220.0000 mg | ORAL_CAPSULE | Freq: Every day | ORAL | 0 refills | Status: DC

## 2020-02-01 MED ORDER — POTASSIUM CHLORIDE CRYS ER 20 MEQ PO TBCR
40.0000 meq | EXTENDED_RELEASE_TABLET | Freq: Once | ORAL | Status: AC
  Administered 2020-02-01: 40 meq via ORAL
  Filled 2020-02-01: qty 2

## 2020-02-01 MED ORDER — ASCORBIC ACID 500 MG PO TABS: 500.0000 mg | ORAL_TABLET | Freq: Every day | ORAL | 0 refills | Status: DC

## 2020-02-01 MED ORDER — ALBUTEROL SULFATE HFA 108 (90 BASE) MCG/ACT IN AERS: 2.0000 | INHALATION_SPRAY | RESPIRATORY_TRACT | 0 refills | Status: DC | PRN

## 2020-02-01 MED FILL — PROAIR HFA 90 MCG INHALER: 108 (90 BAS | 16 days supply | Qty: 9 | Fill #0

## 2020-02-01 MED FILL — VITAMIN C 500 MG TABLET: 500 | 100 days supply | Qty: 100 | Fill #0

## 2020-02-01 NOTE — Progress Notes (Signed)
Patient unable to tolerate ambulation trial. Patient 86% on 6L of oxygen. Patient stood for ambulation trial and stated he felt to weak to go any further. Patient placed on 7L HFNC. MD aware. Will continue to monitor.

## 2020-02-01 NOTE — Progress Notes (Signed)
   NAME:  Javier Beard, MRN:  [id number], DOB:  [birth year], LOS: 5 ADMISSION DATE:  2020/02/23, CONSULTATION DATE:  02/01/20 REFERRING MD:  Dr. Loney Loh, CHIEF COMPLAINT:  Worsening dyspnea   Brief History:  80 y.o. 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  PCCM consulted for possible bipap He has progressively improved from 1/12 when consult was initially called  Past Medical History:   has a past medical history of Arthritis, Atrial fibrillation (HCC), Coronary artery disease, DM type 2 (diabetes mellitus, type 2) (HCC), Hypertension, and Rheumatoid arteritis (HCC).   Significant Hospital Events:  1/8 Admitted 1/12 PCCM consult, transfer to progressive   Consults:  PCCM  Procedures:    Significant Diagnostic Tests:  1/12 CXR>>Increasing heterogeneous opacities throughout both lungs, concerning for worsening infection in the setting of COVID-19  Micro Data:  Blood culture 02-23-20-no growth  Antimicrobials:   Remdesevir 1/7-1/11  Interim History / Subjective:  Comfortable in bed this afternoon Had his oxygen off his nose-saturating in the 90s  Objective   Blood pressure (!) 128/55, pulse 99, temperature 99.1 F (37.3 C), temperature source Axillary, resp. rate 20, height 5\' 6"  (1.676 m), weight 81.6 kg, SpO2 (!) 86 %.        Intake/Output Summary (Last 24 hours) at 02/01/2020 1004 Last data filed at 02/01/2020 02/03/2020 Gross per 24 hour  Intake 360 ml  Output 1300 ml  Net -940 ml   Filed Weights   2020/02/23 1316  Weight: 81.6 kg    General: Elderly, does not appear to be in distress HEENT: Moist oral mucosa Neuro: Moving all extremities with no focal findings CV: S1-S2 appreciated PULM: Coarse breath sounds bilaterally GI: soft, bsx4 active  Extremities:  Lower extremity edema Skin: no rashes or lesions   Resolved Hospital Problem list     Assessment & Plan:   Acute hypoxemic respiratory failure secondary to COVID-19 pneumonia  POCUS cardiac 03/25/20 done by Dr. [place] 01/31/20 with hyperdynamic RV without enlargement, EF does not appear reduced.  Pt is on Eliquis making PE unlikely.  No femoral DVT appreciated with POCUS.  Continue oxygen supplementation as needed -I was able to take him off oxygen supplementation and saturations were maintained between 95 and 96 percent -He did remain comfortable -May still need minimal supplementation but at present saturating adequately on room air  Risk of decompensation does exist with his COVID infection however he is stable at the present time  Critical care will sign off  Call as needed  03/30/20, MD [place] [facility] Pager: [phone]

## 2020-02-01 NOTE — Progress Notes (Signed)
Physical Therapy Treatment Patient Details Name: Javier Beard MRN: [id number] DOB: [birth year] Today's Date: 02/01/2020    History of Present Illness 80 year old man PMH including rheumatoid arthritis on DMARD, diabetes mellitus type 2, permanent atrial fibrillation, chronic bilateral lower extremity edema presented with increasing shortness of breath, nausea, vomiting and diarrhea.  Symptoms for some time.  Low-grade temp in the emergency department.  Chest x-ray showed bilateral infiltrates, Covid test positive.  Admitted for acute hypoxic respiratory failure secondary to Covid pneumonia.    PT Comments    Pt is very self limiting today.  Only agreeable to stand once with RW and therapist.  Seemed to get irritable with my questions about home.  He had a difficult time confirming details, "I don't know!".  O2 sats on 8L O2 HFNC were down to 85% with standing mobility.  PT will continue to follow acutely for safe mobility progression.   Follow Up Recommendations  Home health PT;Supervision/Assistance - 24 hour     Equipment Recommendations  Other (comment) (home O2)    Recommendations for Other Services       Precautions / Restrictions Precautions Precautions: Fall    Mobility  Bed Mobility               General bed mobility comments: Pt was OOB in the recliner chair.  Transfers Overall transfer level: Needs assistance Equipment used: Rolling walker (2 wheeled) Transfers: Sit to/from Stand Sit to Stand: Min assist         General transfer comment: Min assist to power up and steady pt for balance,  Ambulation/Gait             General Gait Details: pt declined to walk   Stairs             Wheelchair Mobility    Modified Rankin (Stroke Patients Only)       Balance Overall balance assessment: Needs assistance Sitting-balance support: Feet supported;Bilateral upper extremity supported Sitting balance-Leahy Scale: Good     Standing balance  support: Bilateral upper extremity supported Standing balance-Leahy Scale: Poor Standing balance comment: needs external support from RW and therapist.  Pt stood ~1 min before he reported he needed to sit, he could not express why, SOB or LE fatigue, lightheadedness "I don't know!" and then refused to attempt again or try walking during second trial (which he refused to preform).                            Cognition Arousal/Alertness: Awake/alert Behavior During Therapy: WFL for tasks assessed/performed Overall Cognitive Status: No family/caregiver present to determine baseline cognitive functioning                                 General Comments: Unsure of baseline, but often aswers my questions about home or symptoms with "I don't know" easily frustrated when asked to do things.      Exercises      General Comments General comments (skin integrity, edema, etc.): O2 sats decreased to 85% on 8L O2 HFNC      Pertinent Vitals/Pain Pain Assessment: No/denies pain    Home Living                      Prior Function            PT Goals (current goals can now be found  in the care plan section) Acute Rehab PT Goals Patient Stated Goal: pt wants to go home Progress towards PT goals: Progressing toward goals    Frequency    Min 3X/week      PT Plan Current plan remains appropriate    Co-evaluation              AM-PAC PT "6 Clicks" Mobility   Outcome Measure  Help needed turning from your back to your side while in a flat bed without using bedrails?: A Little Help needed moving from lying on your back to sitting on the side of a flat bed without using bedrails?: A Little Help needed moving to and from a bed to a chair (including a wheelchair)?: A Little Help needed standing up from a chair using your arms (e.g., wheelchair or bedside chair)?: A Little Help needed to walk in hospital room?: A Little Help needed climbing 3-5 steps  with a railing? : A Lot 6 Click Score: 17    End of Session Equipment Utilized During Treatment: Oxygen Activity Tolerance: Patient limited by fatigue;Other (comment) (self limiting) Patient left: in chair;with call bell/phone within reach   PT Visit Diagnosis: Unsteadiness on feet (R26.81);Other abnormalities of gait and mobility (R26.89)     Time: 8372-9021 PT Time Calculation (min) (ACUTE ONLY): 26 min  Charges:  $Therapeutic Activity: 23-37 mins                     Corinna Capra, PT, DPT  Acute Rehabilitation [phone] pager [phone]) [phone] office

## 2020-02-01 NOTE — Progress Notes (Addendum)
[facility] Progress Note  Patient: Javier Beard    DXI:[id number]  DOA: 2020-01-30     Date of Service: the patient was seen and examined on 02/01/2020  Brief hospital course: Possible due to history of rheumatoid arthritis, type II DM, A. fib, chronic HFpEF.  Presents with complaints of cough and shortness of breath.  Found to have COVID-19 pneumonia with hypoxia Currently plan is supportive care.  Assessment and Plan: 1. Acute Hypoxic respiratory failure, POA, 92 % on room air on admission.  Acute COVID-19 Viral Pneumonia CXR: hazy bilateral peripheral opacities Oxygen requirement: Overnight on 1/11 patient required 15 LPM likely due to faulty probe.  Currently on 2 LPM at rest, back up on 8 L of oxygen on exertion CRP: 3.2, back up to 13.5 on 1/13 Remdesivir: Completed on 1/11 Steroids: On oral prednisone right now dose increase to prednisone 40 mg twice daily on 1/13 due to elevated CRP Baricitinib/Actemra: Not indicated, will discuss with patient if no improvement tomorrow. Antibiotics: Not indicated DVT Prophylaxis:  apixaban (ELIQUIS) tablet 5 mg  Prone positioning and incentive spirometer use recommended.  Overall plan: Continue monitor.  Oxygen improvement after 6 L on exertion is needed for safe for discharge to home.   The treatment plan and use of medications and known side effects were discussed with patient/family. It was clearly explained that complete risks and long-term side effects are unknown. Patient/family agree with the treatment plan.    2.  Rheumatoid arthritis Continue Plaquenil. Monitor.  Holding methotrexate.  3.  Hyponatremia Corrected. Monitor   4.  Type 2 diabetes mellitus uncontrolled with hyperglycemia and hypoglycemia Currently on sliding scale insulin prednisone discontinue glipizide.  5.  Permanent A. fib On anticoagulation with Eliquis.  Rate controlled.  6.  Chronic HFpEF Monitor.  Diet: Carb modified diet DVT Prophylaxis:     apixaban (ELIQUIS) tablet 5 mg    Advance goals of care discussion: Full code  Family Communication: no family was present at bedside, at the time of interview.   Disposition:  Status is: Inpatient  Remains inpatient appropriate because:Inpatient level of care appropriate due to severity of illness  Dispo: The patient is from: Home              Anticipated d/c is to: Home              Anticipated d/c date is: 2 days              Patient currently is not medically stable to d/c.  Subjective: No nausea no vomiting.  No fever no chills but reportedly had a some increase shortness of breath this morning requiring 6 to 8 L of oxygen on exertion. On room air the patient is able to come down to almost no oxygen for 89% saturation.  Physical Exam:  General: Appear in mild distress, no Rash; Oral Mucosa Clear, moist. no Abnormal Neck Mass Or lumps, Conjunctiva normal  Cardiovascular: S1 and S2 Present, no Murmur, Respiratory: increased respiratory effort, Bilateral Air entry present and bilateral  Crackles, no wheezes Abdomen: Bowel Sound present, Soft and no tenderness Extremities: trace Pedal edema Neurology: alert and oriented to time, place, and person affect appropriate. no new focal deficit Gait not checked due to patient safety concerns  Vitals:   02/01/20 0609 02/01/20 0728 02/01/20 1238 02/01/20 1748  BP:   (!) 110/59 (!) 102/58  Pulse:  99 99 95  Resp:  20 (!) 24 (!) 24  Temp:  99.1 F (  37.3 C) 98.4 F (36.9 C) 98 F (36.7 C)  TempSrc:  Axillary Axillary Oral  SpO2: 92% (!) 86% (!) 86% 91%  Weight:      Height:        Intake/Output Summary (Last 24 hours) at 02/01/2020 1834 Last data filed at 02/01/2020 4098 Gross per 24 hour  Intake 360 ml  Output 850 ml  Net -490 ml   Filed Weights   02/10/2020 1316  Weight: 81.6 kg    Data Reviewed: I have personally reviewed and interpreted daily labs, tele strips, imaging. I reviewed all nursing notes, pharmacy notes,  vitals, pertinent old records I have discussed plan of care as described above with RN and patient/family.  CBC: Recent Labs  Lab 01/28/20 0115 01/29/20 0108 01/30/20 1052 01/31/20 0107 02/01/20 0225  WBC 5.4 8.0 10.9* 8.4 7.8  NEUTROABS 4.7 7.2 9.5* 7.2 6.4  HGB 10.4* 10.6* 11.1* 10.6* 10.8*  HCT 31.7* 31.4* 33.1* 30.1* 30.5*  MCV 93.8 92.6 92.2 92.6 90.0  PLT 195 229 266 265 257   Basic Metabolic Panel: Recent Labs  Lab 01/28/20 0115 01/29/20 0108 01/30/20 1052 01/31/20 0107 02/01/20 0225  NA 134* 133* 134* 135 134*  K 3.9 3.9 3.4* 3.8 3.3*  CL 107 106 106 106 103  CO2 18* 17* 19* 20* 20*  GLUCOSE 252* 219* 109* 101* 110*  BUN 24* 31* 25* 22 19  CREATININE 0.97 1.06 0.95 0.91 0.84  CALCIUM 7.8* 7.9* 8.1* 7.8* 7.7*    Studies: No results found.  Scheduled Meds: . apixaban  5 mg Oral BID  . vitamin C  500 mg Oral Daily  . calcium carbonate  1,250 mg Oral Q breakfast  . calcium carbonate  0.5 tablet Oral Q supper  . doxazosin  1 mg Oral QHS  . folic acid  1 mg Oral Daily  . furosemide  60 mg Oral Daily  . hydroxychloroquine  200 mg Oral BID  . insulin aspart  0-9 Units Subcutaneous TID WC  . losartan  50 mg Oral BID  . pantoprazole  40 mg Oral Daily  . pravastatin  80 mg Oral q1800  . predniSONE  40 mg Oral BID WC  . vitamin B-12  1,000 mcg Oral Daily  . zinc sulfate  220 mg Oral Daily   Continuous Infusions: PRN Meds: acetaminophen **OR** acetaminophen, albuterol, butalbital-acetaminophen-caffeine, chlorpheniramine-HYDROcodone, guaiFENesin-dextromethorphan, methocarbamol  Time spent: 35 minutes  Author: Lynden Oxford, MD [facility] 02/01/2020 6:34 PM  To reach On-call, see care teams to locate the attending and reach out via [email or web address]. Between 7PM-7AM, please contact night-coverage If you still have difficulty reaching the attending provider, please page the [facility] (Director on Call) for [facility] on amion for assistance.

## 2020-02-02 ENCOUNTER — Inpatient Hospital Stay (HOSPITAL_COMMUNITY): Payer: Medicare Other

## 2020-02-02 DIAGNOSIS — R0602 Shortness of breath: Secondary | ICD-10-CM

## 2020-02-02 DIAGNOSIS — U071 COVID-19: Secondary | ICD-10-CM | POA: Diagnosis not present

## 2020-02-02 DIAGNOSIS — J9601 Acute respiratory failure with hypoxia: Secondary | ICD-10-CM | POA: Diagnosis not present

## 2020-02-02 LAB — CBC WITH DIFFERENTIAL/PLATELET
Abs Immature Granulocytes: 0.16 10*3/uL — ABNORMAL HIGH (ref 0.00–0.07)
Basophils Absolute: 0 10*3/uL (ref 0.0–0.1)
Basophils Relative: 0 %
Eosinophils Absolute: 0 10*3/uL (ref 0.0–0.5)
Eosinophils Relative: 0 %
HCT: 32.1 % — ABNORMAL LOW (ref 39.0–52.0)
Hemoglobin: 11.3 g/dL — ABNORMAL LOW (ref 13.0–17.0)
Immature Granulocytes: 2 %
Lymphocytes Relative: 6 %
Lymphs Abs: 0.6 10*3/uL — ABNORMAL LOW (ref 0.7–4.0)
MCH: 32.3 pg (ref 26.0–34.0)
MCHC: 35.2 g/dL (ref 30.0–36.0)
MCV: 91.7 fL (ref 80.0–100.0)
Monocytes Absolute: 0.7 10*3/uL (ref 0.1–1.0)
Monocytes Relative: 7 %
Neutro Abs: 8.2 10*3/uL — ABNORMAL HIGH (ref 1.7–7.7)
Neutrophils Relative %: 85 %
Platelets: 277 10*3/uL (ref 150–400)
RBC: 3.5 MIL/uL — ABNORMAL LOW (ref 4.22–5.81)
RDW: 14.8 % (ref 11.5–15.5)
WBC: 9.7 10*3/uL (ref 4.0–10.5)
nRBC: 0 % (ref 0.0–0.2)

## 2020-02-02 LAB — COMPREHENSIVE METABOLIC PANEL
ALT: 19 U/L (ref 0–44)
AST: 18 U/L (ref 15–41)
Albumin: 2.3 g/dL — ABNORMAL LOW (ref 3.5–5.0)
Alkaline Phosphatase: 37 U/L — ABNORMAL LOW (ref 38–126)
Anion gap: 12 (ref 5–15)
BUN: 19 mg/dL (ref 8–23)
CO2: 16 mmol/L — ABNORMAL LOW (ref 22–32)
Calcium: 7.7 mg/dL — ABNORMAL LOW (ref 8.9–10.3)
Chloride: 101 mmol/L (ref 98–111)
Creatinine, Ser: 0.86 mg/dL (ref 0.61–1.24)
GFR, Estimated: >60 mL/min (ref >60)
Glucose, Bld: 180 mg/dL — ABNORMAL HIGH (ref 70–99)
Potassium: 3.8 mmol/L (ref 3.5–5.1)
Sodium: 129 mmol/L — ABNORMAL LOW (ref 135–145)
Total Bilirubin: 0.9 mg/dL (ref 0.3–1.2)
Total Protein: 5.5 g/dL — ABNORMAL LOW (ref 6.5–8.1)

## 2020-02-02 LAB — MAGNESIUM: Magnesium: 2.1 mg/dL (ref 1.7–2.4)

## 2020-02-02 LAB — GLUCOSE, CAPILLARY
Glucose-Capillary: 178 mg/dL — ABNORMAL HIGH (ref 70–99)
Glucose-Capillary: 253 mg/dL — ABNORMAL HIGH (ref 70–99)
Glucose-Capillary: 251 mg/dL — ABNORMAL HIGH (ref 70–99)
Glucose-Capillary: 254 mg/dL — ABNORMAL HIGH (ref 70–99)
Glucose-Capillary: 215 mg/dL — ABNORMAL HIGH (ref 70–99)

## 2020-02-02 LAB — C-REACTIVE PROTEIN: CRP: 16.3 mg/dL — ABNORMAL HIGH (ref <1.0)

## 2020-02-02 IMAGING — CT CT ANGIO CHEST
2 of 6 series · 18 of 46 positions shown · IV contrast (omnipaque)
Comparison: [DATE]

CLINICAL DATA: High probability pulmonary embolism.  COVID.

EXAM:
CT ANGIOGRAPHY CHEST WITH CONTRAST
TECHNIQUE: Multidetector CT imaging of the chest was performed using the
standard protocol during bolus administration of intravenous
contrast. Multiplanar CT image reconstructions and MIPs were
obtained to evaluate the vascular anatomy.
CONTRAST:  90mL OMNIPAQUE IOHEXOL 350 MG/ML SOLN

[Series 6: thins · axial · 0.71mm/px · z∈[+899,+1193]mm · 15 of 323 slices shown]
[im 15/323  lung]
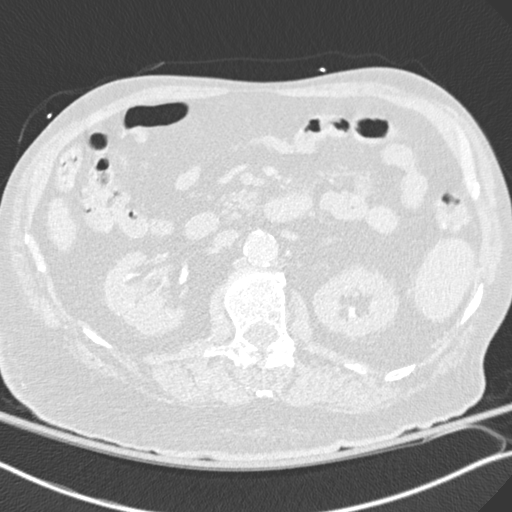
[im 43/323  soft-tissue]
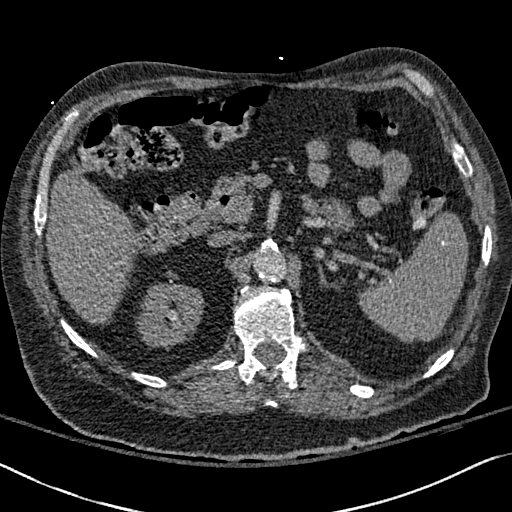
[im 57/323  lung]
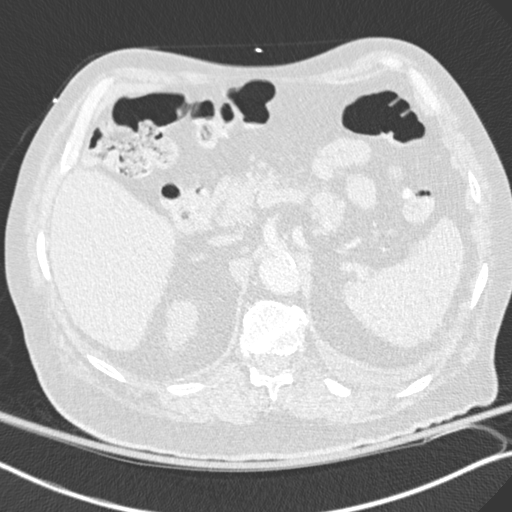
[im 85/323  soft-tissue]
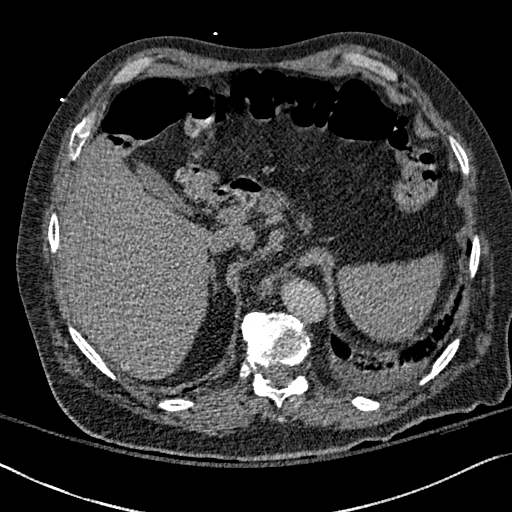
[im 99/323  lung]
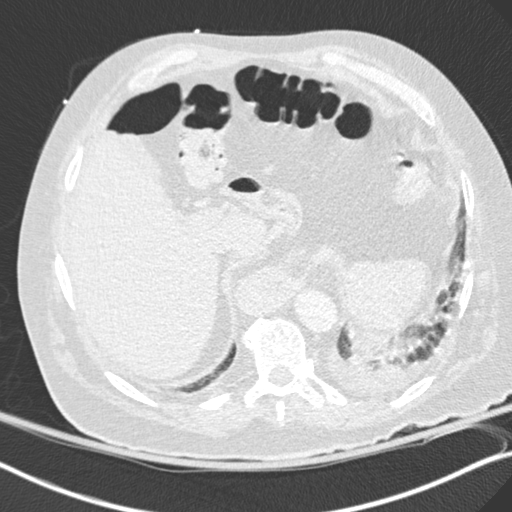
[im 127/323  soft-tissue]
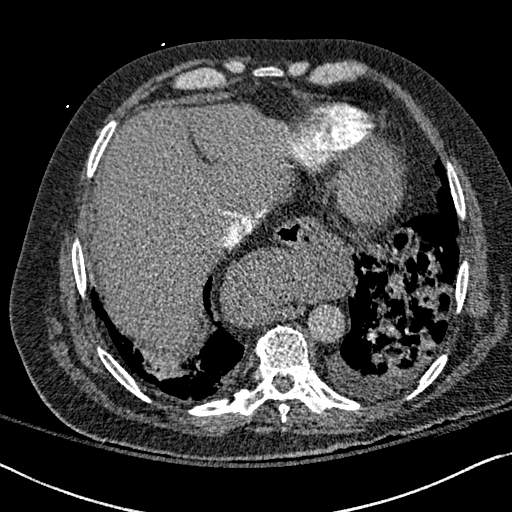
[im 141/323  lung]
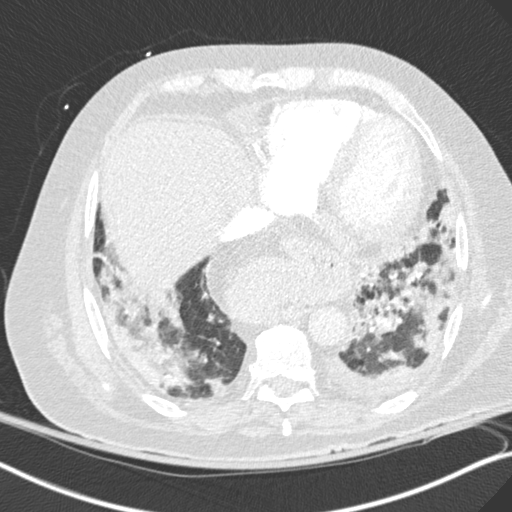
[im 169/323  soft-tissue]
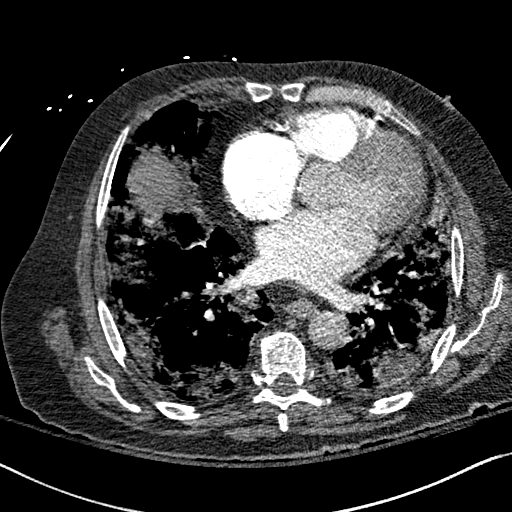
[im 183/323  lung]
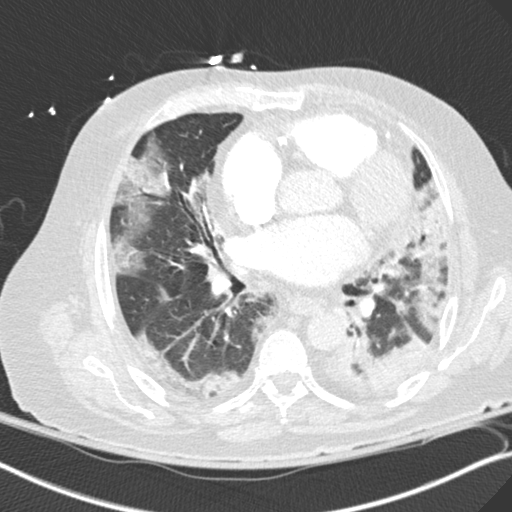
[im 197/323  soft-tissue]
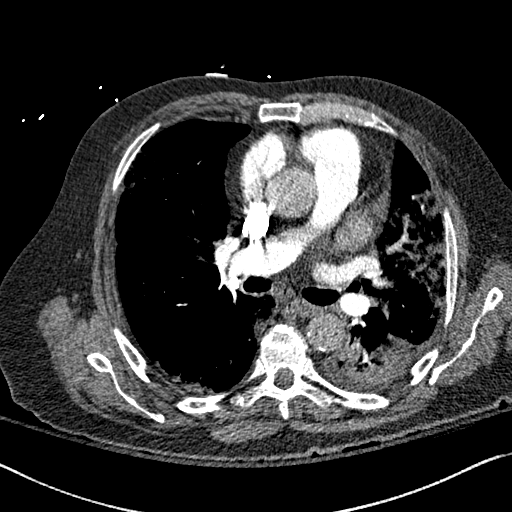
[im 225/323  lung]
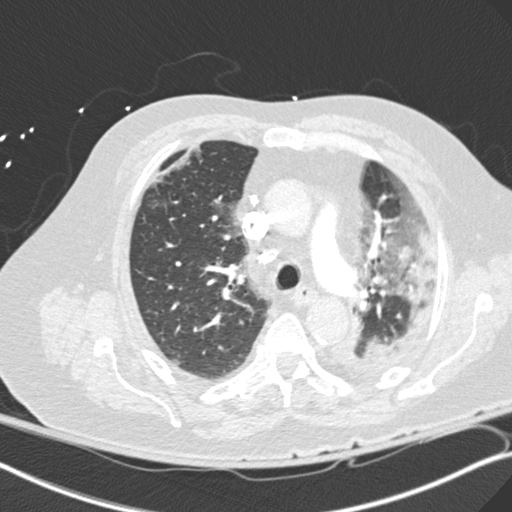
[im 239/323  soft-tissue]
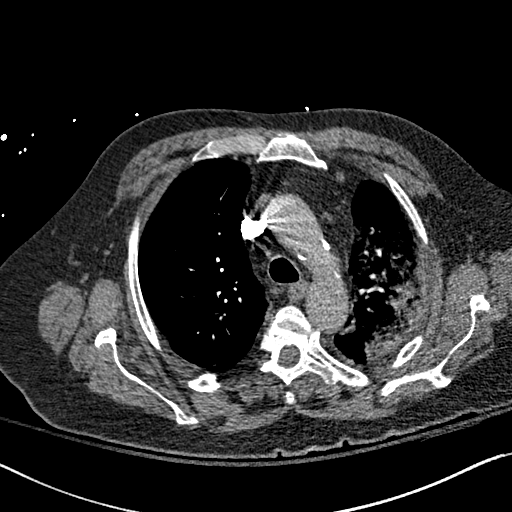
[im 267/323  lung]
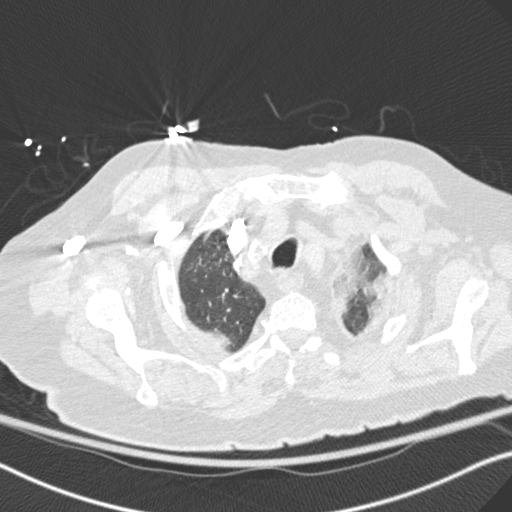
[im 281/323  soft-tissue]
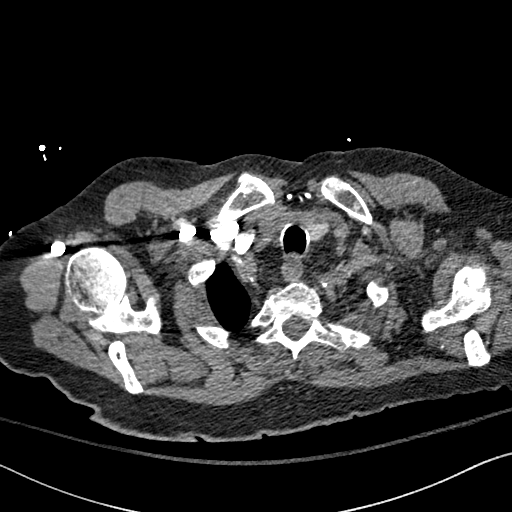
[im 309/323  lung]
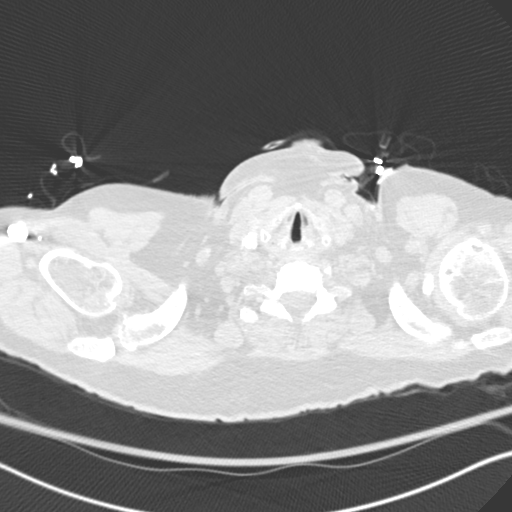

[Series 8: coronal mpr · coronal · 0.63mm/px · 3 of 151 slices shown]
[im 38/151  soft-tissue]
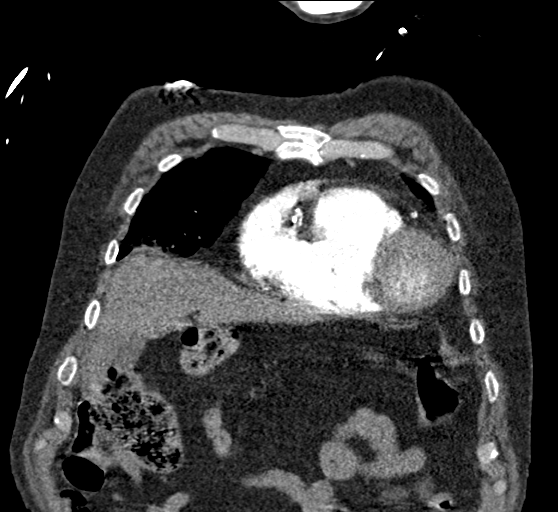
[im 76/151  soft-tissue]
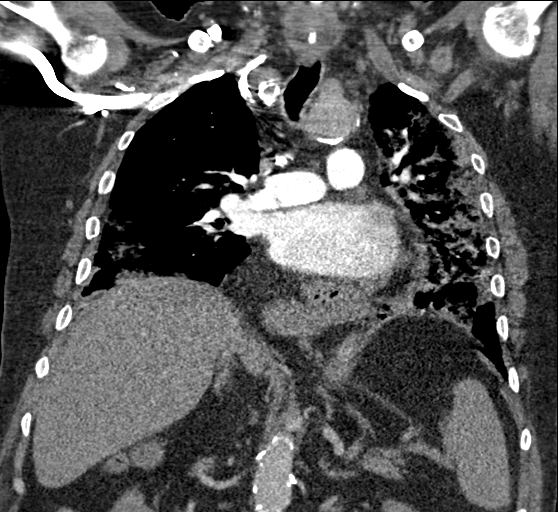
[im 113/151  soft-tissue]
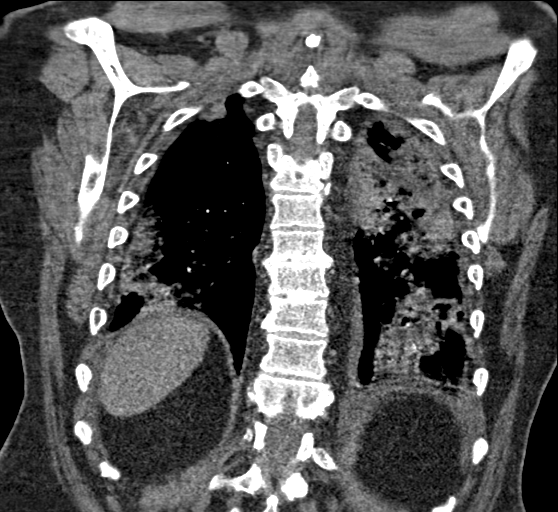

[18 of 46 positions shown; findings below may reference images not displayed]

FINDINGS: Cardiovascular: Heart size upper limits normal. Trace pericardial
fluid. Satisfactory opacification of pulmonary arteries noted, and
there is no evidence of pulmonary emboli. Breathing motion
especially through the bases degrades peripheral evaluation.
3-vessel coronary calcifications. Fair contrast opacification of the
thoracic aorta. No aneurysm, dissection, or stenosis. Classic 3
vessel brachiocephalic arterial origin anatomy with nonocclusive
calcified plaque at the left subclavian artery origin. Scattered
calcified plaque in the descending thoracic and proximal abdominal
aorta.

Mediastinum/Nodes: Large hiatal hernia containing gastric fundus and
much of the body. Calcified right paratracheal lymph node. No new
adenopathy.

Lungs/Pleura: Trace left pleural effusion. Previously noted small
right pleural effusion has resolved. No pneumothorax. Extensive
scattered patchy predominantly peripheral airspace and ground-glass
opacities most marked in both lower lobes, and the left upper lobe.

Upper Abdomen: Moderate hiatal hernia. Punctate calcified granulomas
in the spleen. No acute findings.

Musculoskeletal: Spondylitic changes in the lower cervical and
thoracic spine. Bilateral shoulder DJD. No fracture or worrisome
bone lesion.

Review of the MIP images confirms the above findings.
IMPRESSION: 1. Extensive patchy predominantly peripheral airspace and
ground-glass opacities most marked in both lower lobes and the left
upper lobe, consistent with COVID pneumonia .
2. Negative for acute PE or thoracic aortic dissection.
3. Trace left pleural effusion.
4. Large hiatal hernia.
5. Coronary and Aortic Atherosclerosis ([F8]-[F8]).

## 2020-02-02 MED ORDER — IOHEXOL 350 MG/ML SOLN
90.0000 mL | Freq: Once | INTRAVENOUS | Status: AC | PRN
  Administered 2020-02-02: 90 mL via INTRAVENOUS

## 2020-02-02 MED ORDER — METHYLPREDNISOLONE SODIUM SUCC 125 MG IJ SOLR
80.0000 mg | Freq: Two times a day (BID) | INTRAMUSCULAR | Status: DC
  Administered 2020-02-02 – 2020-02-07 (×11): 80 mg via INTRAVENOUS
  Filled 2020-02-02 (×11): qty 2

## 2020-02-02 NOTE — Progress Notes (Signed)
Patient off the unit for CT.

## 2020-02-02 NOTE — Progress Notes (Signed)
[facility] Progress Note  Patient: Javier Beard    OIZ:[id number]  DOA: February 14, 2020     Date of Service: the patient was seen and examined on 02/02/2020  Brief hospital course: Possible due to history of rheumatoid arthritis, type II DM, A. fib, chronic HFpEF.  Presents with complaints of cough and shortness of breath.  Found to have COVID-19 pneumonia with hypoxia Currently plan is supportive care.  Assessment and Plan: 1. Acute Hypoxic respiratory failure, POA, 92 % on room air on admission.  Acute COVID-19 Viral Pneumonia CXR: hazy bilateral peripheral opacities CTA chest PE protocol 1/14; negative for PE Oxygen requirement: At rest 2 LPM, on exertion 8 LPM CRP: 3.2, back up to 13.5 on 1/13 Remdesivir: Completed on 1/11 Steroids: On oral prednisone right now dose increase to IV Solu-Medrol due to elevated CRP and increasing oxygen demand Baricitinib/Actemra: Not indicated, will discuss with patient if no improvement tomorrow. Antibiotics: Not indicated DVT Prophylaxis:  apixaban (ELIQUIS) tablet 5 mg  Prone positioning and incentive spirometer use recommended.  Overall plan: Continue monitor.  Oxygen improvement after 6 L on exertion is needed for safe for discharge to home.   The treatment plan and use of medications and known side effects were discussed with patient/family. It was clearly explained that complete risks and long-term side effects are unknown. Patient/family agree with the treatment plan.    2.  Rheumatoid arthritis Continue Plaquenil. Monitor.  Holding methotrexate.  3.  Hyponatremia Corrected. Monitor   4.  Type 2 diabetes mellitus uncontrolled with hyperglycemia and hypoglycemia Currently on sliding scale insulin prednisone discontinue glipizide.  5.  Permanent A. fib On anticoagulation with Eliquis.  Rate controlled.  6.  Chronic HFpEF Orthodeoxia Monitor. Echocardiogram negative for any acute abnormality per Echocardiogram with bubble study  also negative for any intra-chamber shunt.  Diet: Carb modified diet DVT Prophylaxis:    apixaban (ELIQUIS) tablet 5 mg    Advance goals of care discussion: Full code  Family Communication: no family was present at bedside, at the time of interview.  Discussed with daughter on the phone.  Disposition:  Status is: Inpatient  Remains inpatient appropriate because:Inpatient level of care appropriate due to severity of illness  Dispo: The patient is from: Home              Anticipated d/c is to: Home              Anticipated d/c date is: 2 days              Patient currently is not medically stable to d/c.  Subjective: No nausea or vomiting but no fever no chills.  Frustrated.  Physical Exam:  General: Appear in mild distress, no Rash; Oral Mucosa Clear, moist. no Abnormal Neck Mass Or lumps, Conjunctiva normal  Cardiovascular: S1 and S2 Present, no Murmur, Respiratory: increased respiratory effort, Bilateral Air entry present and bilateral  Crackles, no wheezes Abdomen: Bowel Sound present, Soft and no tenderness Extremities: trace Pedal edema Neurology: alert and oriented to time, place, and person affect appropriate. no new focal deficit Gait not checked due to patient safety concerns  Vitals:   02/02/20 0737 02/02/20 1222 02/02/20 1630 02/02/20 2012  BP: 140/75 109/67 132/71 107/69  Pulse: 83 74 78 84  Resp: 20 20 19 16   Temp: (!) 97.4 F (36.3 C) (!) 97.5 F (36.4 C) 98.1 F (36.7 C) 98.3 F (36.8 C)  TempSrc: Oral Oral Oral Oral  SpO2:    (!) 89%  Weight:      Height:        Intake/Output Summary (Last 24 hours) at 02/02/2020 2023 Last data filed at 02/02/2020 1453 Gross per 24 hour  Intake 120 ml  Output -  Net 120 ml   Filed Weights   02-25-20 1316  Weight: 81.6 kg    Data Reviewed: I have personally reviewed and interpreted daily labs, tele strips, imaging. I reviewed all nursing notes, pharmacy notes, vitals, pertinent old records I have discussed  plan of care as described above with RN and patient/family.  CBC: Recent Labs  Lab 01/29/20 0108 01/30/20 1052 01/31/20 0107 02/01/20 0225 02/02/20 0815  WBC 8.0 10.9* 8.4 7.8 9.7  NEUTROABS 7.2 9.5* 7.2 6.4 8.2*  HGB 10.6* 11.1* 10.6* 10.8* 11.3*  HCT 31.4* 33.1* 30.1* 30.5* 32.1*  MCV 92.6 92.2 92.6 90.0 91.7  PLT 229 266 265 257 277   Basic Metabolic Panel: Recent Labs  Lab 01/29/20 0108 01/30/20 1052 01/31/20 0107 02/01/20 0225 02/02/20 0815  NA 133* 134* 135 134* 129*  K 3.9 3.4* 3.8 3.3* 3.8  CL 106 106 106 103 101  CO2 17* 19* 20* 20* 16*  GLUCOSE 219* 109* 101* 110* 180*  BUN 31* 25* CREATININE 1.06 0.95 0.91 0.84 0.86  CALCIUM 7.9* 8.1* 7.8* 7.7* 7.7*  MG  --   --   --   --  2.1    Studies: CT ANGIO CHEST PE W OR WO CONTRAST  Result Date: 02/02/2020 CLINICAL DATA:  High probability pulmonary embolism.  COVID. EXAM: CT ANGIOGRAPHY CHEST WITH CONTRAST TECHNIQUE: Multidetector CT imaging of the chest was performed using the standard protocol during bolus administration of intravenous contrast. Multiplanar CT image reconstructions and MIPs were obtained to evaluate the vascular anatomy. CONTRAST:  90mL OMNIPAQUE IOHEXOL 350 MG/ML SOLN COMPARISON:  02/03/2019 FINDINGS: Cardiovascular: Heart size upper limits normal. Trace pericardial fluid. Satisfactory opacification of pulmonary arteries noted, and there is no evidence of pulmonary emboli. Breathing motion especially through the bases degrades peripheral evaluation. 3-vessel coronary calcifications. Fair contrast opacification of the thoracic aorta. No aneurysm, dissection, or stenosis. Classic 3 vessel brachiocephalic arterial origin anatomy with nonocclusive calcified plaque at the left subclavian artery origin. Scattered calcified plaque in the descending thoracic and proximal abdominal aorta. Mediastinum/Nodes: Large hiatal hernia containing gastric fundus and much of the body. Calcified right paratracheal  lymph node. No new adenopathy. Lungs/Pleura: Trace left pleural effusion. Previously noted small right pleural effusion has resolved. No pneumothorax. Extensive scattered patchy predominantly peripheral airspace and ground-glass opacities most marked in both lower lobes, and the left upper lobe. Upper Abdomen: Moderate hiatal hernia. Punctate calcified granulomas in the spleen. No acute findings. Musculoskeletal: Spondylitic changes in the lower cervical and thoracic spine. Bilateral shoulder DJD. No fracture or worrisome bone lesion. Review of the MIP images confirms the above findings. IMPRESSION: 1. Extensive patchy predominantly peripheral airspace and ground-glass opacities most marked in both lower lobes and the left upper lobe, consistent with COVID pneumonia . 2. Negative for acute PE or thoracic aortic dissection. 3. Trace left pleural effusion. 4. Large hiatal hernia. 5. Coronary and Aortic Atherosclerosis (ICD10-I70.0). Electronically Signed   By: Corlis Leak M.D.   On: 02/02/2020 15:59   ECHOCARDIOGRAM LIMITED BUBBLE STUDY  Result Date: 02/02/2020    ECHOCARDIOGRAM LIMITED REPORT   Patient Name:   QUANTARIUS GENRICH Date of Exam: 02/02/2020 Medical Rec #:  [id number]     Height:  66.0 in Accession #:    [id number]    Weight:       180.0 lb Date of Birth:  1940-06-12     BSA:          1.912 m Patient Age:    79 years      BP:           109/67 mmHg Patient Gender: M             HR:           83 bpm. Exam Location:  Inpatient Procedure: Limited Echo and Saline Contrast Bubble Study Indications:    Dyspnea [[id number]  History:        Patient has prior history of Echocardiogram examinations, most                 recent 01/31/2020. CAD, Arrythmias:Atrial Fibrillation; Risk                 Factors:Diabetes. COVID 19.  Sonographer:    Leta Jungling RDCS Referring Phys: [id number] [facility] M Kailynn Satterly IMPRESSIONS  1. Left ventricular ejection fraction, by estimation, is 60 to 65%. The left ventricle has normal function.  2.  RV McConnell's Sign noted: this can be seen both in the setting of acute lung pathology and in pulmonary embolism. Right ventricular systolic function is low normal.  3. Agitated saline contrast bubble study was negative, with no evidence of any interatrial shunt. Comparison(s): A prior study was performed on 01/31/20. Compared to prior, new presence of RV McConnell's Sign. FINDINGS  Left Ventricle: Left ventricular ejection fraction, by estimation, is 60 to 65%. The left ventricle has normal function. Right Ventricle: RV McConnell's Sign noted: this can be seen both in the setting of acute lung pathology and in pulmonary embolism. Right ventricular systolic function is low normal. IAS/Shunts: Agitated saline contrast was given intravenously to evaluate for intracardiac shunting. Agitated saline contrast bubble study was negative, with no evidence of any interatrial shunt. Riley Lam MD Electronically signed by Riley Lam MD Signature Date/Time: 02/02/2020/3:40:56 PM    Final     Scheduled Meds: . apixaban  5 mg Oral BID  . vitamin C  500 mg Oral Daily  . calcium carbonate  1,250 mg Oral Q breakfast  . calcium carbonate  0.5 tablet Oral Q supper  . doxazosin  1 mg Oral QHS  . folic acid  1 mg Oral Daily  . furosemide  60 mg Oral Daily  . hydroxychloroquine  200 mg Oral BID  . insulin aspart  0-9 Units Subcutaneous TID WC  . losartan  50 mg Oral BID  . methylPREDNISolone (SOLU-MEDROL) injection  80 mg Intravenous Q12H  . pantoprazole  40 mg Oral Daily  . pravastatin  80 mg Oral q1800  . vitamin B-12  1,000 mcg Oral Daily  . zinc sulfate  220 mg Oral Daily   Continuous Infusions: PRN Meds: acetaminophen **OR** acetaminophen, albuterol, butalbital-acetaminophen-caffeine, chlorpheniramine-HYDROcodone, guaiFENesin-dextromethorphan, methocarbamol  Time spent: 35 minutes  Author: Lynden Oxford, MD [facility] 02/02/2020 8:23 PM  To reach On-call, see care teams to locate  the attending and reach out via [email or web address]. Between 7PM-7AM, please contact night-coverage If you still have difficulty reaching the attending provider, please page the [facility] (Director on Call) for [facility] on amion for assistance.

## 2020-02-02 NOTE — Care Management Important Message (Signed)
Important Message  Patient Details  Name: Javier Beard MRN: [id number] Date of Birth: 10-19-1940   Medicare Important Message Given:  Yes - Important Message mailed due to current National Emergency   Verbal consent obtained due to current National Emergency  Relationship to patient: Self Contact Name: Raman Featherston Call Date: 02/02/20  Time: 1245 Phone: [phone] Outcome: Spoke with contact Important Message mailed to: Patient address on file    Orson Aloe 02/02/2020, 12:46 PM

## 2020-02-02 NOTE — Progress Notes (Signed)
  Echocardiogram 2D Echocardiogram limited has been performed.  Leta Jungling M 02/02/2020, 1:22 PM

## 2020-02-02 NOTE — TOC Progression Note (Signed)
Transition of Care [facility]) - Progression Note    Patient Details  Name: Javier Beard MRN: [id number] Date of Birth: 1940-07-31  Transition of Care [facility]) CM/SW Contact  Lockie Pares, RN Phone Number: 02/02/2020, 9:11 AM  Clinical Narrative:    Spoke to patient and daughter Javier Beard today. Patient wants to be discharged as soon as possible, still on higher oxygen flow. Could not tolerate ambulatory qualifications yesterday. Patient just wants to go home and take a good bath. Discussed any DME needs ( none) and  home health. NO particular agency from both, but had [facility] and did not feel that they did not do well. Javier Beard stated that his biggest fear is not being able to walk and mobilize. Javier Beard is patient POA per patient.   Expected Discharge Plan: Home w Home Health Services Barriers to Discharge: Continued Medical Work up  Expected Discharge Plan and Services Expected Discharge Plan: Home w Home Health Services   Discharge Planning Services: CM Consult Post Acute Care Choice: Durable Medical Equipment Living arrangements for the past 2 months: Single Family Home Expected Discharge Date: 02/01/20                                     Social Determinants of Health (SDOH) Interventions    Readmission Risk Interventions No flowsheet data found.

## 2020-02-02 NOTE — Progress Notes (Signed)
Occupational Therapy Evaluation Patient Details Name: Javier Beard MRN: [id number] DOB: 11-13-1940 Today's Date: 02/02/2020    History of Present Illness 80 year old man PMH including rheumatoid arthritis on DMARD, diabetes mellitus type 2, permanent atrial fibrillation, chronic bilateral lower extremity edema presented with increasing shortness of breath, nausea, vomiting and diarrhea.  Symptoms for some time.  Low-grade temp in the emergency department.  Chest x-ray showed bilateral infiltrates, Covid test positive.  Admitted for acute hypoxic respiratory failure secondary to Covid pneumonia.   Clinical Impression   Pt admitted with above. He demonstrates the below listed deficits and will benefit from continued OT to maximize safety and independence with BADLs.  Pt presents to OT with generalized weakness, decreased activity tolerance, impaired balance, impaired cognition. He currently requires set up - total A for ADLs and demonstrates poor activity tolerance.  Sp02 68-87% on 11L HFNC, and he required 11L +NRB to return to mid - high 90s after toilet transfer (see below for details).  He reports he lives alone and was mod I PTA.  I anticipate he will require a SNF stay if family unable to provide 24 hour assist.  Will follow.       Follow Up Recommendations  SNF;Supervision/Assistance - 24 hour (HHOT if 24 hour assist is available)    Equipment Recommendations  None recommended by OT    Recommendations for Other Services       Precautions / Restrictions Precautions Precautions: Fall      Mobility Bed Mobility Overal bed mobility: Needs Assistance Bed Mobility: Supine to Sit     Supine to sit: Min guard;HOB elevated     General bed mobility comments: use of bedrails    Transfers Overall transfer level: Needs assistance Equipment used: Rolling walker (2 wheeled) Transfers: Sit to/from [employer] Sit to Stand: Min assist;Mod assist Stand pivot  transfers: Min assist       General transfer comment: min A from BSC, mod A from bed.  Assist to maneuver RW and assist to control descent when sitting in recliner    Balance Overall balance assessment: Needs assistance Sitting-balance support: Feet supported;Bilateral upper extremity supported Sitting balance-Leahy Scale: Fair     Standing balance support: Bilateral upper extremity supported Standing balance-Leahy Scale: Poor Standing balance comment: requires UE support                           ADL either performed or assessed with clinical judgement   ADL Overall ADL's : Needs assistance/impaired Eating/Feeding: Set up;Bed level;Sitting   Grooming: Wash/dry hands;Wash/dry face;Brushing hair;Set up;Supervision/safety;Sitting   Upper Body Bathing: Moderate assistance;Sitting   Lower Body Bathing: Moderate assistance;Sit to/from stand   Upper Body Dressing : Moderate assistance;Sitting   Lower Body Dressing: Maximal assistance;Sit to/from stand   Toilet Transfer: Moderate assistance;Stand-pivot;BSC;RW Toilet Transfer Details (indicate cue type and reason): assist to move sit to stand from bed and assist to maneuver RW Toileting- Clothing Manipulation and Hygiene: Total assistance;Sit to/from stand       Functional mobility during ADLs: Minimal assistance;Moderate assistance;Rolling walker General ADL Comments: Pt with poor activity tolerance with Sp02 78-87 on 11L HFNC     Vision         Perception     Praxis      Pertinent Vitals/Pain Pain Assessment: No/denies pain     Hand Dominance Right   Extremity/Trunk Assessment Upper Extremity Assessment Upper Extremity Assessment: Generalized weakness;RUE deficits/detail;LUE deficits/detail RUE Deficits / Details:  arthritic deformity noted bil. hands RUE Coordination: decreased fine motor LUE Deficits / Details: arthritic deformity noted bil. hands LUE Coordination: decreased fine motor   Lower  Extremity Assessment Lower Extremity Assessment: Defer to PT evaluation   Cervical / Trunk Assessment Cervical / Trunk Assessment: Kyphotic   Communication Communication Communication: No difficulties   Cognition Arousal/Alertness: Awake/alert Behavior During Therapy: WFL for tasks assessed/performed Overall Cognitive Status: Impaired/Different from baseline Area of Impairment: Attention;Following commands;Safety/judgement;Awareness;Problem solving                   Current Attention Level: Selective   Following Commands: Follows one step commands consistently;Follows multi-step commands inconsistently Safety/Judgement: Decreased awareness of deficits;Decreased awareness of safety Awareness: Intellectual Problem Solving: Difficulty sequencing;Requires verbal cues General Comments: Pt with poor awareness of current deficits and how those impact his function at home.  He is abrasive, but did warm up as session progressed   General Comments  Pt on 3L supplemental 02 at rest with Sp02 91%.  Upon moving Sp02 decreased to low 80s. 02 gradually increased to 11L with pt maintaining Sp02 84-88% (fluctuating).  Upon transfer to recliner, Sp02 decreased to 68% on 11L, and rebounded to 81% once in supported sitting.  He required use of NRB + 11L HFNC to return to mid - high 90s.  At rest Sp02 90% on 9L HFNC - RN notified    Exercises     Shoulder Instructions      Home Living Family/patient expects to be discharged to:: Private residence Living Arrangements: Alone Available Help at Discharge: Family;Available 24 hours/day Type of Home: House Home Access: Ramped entrance     Home Layout: One level     Bathroom Shower/Tub: Producer, television/film/video: Standard Bathroom Accessibility: Yes   Home Equipment: Environmental consultant - 2 wheels;Cane - single point          Prior Functioning/Environment Level of Independence: Independent with assistive device(s)        Comments:  occasionally used RW        OT Problem List: Decreased strength;Decreased activity tolerance;Impaired balance (sitting and/or standing);Decreased coordination;Decreased cognition;Decreased safety awareness;Decreased knowledge of use of DME or AE;Cardiopulmonary status limiting activity;Impaired UE functional use      OT Treatment/Interventions: Self-care/ADL training;Therapeutic exercise;Energy conservation;DME and/or AE instruction;Therapeutic activities;Cognitive remediation/compensation;Patient/family education;Balance training    OT Goals(Current goals can be found in the care plan section) Acute Rehab OT Goals Patient Stated Goal: to go home and shower OT Goal Formulation: With patient Time For Goal Achievement: 02/16/20 Potential to Achieve Goals: Good ADL Goals Pt Will Perform Grooming: with min assist;standing Pt Will Perform Upper Body Bathing: sitting;with min assist Pt Will Perform Lower Body Bathing: with min assist;sit to/from stand Pt Will Transfer to Toilet: with min guard assist;ambulating;regular height toilet;bedside commode;grab bars Pt Will Perform Toileting - Clothing Manipulation and hygiene: with min guard assist;sit to/from stand Pt/caregiver will Perform Home Exercise Program: Increased strength;Both right and left upper extremity;With theraband;With Supervision;With written HEP provided Additional ADL Goal #1: Pt will actively participate in 20 mins therapeutic activity with no more than 2 rest breaks and Sp02 >88%  OT Frequency: Min 3X/week   Barriers to D/C: Decreased caregiver support  unsure that pt will have necessary level of assist at discharge as he reports he lives alone and daughter works       Co-evaluation              AM-PAC OT "6 Clicks" Daily Activity  Outcome Measure Help from another person eating meals?: None Help from another person taking care of personal grooming?: A Little Help from another person toileting, which includes  using toliet, bedpan, or urinal?: A Lot Help from another person bathing (including washing, rinsing, drying)?: A Lot Help from another person to put on and taking off regular upper body clothing?: A Lot Help from another person to put on and taking off regular lower body clothing?: Total 6 Click Score: 14   End of Session Equipment Utilized During Treatment: Oxygen;Rolling walker Nurse Communication: Mobility status  Activity Tolerance: Patient limited by fatigue Patient left: in chair;with call bell/phone within reach  OT Visit Diagnosis: Unsteadiness on feet (R26.81)                Time: 1610-9604 OT Time Calculation (min): 50 min Charges:  OT General Charges $OT Visit: 1 Visit OT Evaluation $OT Eval Moderate Complexity: 1 Mod OT Treatments $Self Care/Home Management : 23-37 mins  Eber Jones., OTR/L Acute Rehabilitation Services Pager [phone] Office [phone]   Jeani Hawking M 02/02/2020, 12:27 PM

## 2020-02-03 DIAGNOSIS — U071 COVID-19: Secondary | ICD-10-CM | POA: Diagnosis not present

## 2020-02-03 DIAGNOSIS — J9601 Acute respiratory failure with hypoxia: Secondary | ICD-10-CM | POA: Diagnosis not present

## 2020-02-03 LAB — GLUCOSE, CAPILLARY
Glucose-Capillary: 196 mg/dL — ABNORMAL HIGH (ref 70–99)
Glucose-Capillary: 278 mg/dL — ABNORMAL HIGH (ref 70–99)
Glucose-Capillary: 336 mg/dL — ABNORMAL HIGH (ref 70–99)
Glucose-Capillary: 254 mg/dL — ABNORMAL HIGH (ref 70–99)

## 2020-02-03 LAB — BASIC METABOLIC PANEL
Anion gap: 11 (ref 5–15)
BUN: 22 mg/dL (ref 8–23)
CO2: 20 mmol/L — ABNORMAL LOW (ref 22–32)
Calcium: 8 mg/dL — ABNORMAL LOW (ref 8.9–10.3)
Chloride: 101 mmol/L (ref 98–111)
Creatinine, Ser: 0.97 mg/dL (ref 0.61–1.24)
GFR, Estimated: >60 mL/min (ref >60)
Glucose, Bld: 229 mg/dL — ABNORMAL HIGH (ref 70–99)
Potassium: 4 mmol/L (ref 3.5–5.1)
Sodium: 132 mmol/L — ABNORMAL LOW (ref 135–145)

## 2020-02-03 LAB — C-REACTIVE PROTEIN: CRP: 7.7 mg/dL — ABNORMAL HIGH (ref <1.0)

## 2020-02-03 NOTE — Progress Notes (Signed)
SATURATION QUALIFICATIONS: (This note is used to comply with regulatory documentation for home oxygen)  Patient Saturations on Room Air at Rest = 90%  Patient Saturations on Room Air while Ambulating = 76%  Patient Saturations on 15 Liters of oxygen while Ambulating = 88%  Please briefly explain why patient needs home oxygen:

## 2020-02-03 NOTE — Progress Notes (Signed)
[facility] Progress Note  Patient: Javier Beard    AOZ:[id number]  DOA: 2020-01-30     Date of Service: the patient was seen and examined on 02/03/2020  Brief hospital course: Possible due to history of rheumatoid arthritis, type II DM, A. fib, chronic HFpEF.  Presents with complaints of cough and shortness of breath.  Found to have COVID-19 pneumonia with hypoxia Currently plan is supportive care.  Assessment and Plan: 1. Acute Hypoxic respiratory failure, POA, 92 % on room air on admission.  Acute COVID-19 Viral Pneumonia CXR: hazy bilateral peripheral opacities CTA chest PE protocol 1/14; negative for PE Oxygen requirement: At rest 2 LPM, on exertion 15 LPM 88% sats CRP: From 17.6 dropped down to 3.2, peaked again at 16.3 now 7.7. Remdesivir: Completed on 1/11 Steroids: Back on IV Solu-Medrol due to elevated CRP and increasing oxygen demand Baricitinib/Actemra: Not indicated, will discuss with patient if no improvement tomorrow. Antibiotics: Not indicated DVT Prophylaxis:  apixaban (ELIQUIS) tablet 5 mg  Prone positioning and incentive spirometer use recommended.  Overall plan: Continue with IV steroids.  Monitor for improvement in oxygenation  The treatment plan and use of medications and known side effects were discussed with patient/family. It was clearly explained that complete risks and long-term side effects are unknown. Patient/family agree with the treatment plan.    2.  Rheumatoid arthritis Continue Plaquenil. Monitor.  Holding methotrexate.  3.  Hyponatremia Corrected. Monitor   4.  Type 2 diabetes mellitus uncontrolled with hyperglycemia and hypoglycemia Currently on sliding scale insulin prednisone discontinue glipizide.  5.  Permanent A. fib On anticoagulation with Eliquis.  Rate controlled.  6.  Chronic HFpEF Orthodeoxia Monitor. Echocardiogram negative for any acute abnormality per Echocardiogram with bubble study also negative for any  intra-chamber shunt.  Diet: Carb modified diet DVT Prophylaxis:    apixaban (ELIQUIS) tablet 5 mg    Advance goals of care discussion: Full code  Family Communication: no family was present at bedside, at the time of interview.  Discussed with daughter on the phone.  Disposition:  Status is: Inpatient  Remains inpatient appropriate because:Inpatient level of care appropriate due to severity of illness  Dispo: The patient is from: Home              Anticipated d/c is to: Home              Anticipated d/c date is: 2 days              Patient currently is not medically stable to d/c.  Subjective: No nausea or vomiting.  Tells me that he is actually able to breathe better today.  No chest pain.  Physical Exam:  General: Appear in mild distress, no Rash; Oral Mucosa Clear, moist. no Abnormal Neck Mass Or lumps, Conjunctiva normal  Cardiovascular: S1 and S2 Present, no Murmur, Respiratory: increased respiratory effort, Bilateral Air entry present and bilateral  Crackles, no wheezes Abdomen: Bowel Sound present, Soft and no tenderness Extremities: trace Pedal edema Neurology: alert and oriented to time, place, and person affect appropriate. no new focal deficit Gait not checked due to patient safety concerns  Vitals:   02/03/20 0410 02/03/20 0803 02/03/20 1301 02/03/20 1313  BP: 130/65 122/82 (!) 99/52 (!) 98/57  Pulse: 76 77 73 88  Resp: 20 (!) 22 (!) 22 20  Temp: 97.9 F (36.6 C) (!) 97.4 F (36.3 C) 98 F (36.7 C) 97.9 F (36.6 C)  TempSrc: Oral Oral Oral Oral  SpO2: 92%  90% 91% 90%  Weight: 72.6 kg     Height:        Intake/Output Summary (Last 24 hours) at 02/03/2020 1512 Last data filed at 02/03/2020 0933 Gross per 24 hour  Intake 480 ml  Output 750 ml  Net -270 ml   Filed Weights   2020-02-12 1316 02/03/20 0410  Weight: 81.6 kg 72.6 kg    Data Reviewed: I have personally reviewed and interpreted daily labs, tele strips, imaging. I reviewed all nursing  notes, pharmacy notes, vitals, pertinent old records I have discussed plan of care as described above with RN and patient/family.  CBC: Recent Labs  Lab 01/29/20 0108 01/30/20 1052 01/31/20 0107 02/01/20 0225 02/02/20 0815  WBC 8.0 10.9* 8.4 7.8 9.7  NEUTROABS 7.2 9.5* 7.2 6.4 8.2*  HGB 10.6* 11.1* 10.6* 10.8* 11.3*  HCT 31.4* 33.1* 30.1* 30.5* 32.1*  MCV 92.6 92.2 92.6 90.0 91.7  PLT 229 266 265 257 277   Basic Metabolic Panel: Recent Labs  Lab 01/30/20 1052 01/31/20 0107 02/01/20 0225 02/02/20 0815 02/03/20 0912  NA 134* 135 134* 129* 132*  K 3.4* 3.8 3.3* 3.8 4.0  CL 106 106 103 101 101  CO2 19* 20* 20* 16* 20*  GLUCOSE 109* 101* 110* 180* 229*  BUN 25* 22 19 19 22   CREATININE 0.95 0.91 0.84 0.86 0.97  CALCIUM 8.1* 7.8* 7.7* 7.7* 8.0*  MG  --   --   --  2.1  --     Studies: CT ANGIO CHEST PE W OR WO CONTRAST  Result Date: 02/02/2020 CLINICAL DATA:  High probability pulmonary embolism.  COVID. EXAM: CT ANGIOGRAPHY CHEST WITH CONTRAST TECHNIQUE: Multidetector CT imaging of the chest was performed using the standard protocol during bolus administration of intravenous contrast. Multiplanar CT image reconstructions and MIPs were obtained to evaluate the vascular anatomy. CONTRAST:  47mL OMNIPAQUE IOHEXOL 350 MG/ML SOLN COMPARISON:  02/03/2019 FINDINGS: Cardiovascular: Heart size upper limits normal. Trace pericardial fluid. Satisfactory opacification of pulmonary arteries noted, and there is no evidence of pulmonary emboli. Breathing motion especially through the bases degrades peripheral evaluation. 3-vessel coronary calcifications. Fair contrast opacification of the thoracic aorta. No aneurysm, dissection, or stenosis. Classic 3 vessel brachiocephalic arterial origin anatomy with nonocclusive calcified plaque at the left subclavian artery origin. Scattered calcified plaque in the descending thoracic and proximal abdominal aorta. Mediastinum/Nodes: Large hiatal hernia  containing gastric fundus and much of the body. Calcified right paratracheal lymph node. No new adenopathy. Lungs/Pleura: Trace left pleural effusion. Previously noted small right pleural effusion has resolved. No pneumothorax. Extensive scattered patchy predominantly peripheral airspace and ground-glass opacities most marked in both lower lobes, and the left upper lobe. Upper Abdomen: Moderate hiatal hernia. Punctate calcified granulomas in the spleen. No acute findings. Musculoskeletal: Spondylitic changes in the lower cervical and thoracic spine. Bilateral shoulder DJD. No fracture or worrisome bone lesion. Review of the MIP images confirms the above findings. IMPRESSION: 1. Extensive patchy predominantly peripheral airspace and ground-glass opacities most marked in both lower lobes and the left upper lobe, consistent with COVID pneumonia . 2. Negative for acute PE or thoracic aortic dissection. 3. Trace left pleural effusion. 4. Large hiatal hernia. 5. Coronary and Aortic Atherosclerosis (ICD10-I70.0). Electronically Signed   By: 02/05/2019 M.D.   On: 02/02/2020 15:59    Scheduled Meds: . apixaban  5 mg Oral BID  . vitamin C  500 mg Oral Daily  . calcium carbonate  1,250 mg Oral Q breakfast  . calcium  carbonate  0.5 tablet Oral Q supper  . doxazosin  1 mg Oral QHS  . folic acid  1 mg Oral Daily  . furosemide  60 mg Oral Daily  . hydroxychloroquine  200 mg Oral BID  . insulin aspart  0-9 Units Subcutaneous TID WC  . losartan  50 mg Oral BID  . methylPREDNISolone (SOLU-MEDROL) injection  80 mg Intravenous Q12H  . pantoprazole  40 mg Oral Daily  . pravastatin  80 mg Oral q1800  . vitamin B-12  1,000 mcg Oral Daily  . zinc sulfate  220 mg Oral Daily   Continuous Infusions: PRN Meds: acetaminophen **OR** acetaminophen, albuterol, butalbital-acetaminophen-caffeine, chlorpheniramine-HYDROcodone, guaiFENesin-dextromethorphan, methocarbamol  Time spent: 35 minutes  Author: Lynden Oxford, MD [facility] 02/03/2020 3:12 PM  To reach On-call, see care teams to locate the attending and reach out via [email or web address]. Between 7PM-7AM, please contact night-coverage If you still have difficulty reaching the attending provider, please page the [facility] (Director on Call) for [facility] on amion for assistance.

## 2020-02-04 DIAGNOSIS — U071 COVID-19: Secondary | ICD-10-CM | POA: Diagnosis not present

## 2020-02-04 DIAGNOSIS — J9601 Acute respiratory failure with hypoxia: Secondary | ICD-10-CM | POA: Diagnosis not present

## 2020-02-04 LAB — BASIC METABOLIC PANEL
Anion gap: 9 (ref 5–15)
BUN: 29 mg/dL — ABNORMAL HIGH (ref 8–23)
CO2: 20 mmol/L — ABNORMAL LOW (ref 22–32)
Calcium: 7.8 mg/dL — ABNORMAL LOW (ref 8.9–10.3)
Chloride: 102 mmol/L (ref 98–111)
Creatinine, Ser: 1.01 mg/dL (ref 0.61–1.24)
GFR, Estimated: >60 mL/min (ref >60)
Glucose, Bld: 278 mg/dL — ABNORMAL HIGH (ref 70–99)
Potassium: 4.2 mmol/L (ref 3.5–5.1)
Sodium: 131 mmol/L — ABNORMAL LOW (ref 135–145)

## 2020-02-04 LAB — GLUCOSE, CAPILLARY
Glucose-Capillary: 210 mg/dL — ABNORMAL HIGH (ref 70–99)
Glucose-Capillary: 299 mg/dL — ABNORMAL HIGH (ref 70–99)
Glucose-Capillary: 295 mg/dL — ABNORMAL HIGH (ref 70–99)
Glucose-Capillary: 207 mg/dL — ABNORMAL HIGH (ref 70–99)

## 2020-02-04 LAB — C-REACTIVE PROTEIN: CRP: 4.4 mg/dL — ABNORMAL HIGH (ref <1.0)

## 2020-02-04 MED ORDER — FUROSEMIDE 10 MG/ML IJ SOLN
20.0000 mg | Freq: Once | INTRAMUSCULAR | Status: AC
  Administered 2020-02-04: 20 mg via INTRAVENOUS
  Filled 2020-02-04: qty 2

## 2020-02-04 MED ORDER — FUROSEMIDE 10 MG/ML IJ SOLN: 40.0000 mg | Freq: Every day | INTRAMUSCULAR | Status: DC

## 2020-02-04 NOTE — Progress Notes (Signed)
[facility] Progress Note  Patient: Javier Beard    WUJ:[id number]  DOA: 01/25/2020     Date of Service: the patient was seen and examined on 02/04/2020  Brief hospital course: Possible due to history of rheumatoid arthritis, type II DM, A. fib, chronic HFpEF.  Presents with complaints of cough and shortness of breath.  Found to have COVID-19 pneumonia with hypoxia Currently plan is supportive care.  Assessment and Plan: 1. Acute Hypoxic respiratory failure, POA, 92 % on room air on admission.  Acute COVID-19 Viral Pneumonia CXR: hazy bilateral peripheral opacities CTA chest PE protocol 1/14; negative for PE Oxygen requirement: At rest 2 LPM, on exertion 10 LPM 88% sats CRP: From 17.6 dropped down to 3.2, peaked again at 16.3 now 4.4. Remdesivir: Completed on 1/11 Steroids: Back on IV Solu-Medrol due to elevated CRP and increasing oxygen demand Baricitinib/Actemra: Not indicated, will discuss with patient if no improvement tomorrow. Antibiotics: Not indicated DVT Prophylaxis:  apixaban (ELIQUIS) tablet 5 mg  Prone positioning and incentive spirometer use recommended.  Overall plan: Continue with IV steroids.  Monitor for improvement in oxygenation, will initiate diuresis switching from oral Lasix to IV Lasix. Monitor  The treatment plan and use of medications and known side effects were discussed with patient/family. It was clearly explained that complete risks and long-term side effects are unknown. Patient/family agree with the treatment plan.    2.  Rheumatoid arthritis Continue Plaquenil. Monitor.  Holding methotrexate.  3.  Hyponatremia Corrected. Monitor   4.  Type 2 diabetes mellitus uncontrolled with hyperglycemia and hypoglycemia Currently on sliding scale insulin prednisone discontinue glipizide.  5.  Permanent A. fib On anticoagulation with Eliquis.  Rate controlled.  6.  Chronic HFpEF Orthodeoxia Monitor. Patient is on oral Lasix Will attempt diuresis to  see if that improves her hypoxia.  Echocardiogram negative for any acute abnormality per Echocardiogram with bubble study also negative for any intra-chamber shunt.  Diet: Carb modified diet DVT Prophylaxis:    apixaban (ELIQUIS) tablet 5 mg    Advance goals of care discussion: Full code  Family Communication: no family was present at bedside, at the time of interview.   Disposition:  Status is: Inpatient  Remains inpatient appropriate because:Inpatient level of care appropriate due to severity of illness  Dispo: The patient is from: Home              Anticipated d/c is to: Home              Anticipated d/c date is: 2 days              Patient currently is not medically stable to d/c.  Subjective: Discussed with that he is more short of breath. No nausea no vomiting or diabetes mellitus. He is still on room air. Improvement on exertional dyspnea and hypoxia.  Physical Exam:  General: Appear in mild distress, no Rash; Oral Mucosa Clear, moist. no Abnormal Neck Mass Or lumps, Conjunctiva normal  Cardiovascular: S1 and S2 Present, no Murmur, Respiratory: increased respiratory effort, Bilateral Air entry present and bilateral  Crackles, no wheezes Abdomen: Bowel Sound present, Soft and no tenderness Extremities: trace Pedal edema Neurology: alert and oriented to time, place, and person affect appropriate. no new focal deficit Gait not checked due to patient safety concerns    Vitals:   02/04/20 0520 02/04/20 0723 02/04/20 1213 02/04/20 1233  BP: 129/71 132/73 105/60 114/69  Pulse:  78 100 72  Resp: 20 (!) 25 (!) 21 (!)  25  Temp: (!) 97.1 F (36.2 C) (!) 97.5 F (36.4 C) 97.7 F (36.5 C) 97.8 F (36.6 C)  TempSrc: Oral Oral Oral Oral  SpO2: 94% 92% 90% 95%  Weight:      Height:        Intake/Output Summary (Last 24 hours) at 02/04/2020 1414 Last data filed at 02/04/2020 1214 Gross per 24 hour  Intake 500 ml  Output 1125 ml  Net -625 ml   Filed Weights   14-Feb-2020  1316 02/03/20 0410  Weight: 81.6 kg 72.6 kg    Data Reviewed: I have personally reviewed and interpreted daily labs, tele strips, imaging. I reviewed all nursing notes, pharmacy notes, vitals, pertinent old records I have discussed plan of care as described above with RN and patient/family.  CBC: Recent Labs  Lab 01/29/20 0108 01/30/20 1052 01/31/20 0107 02/01/20 0225 02/02/20 0815  WBC 8.0 10.9* 8.4 7.8 9.7  NEUTROABS 7.2 9.5* 7.2 6.4 8.2*  HGB 10.6* 11.1* 10.6* 10.8* 11.3*  HCT 31.4* 33.1* 30.1* 30.5* 32.1*  MCV 92.6 92.2 92.6 90.0 91.7  PLT 229 266 265 257 277   Basic Metabolic Panel: Recent Labs  Lab 01/31/20 0107 02/01/20 0225 02/02/20 0815 02/03/20 0912 02/04/20 0206  NA 135 134* 129* 132* 131*  K 3.8 3.3* 3.8 4.0 4.2  CL 106 103 101 101 102  CO2 20* 20* 16* 20* 20*  GLUCOSE 101* 110* 180* 229* 278*  BUN 22 19 19 22  29*  CREATININE 0.91 0.84 0.86 0.97 1.01  CALCIUM 7.8* 7.7* 7.7* 8.0* 7.8*  MG  --   --  2.1  --   --     Studies: No results found.  Scheduled Meds: . apixaban  5 mg Oral BID  . vitamin C  500 mg Oral Daily  . calcium carbonate  1,250 mg Oral Q breakfast  . calcium carbonate  0.5 tablet Oral Q supper  . doxazosin  1 mg Oral QHS  . folic acid  1 mg Oral Daily  . [START ON 02/05/2020] furosemide  40 mg Intravenous Daily  . hydroxychloroquine  200 mg Oral BID  . insulin aspart  0-9 Units Subcutaneous TID WC  . methylPREDNISolone (SOLU-MEDROL) injection  80 mg Intravenous Q12H  . pantoprazole  40 mg Oral Daily  . pravastatin  80 mg Oral q1800  . vitamin B-12  1,000 mcg Oral Daily  . zinc sulfate  220 mg Oral Daily   Continuous Infusions: PRN Meds: acetaminophen **OR** acetaminophen, albuterol, butalbital-acetaminophen-caffeine, chlorpheniramine-HYDROcodone, guaiFENesin-dextromethorphan, methocarbamol  Time spent: 35 minutes  Author: 02/07/2020, MD [facility] 02/04/2020 2:14 PM  To reach On-call, see care teams to locate the  attending and reach out via [email or web address]. Between 7PM-7AM, please contact night-coverage If you still have difficulty reaching the attending provider, please page the [facility] (Director on Call) for [facility] on amion for assistance.

## 2020-02-04 NOTE — Progress Notes (Signed)
SATURATION QUALIFICATIONS: (This note is used to comply with regulatory documentation for home oxygen)  Patient Saturations on Room Air at Rest = 95%  Patient Saturations on Room Air while Ambulating = 79%  Patient Saturations on 10 Liters of oxygen while Ambulating = 88%  Please briefly explain why patient needs home oxygen:

## 2020-02-05 DIAGNOSIS — J9601 Acute respiratory failure with hypoxia: Secondary | ICD-10-CM | POA: Diagnosis not present

## 2020-02-05 DIAGNOSIS — U071 COVID-19: Secondary | ICD-10-CM | POA: Diagnosis not present

## 2020-02-05 LAB — BASIC METABOLIC PANEL
Anion gap: 9 (ref 5–15)
BUN: 24 mg/dL — ABNORMAL HIGH (ref 8–23)
CO2: 20 mmol/L — ABNORMAL LOW (ref 22–32)
Calcium: 7.4 mg/dL — ABNORMAL LOW (ref 8.9–10.3)
Chloride: 99 mmol/L (ref 98–111)
Creatinine, Ser: 0.98 mg/dL (ref 0.61–1.24)
GFR, Estimated: >60 mL/min (ref >60)
Glucose, Bld: 256 mg/dL — ABNORMAL HIGH (ref 70–99)
Potassium: 4.2 mmol/L (ref 3.5–5.1)
Sodium: 128 mmol/L — ABNORMAL LOW (ref 135–145)

## 2020-02-05 LAB — GLUCOSE, CAPILLARY
Glucose-Capillary: 246 mg/dL — ABNORMAL HIGH (ref 70–99)
Glucose-Capillary: 263 mg/dL — ABNORMAL HIGH (ref 70–99)
Glucose-Capillary: 268 mg/dL — ABNORMAL HIGH (ref 70–99)
Glucose-Capillary: 182 mg/dL — ABNORMAL HIGH (ref 70–99)

## 2020-02-05 LAB — C-REACTIVE PROTEIN: CRP: 2.7 mg/dL — ABNORMAL HIGH (ref <1.0)

## 2020-02-05 MED ORDER — SALINE SPRAY 0.65 % NA SOLN
1.0000 | NASAL | Status: DC | PRN
  Administered 2020-02-05: 1 via NASAL
  Filled 2020-02-05: qty 44

## 2020-02-05 MED ORDER — INSULIN ASPART 100 UNIT/ML ~~LOC~~ SOLN
3.0000 [IU] | Freq: Three times a day (TID) | SUBCUTANEOUS | Status: DC
  Administered 2020-02-06 – 2020-02-08 (×9): 3 [IU] via SUBCUTANEOUS

## 2020-02-05 MED ORDER — INSULIN ASPART 100 UNIT/ML ~~LOC~~ SOLN
0.0000 [IU] | Freq: Every day | SUBCUTANEOUS | Status: DC
  Administered 2020-02-07: 2 [IU] via SUBCUTANEOUS

## 2020-02-05 MED ORDER — INSULIN ASPART 100 UNIT/ML ~~LOC~~ SOLN
0.0000 [IU] | Freq: Three times a day (TID) | SUBCUTANEOUS | Status: DC
  Administered 2020-02-05: 11 [IU] via SUBCUTANEOUS
  Administered 2020-02-06 (×3): 7 [IU] via SUBCUTANEOUS
  Administered 2020-02-07: 4 [IU] via SUBCUTANEOUS
  Administered 2020-02-07 – 2020-02-09 (×5): 7 [IU] via SUBCUTANEOUS
  Administered 2020-02-09 – 2020-02-10 (×2): 3 [IU] via SUBCUTANEOUS
  Administered 2020-02-10 – 2020-02-12 (×3): 4 [IU] via SUBCUTANEOUS
  Administered 2020-02-13: 3 [IU] via SUBCUTANEOUS

## 2020-02-05 NOTE — Progress Notes (Signed)
Results for ZIAN, DELAIR (MRN [id number]) as of 02/05/2020 11:09  Ref. Range 02/04/2020 07:26 02/04/2020 12:35 02/04/2020 16:56 02/04/2020 20:58 02/05/2020 07:37  Glucose-Capillary Latest Ref Range: 70 - 99 mg/dL 497 (H) 026 (H) 378 (H) 207 (H) 246 (H)  Noted that blood sugars continue to be greater than 180 mg/dl.  Recommend increasing Novolog to MODERATE correction scale TID. If blood sugars still continue to be elevated, recommend adding Novolog 4 units TID with meals if eating at least 50% of meal and while on steroids.   Smith Mince RN BSN CDE Diabetes Coordinator Pager: [phone]  8am-5pm

## 2020-02-05 NOTE — Progress Notes (Signed)
Physical Therapy Treatment Patient Details Name: Javier Beard MRN: [id number] DOB: 01/08/1941 Today's Date: 02/05/2020    History of Present Illness Pt is a 80 y.o. male admitted 01/23/2020 with worsening SOB, nausea/vomitting. Workup for acute hypoxic respiratory failure due to COVID-19 PNA. CTA 1/14 negative for PE. PMH includes RA, DM2, afib, HF.   PT Comments     Pt slowly progressing with mobility. Able to tolerate very short bouts of standing activity with minA, SpO2 down to 76% on 15L O2 HFNC + 15L NRB with DOE 4/4. Pt requires prolonged seated rest to recover desaturation and SOB. SpO2 maintaining >/88% on 15L HHFNC at end of session without NRB. Suspect pt will likely require SNF-level therapies to maximize functional mobility and independence prior to return home. If pt declines, will need initial 24/7 assist and [facility] services.   Follow Up Recommendations  SNF;Supervision/Assistance - 24 hour (pending progress)     Equipment Recommendations  Home O2    Recommendations for Other Services       Precautions / Restrictions Precautions Precautions: Fall;Other (comment) Precaution Comments: Watch SpO2 - quick to desaturate on 15L HFNC + 15L NRB Restrictions Weight Bearing Restrictions: No    Mobility  Bed Mobility               General bed mobility comments: Received sitting on BSC  Transfers Overall transfer level: Needs assistance Equipment used: 1 person hand held assist Transfers: Sit to/from Stand           General transfer comment: Pt declined use of RW; reliant on minA for UE support to maintain stability standing from [facility] and multiple stands from recliner  Ambulation/Gait Ambulation/Gait assistance: Min guard Gait Distance (Feet): 4 Feet Assistive device: None Gait Pattern/deviations: Step-to pattern;Shuffle;Trunk flexed Gait velocity: Decreased   General Gait Details: Pt using windowsill as UE support to maintain balnce with steps forwards/backwards  in front of recliner within confines of O2 lines; distance limited by fatigue, DOE and hypoxia; SpO2 down to 78% on 15L O2 HFNC + 15L NRB   Stairs             Wheelchair Mobility    Modified Rankin (Stroke Patients Only)       Balance Overall balance assessment: Needs assistance Sitting-balance support: Feet supported;Bilateral upper extremity supported Sitting balance-Leahy Scale: Fair     Standing balance support: Single extremity supported Standing balance-Leahy Scale: Poor Standing balance comment: Reliant on UE support to maintain standing balance                            Cognition Arousal/Alertness: Awake/alert Behavior During Therapy: Flat affect Overall Cognitive Status: No family/caregiver present to determine baseline cognitive functioning Area of Impairment: Attention;Following commands;Safety/judgement;Awareness;Problem solving                   Current Attention Level: Selective   Following Commands: Follows one step commands consistently Safety/Judgement: Decreased awareness of deficits;Decreased awareness of safety Awareness: Intellectual Problem Solving: Requires verbal cues General Comments: Improved desire to participate, still limited by significant SOB/fatigue/hypoxia with minimal activity, therefore did not do much talking. Still with poor insight into deficits and ability to d/c home      Exercises      General Comments General comments (skin integrity, edema, etc.): Pt reports, "this thing isn't doing anything" (regarding the NRB mask) - explained reason for NRB and mask adjusted to fit pt's face better with saturations  improving. MD present to observe SpO2 down to 78% and again to 80% with various standing activity trials on 15L O2 HFNC + 15L NRB. SpO2 up to 93% by end of sessions; NRB removed and pt maintaining 88% on 15L O2 HFNC      Pertinent Vitals/Pain Pain Assessment: Faces Faces Pain Scale: Hurts a little  bit Pain Location: sacral area uncomfortable in recliner - fixed with sitting on pillow Pain Descriptors / Indicators: Sore Pain Intervention(s): Repositioned    Home Living                      Prior Function            PT Goals (current goals can now be found in the care plan section) Progress towards PT goals: Progressing toward goals    Frequency    Min 3X/week      PT Plan Current plan remains appropriate    Co-evaluation              AM-PAC PT "6 Clicks" Mobility   Outcome Measure  Help needed turning from your back to your side while in a flat bed without using bedrails?: A Little Help needed moving from lying on your back to sitting on the side of a flat bed without using bedrails?: A Little Help needed moving to and from a bed to a chair (including a wheelchair)?: A Little Help needed standing up from a chair using your arms (e.g., wheelchair or bedside chair)?: A Little Help needed to walk in hospital room?: A Lot Help needed climbing 3-5 steps with a railing? : A Lot 6 Click Score: 16    End of Session Equipment Utilized During Treatment: Oxygen Activity Tolerance: Patient limited by fatigue;Treatment limited secondary to medical complications (Comment) (hypoxia/SOB) Patient left: in chair;with call bell/phone within reach Nurse Communication: Mobility status PT Visit Diagnosis: Unsteadiness on feet (R26.81);Other abnormalities of gait and mobility (R26.89)     Time: 9244-6286 PT Time Calculation (min) (ACUTE ONLY): 20 min  Charges:  $Therapeutic Activity: 8-22 mins                     Ina Homes, PT, DPT Acute Rehabilitation Services  Pager [phone] Office [phone]  Malachy Chamber 02/05/2020, 12:48 PM

## 2020-02-05 NOTE — Progress Notes (Signed)
[facility] Progress Note  Patient: Javier Beard    KNL:[id number]  DOA: Feb 19, 2020     Date of Service: the patient was seen and examined on 02/05/2020  Brief hospital course: Possible due to history of rheumatoid arthritis, type II DM, A. fib, chronic HFpEF.  Presents with complaints of cough and shortness of breath.  Found to have COVID-19 pneumonia with hypoxia Currently plan is supportive care.  Assessment and Plan: 1. Acute Hypoxic respiratory failure, POA, 92 % on room air on admission.  Acute COVID-19 Viral Pneumonia CXR: hazy bilateral peripheral opacities CTA chest PE protocol 1/14; negative for PE Oxygen requirement: At rest 2 LPM, on exertion needing nonrebreather after activity with physical therapy. CRP: From 17.6 dropped down to 3.2, peaked again at 16.3 now 2.7. Remdesivir: Completed on 1/11 Steroids: Back on IV Solu-Medrol due to elevated CRP and increasing oxygen demand Baricitinib/Actemra: Not indicated, will discuss with patient if no improvement tomorrow. Antibiotics: Not indicated DVT Prophylaxis:  apixaban (ELIQUIS) tablet 5 mg  Prone positioning and incentive spirometer use recommended.  Overall plan: Continue with IV steroids.  Monitor for improvement in oxygenation, will continue diuresis switching from oral Lasix to IV Lasix. Monitor  The treatment plan and use of medications and known side effects were discussed with patient/family. It was clearly explained that complete risks and long-term side effects are unknown. Patient/family agree with the treatment plan.    2.  Rheumatoid arthritis Continue Plaquenil. Monitor.  Holding methotrexate.  3.  Hyponatremia Corrected. Monitor   4.  Type 2 diabetes mellitus uncontrolled with hyperglycemia and hypoglycemia Currently on sliding scale insulin prednisone discontinue glipizide.  5.  Permanent A. fib On anticoagulation with Eliquis.  Rate controlled.  6.  Chronic  HFpEF Orthodeoxia Monitor. Continue with IV Lasix. Echocardiogram negative for any acute abnormality per Echocardiogram with bubble study also negative for any intra-chamber shunt.  Diet: Carb modified diet DVT Prophylaxis:    apixaban (ELIQUIS) tablet 5 mg    Advance goals of care discussion: Full code  Family Communication: no family was present at bedside, at the time of interview.  Discussed with daughter on 1/16  Disposition:  Status is: Inpatient  Remains inpatient appropriate because:Inpatient level of care appropriate due to severity of illness  Dispo: The patient is from: Home              Anticipated d/c is to: Home              Anticipated d/c date is: 2 days              Patient currently is not medically stable to d/c.  Subjective: No nausea no vomiting no fever no chills.  Continues to have fatigue Shortness of breath Seen after working with physical therapy patient was sitting in the chair on nasal cannula as well as NRB in respiratory distress.  Physical Exam:  General: Appear in moderate distress, no Rash; Oral Mucosa Clear, moist. no Abnormal Neck Mass Or lumps, Conjunctiva normal  Cardiovascular: S1 and S2 Present, no Murmur, Respiratory: increased respiratory effort, Bilateral Air entry present and bilateral  Crackles, no wheezes Abdomen: Bowel Sound present, Soft and no tenderness Extremities: trace Pedal edema Neurology: alert and oriented to time, place, and person affect appropriate. no new focal deficit Gait not checked due to patient safety concerns    Vitals:   02/05/20 1153 02/05/20 1541 02/05/20 1827 02/05/20 2043  BP: 115/65 104/64  120/62  Pulse: 83 80  76  Resp: 19 20  20   Temp: 98.1 F (36.7 C) 98 F (36.7 C)  97.7 F (36.5 C)  TempSrc: Oral Oral  Oral  SpO2: 93% 97% (!) 88% 93%  Weight:      Height:        Intake/Output Summary (Last 24 hours) at 02/05/2020 2131 Last data filed at 02/05/2020 1900 Gross per 24 hour  Intake 720  ml  Output 900 ml  Net -180 ml   Filed Weights   February 11, 2020 1316 02/03/20 0410  Weight: 81.6 kg 72.6 kg    Data Reviewed: I have personally reviewed and interpreted daily labs, tele strips, imaging. I reviewed all nursing notes, pharmacy notes, vitals, pertinent old records I have discussed plan of care as described above with RN and patient/family.  CBC: Recent Labs  Lab 01/30/20 1052 01/31/20 0107 02/01/20 0225 02/02/20 0815  WBC 10.9* 8.4 7.8 9.7  NEUTROABS 9.5* 7.2 6.4 8.2*  HGB 11.1* 10.6* 10.8* 11.3*  HCT 33.1* 30.1* 30.5* 32.1*  MCV 92.2 92.6 90.0 91.7  PLT 266 265 257 277   Basic Metabolic Panel: Recent Labs  Lab 02/01/20 0225 02/02/20 0815 02/03/20 0912 02/04/20 0206 02/05/20 0143  NA 134* 129* 132* 131* 128*  K 3.3* 3.8 4.0 4.2 4.2  CL 103 101 101 102 99  CO2 20* 16* 20* 20* 20*  GLUCOSE 110* 180* 229* 278* 256*  BUN 19 19 22  29* 24*  CREATININE 0.84 0.86 0.97 1.01 0.98  CALCIUM 7.7* 7.7* 8.0* 7.8* 7.4*  MG  --  2.1  --   --   --     Studies: No results found.  Scheduled Meds: . apixaban  5 mg Oral BID  . vitamin C  500 mg Oral Daily  . calcium carbonate  1,250 mg Oral Q breakfast  . calcium carbonate  0.5 tablet Oral Q supper  . doxazosin  1 mg Oral QHS  . folic acid  1 mg Oral Daily  . hydroxychloroquine  200 mg Oral BID  . insulin aspart  0-20 Units Subcutaneous TID WC  . insulin aspart  0-5 Units Subcutaneous QHS  . [START ON 02/06/2020] insulin aspart  3 Units Subcutaneous TID WC  . methylPREDNISolone (SOLU-MEDROL) injection  80 mg Intravenous Q12H  . pantoprazole  40 mg Oral Daily  . pravastatin  80 mg Oral q1800  . vitamin B-12  1,000 mcg Oral Daily  . zinc sulfate  220 mg Oral Daily   Continuous Infusions: PRN Meds: acetaminophen **OR** acetaminophen, albuterol, butalbital-acetaminophen-caffeine, chlorpheniramine-HYDROcodone, guaiFENesin-dextromethorphan, methocarbamol, sodium chloride  Time spent: 35 minutes  Author: , MD [facility] 02/05/2020 9:31 PM  To reach On-call, see care teams to locate the attending and reach out via [email or web address]. Between 7PM-7AM, please contact night-coverage If you still have difficulty reaching the attending provider, please page the [facility] (Director on Call) for [facility] on amion for assistance.

## 2020-02-06 DIAGNOSIS — U071 COVID-19: Secondary | ICD-10-CM | POA: Diagnosis not present

## 2020-02-06 DIAGNOSIS — J9601 Acute respiratory failure with hypoxia: Secondary | ICD-10-CM | POA: Diagnosis not present

## 2020-02-06 LAB — BASIC METABOLIC PANEL
Anion gap: 10 (ref 5–15)
BUN: 24 mg/dL — ABNORMAL HIGH (ref 8–23)
CO2: 21 mmol/L — ABNORMAL LOW (ref 22–32)
Calcium: 7.6 mg/dL — ABNORMAL LOW (ref 8.9–10.3)
Chloride: 100 mmol/L (ref 98–111)
Creatinine, Ser: 0.78 mg/dL (ref 0.61–1.24)
GFR, Estimated: >60 mL/min (ref >60)
Glucose, Bld: 166 mg/dL — ABNORMAL HIGH (ref 70–99)
Potassium: 4.2 mmol/L (ref 3.5–5.1)
Sodium: 131 mmol/L — ABNORMAL LOW (ref 135–145)

## 2020-02-06 LAB — GLUCOSE, CAPILLARY
Glucose-Capillary: 213 mg/dL — ABNORMAL HIGH (ref 70–99)
Glucose-Capillary: 217 mg/dL — ABNORMAL HIGH (ref 70–99)
Glucose-Capillary: 235 mg/dL — ABNORMAL HIGH (ref 70–99)
Glucose-Capillary: 119 mg/dL — ABNORMAL HIGH (ref 70–99)

## 2020-02-06 LAB — CBC WITH DIFFERENTIAL/PLATELET
Abs Immature Granulocytes: 0.16 10*3/uL — ABNORMAL HIGH (ref 0.00–0.07)
Basophils Absolute: 0 10*3/uL (ref 0.0–0.1)
Basophils Relative: 0 %
Eosinophils Absolute: 0 10*3/uL (ref 0.0–0.5)
Eosinophils Relative: 0 %
HCT: 31.9 % — ABNORMAL LOW (ref 39.0–52.0)
Hemoglobin: 11.2 g/dL — ABNORMAL LOW (ref 13.0–17.0)
Immature Granulocytes: 1 %
Lymphocytes Relative: 3 %
Lymphs Abs: 0.4 10*3/uL — ABNORMAL LOW (ref 0.7–4.0)
MCH: 32.1 pg (ref 26.0–34.0)
MCHC: 35.1 g/dL (ref 30.0–36.0)
MCV: 91.4 fL (ref 80.0–100.0)
Monocytes Absolute: 0.6 10*3/uL (ref 0.1–1.0)
Monocytes Relative: 5 %
Neutro Abs: 12.2 10*3/uL — ABNORMAL HIGH (ref 1.7–7.7)
Neutrophils Relative %: 91 %
Platelets: 183 10*3/uL (ref 150–400)
RBC: 3.49 MIL/uL — ABNORMAL LOW (ref 4.22–5.81)
RDW: 15.1 % (ref 11.5–15.5)
WBC: 13.4 10*3/uL — ABNORMAL HIGH (ref 4.0–10.5)
nRBC: 0 % (ref 0.0–0.2)

## 2020-02-06 LAB — MAGNESIUM: Magnesium: 2.3 mg/dL (ref 1.7–2.4)

## 2020-02-06 LAB — C-REACTIVE PROTEIN: CRP: 2.4 mg/dL — ABNORMAL HIGH (ref <1.0)

## 2020-02-06 MED ORDER — FUROSEMIDE 10 MG/ML IJ SOLN
20.0000 mg | Freq: Once | INTRAMUSCULAR | Status: AC
  Administered 2020-02-06: 20 mg via INTRAVENOUS
  Filled 2020-02-06: qty 2

## 2020-02-06 NOTE — Progress Notes (Signed)
Occupational Therapy Treatment Patient Details Name: Javier Beard MRN: [id number] DOB: 02-20-1940 Today's Date: 02/06/2020    History of present illness Pt is a 80 y.o. male admitted 02/01/2020 with worsening SOB, nausea/vomitting. Workup for acute hypoxic respiratory failure due to COVID-19 PNA. CTA 1/14 negative for PE. PMH includes RA, DM2, afib, HF.   OT comments  Pt would only agree to bed level activity today. Pt engaging in bil UE/LE exercise for continued strengthening/endurance as precursor to EOB/OOB mobility and/or ADL. Pt remains with some confusion including delayed processing and requiring increased time, cues and intermittent repetition for command follow. Pt on RA upon arrival with SpO2 >92%, completed bed level session on RA with SpO2 maintaining >92% with activity. At this time continue to recommend post acute therapies at time of discharge. Acute OT to follow.    Follow Up Recommendations  SNF;Supervision/Assistance - 24 hour    Equipment Recommendations  None recommended by OT          Precautions / Restrictions Precautions Precautions: Fall;Other (comment) Precaution Comments: watch SpO2 Restrictions Weight Bearing Restrictions: No       Mobility Bed Mobility               General bed mobility comments: pt declined EOB  today        ADL either performed or assessed with clinical judgement   ADL Overall ADL's : Needs assistance/impaired                                       General ADL Comments: pt only agreeable to bed level activity today                       Cognition Arousal/Alertness: Awake/alert Behavior During Therapy: Flat affect Overall Cognitive Status: No family/caregiver present to determine baseline cognitive functioning Area of Impairment: Attention;Following commands;Safety/judgement;Awareness;Problem solving                   Current Attention Level: Selective   Following Commands: Follows one  step commands consistently Safety/Judgement: Decreased awareness of deficits;Decreased awareness of safety Awareness: Intellectual Problem Solving: Requires verbal cues          Exercises Exercises: General Upper Extremity;General Lower Extremity General Exercises - Upper Extremity Shoulder Flexion: AROM;Both;10 reps Shoulder Horizontal ABduction: AROM;Both;10 reps Shoulder Horizontal ADduction: AROM;Both;10 reps Elbow Flexion: AROM;Both;10 reps Elbow Extension: AROM;Both;10 reps General Exercises - Lower Extremity Ankle Circles/Pumps: AROM;Both;10 reps Straight Leg Raises: AROM;Both;10 reps Hip Flexion/Marching: AAROM;AROM;Both;10 reps   Shoulder Instructions       General Comments      Pertinent Vitals/ Pain       Pain Assessment: Faces Faces Pain Scale: No hurt Pain Intervention(s): Monitored during session  Home Living                                          Prior Functioning/Environment              Frequency  Min 3X/week        Progress Toward Goals  OT Goals(current goals can now be found in the care plan section)  Progress towards OT goals: OT to reassess next treatment  Acute Rehab OT Goals Patient Stated Goal: to go home and shower OT Goal Formulation: With patient  Time For Goal Achievement: 02/16/20 Potential to Achieve Goals: Good ADL Goals Pt Will Perform Grooming: with min assist;standing Pt Will Perform Upper Body Bathing: sitting;with min assist Pt Will Perform Lower Body Bathing: with min assist;sit to/from stand Pt Will Transfer to Toilet: with min guard assist;ambulating;regular height toilet;bedside commode;grab bars Pt Will Perform Toileting - Clothing Manipulation and hygiene: with min guard assist;sit to/from stand Pt/caregiver will Perform Home Exercise Program: Increased strength;Both right and left upper extremity;With theraband;With Supervision;With written HEP provided Additional ADL Goal #1: Pt will  actively participate in 20 mins therapeutic activity with no more than 2 rest breaks and Sp02 >88%  Plan Discharge plan remains appropriate    Co-evaluation                 AM-PAC OT "6 Clicks" Daily Activity     Outcome Measure   Help from another person eating meals?: None Help from another person taking care of personal grooming?: A Little Help from another person toileting, which includes using toliet, bedpan, or urinal?: A Lot Help from another person bathing (including washing, rinsing, drying)?: A Lot Help from another person to put on and taking off regular upper body clothing?: A Lot Help from another person to put on and taking off regular lower body clothing?: Total 6 Click Score: 14    End of Session    OT Visit Diagnosis: Unsteadiness on feet (R26.81)   Activity Tolerance Other (comment) (self-limiting)   Patient Left in bed;with call bell/phone within reach;with bed alarm set   Nurse Communication Mobility status        Time: 1457-1510 OT Time Calculation (min): 13 min  Charges: OT General Charges $OT Visit: 1 Visit OT Treatments $Therapeutic Activity: 8-22 mins  Marcy Siren, OT Acute Rehabilitation Services Pager [phone] Office [phone]    Orlando Penner 02/06/2020, 4:51 PM

## 2020-02-06 NOTE — Progress Notes (Addendum)
[facility] Progress Note  Patient: Javier Beard    WUJ:[id number]  DOA: 01-31-2020     Date of Service: the patient was seen and examined on 02/06/2020  Brief hospital course: Possible due to history of rheumatoid arthritis, type II DM, A. fib, chronic HFpEF.  Presents with complaints of cough and shortness of breath.  Found to have COVID-19 pneumonia with hypoxia  Currently plan is continue supportive care for hypoxia.  Assessment and Plan: 1. Acute Hypoxic respiratory failure, POA, 92 % on room air on admission.  Acute COVID-19 Viral Pneumonia CXR: hazy bilateral peripheral opacities CTA chest PE protocol 1/14; negative for PE Oxygen requirement: At rest 2 LPM, on exertion needing nonrebreather +15 LPM.  CRP: From 17.6 dropped down to 3.2, peaked again at 16.3 now 2.4. Remdesivir: Completed on 1/11 Steroids: Back on IV Solu-Medrol due to elevated CRP and increasing oxygen demand Baricitinib/Actemra: Not initiated, given patient is already day 11 in the hospital and had symptoms of fatigue and shortness of breath for almost a week and a half prior to admission do not think that benefits outweigh the risk of this medication.  Therefore not indicated. Antibiotics: Not indicated DVT Prophylaxis:  apixaban (ELIQUIS) tablet 5 mg  Prone positioning and incentive spirometer use recommended.  Overall plan: Continue with IV steroids.  Monitor for improvement in exertional oxygenation, will continue diuresis switching from oral Lasix to IV Lasix.  The treatment plan and use of medications and known side effects were discussed with patient/family. It was clearly explained that complete risks and long-term side effects are unknown. Patient/family agree with the treatment plan.    2.  Rheumatoid arthritis Continue Plaquenil. Monitor.  Holding methotrexate.  3.  Hyponatremia Corrected. Monitor, likely lab error.  4.  Type 2 diabetes mellitus uncontrolled with hyperglycemia and  hypoglycemia without any complication Currently on sliding scale insulin, regimen adjusted Back on IV Solu-Medrol. Discontinue glipizide.  5.  Permanent A. fib On anticoagulation with Eliquis.  Rate controlled.  Without any medication.  6.  Chronic HFpEF, volume overload but no acute decompensation Orthodeoxia Patient was on 60 mg of oral Lasix. Continue with IV Lasix. Echocardiogram negative for any acute abnormality per Echocardiogram with bubble study also negative for any intra-chamber shunt.   Diet: Cardiac diet DVT Prophylaxis:    apixaban (ELIQUIS) tablet 5 mg    Advance goals of care discussion: Full code  Family Communication: no family was present at bedside, at the time of interview.  Discussed on the phone with the family.  On 1/17. Unable to discuss with the family on 1/18. Disposition:  Status is: Inpatient  Remains inpatient appropriate because:Inpatient level of care appropriate due to severity of illness   Dispo: The patient is from: Home              Anticipated d/c is to: Home PT recommends SNF although patient wants to go home.              Anticipated d/c date is: 3 days              Patient currently is not medically stable to d/c.  Subjective: Continues to have shortness of breath, continues to deny chest pain.  No nausea or vomiting.  Oral intake is adequate.  Physical Exam:  General: Appear in mild distress, no Rash; Oral Mucosa Clear, moist. no Abnormal Neck Mass Or lumps, Conjunctiva normal  Cardiovascular: S1 and S2 Present, no Murmur, Respiratory: increased respiratory effort, Bilateral Air entry  present and bilateral  Crackles, no wheezes Abdomen: Bowel Sound present, Soft and no tenderness Extremities: trace Pedal edema Neurology: alert and oriented to time, place, and person affect appropriate. no new focal deficit Gait not checked due to patient safety concerns  Vitals:   02/06/20 0354 02/06/20 0756 02/06/20 1139 02/06/20 1600   BP: 129/70 123/74 128/63 117/67  Pulse: 74 75 68 68  Resp: 20 20 20 20   Temp: (!) 97.3 F (36.3 C) 98.5 F (36.9 C) 98.3 F (36.8 C) 97.6 F (36.4 C)  TempSrc: Oral Oral Oral Oral  SpO2: 96% 91% 94% 93%  Weight:      Height:        Intake/Output Summary (Last 24 hours) at 02/06/2020 1613 Last data filed at 02/06/2020 0401 Gross per 24 hour  Intake 240 ml  Output 775 ml  Net -535 ml   Filed Weights   02/21/2020 1316 02/03/20 0410  Weight: 81.6 kg 72.6 kg    Data Reviewed: I have personally reviewed and interpreted daily labs, tele strips, imaging. I reviewed all nursing notes, pharmacy notes, vitals, pertinent old records I have discussed plan of care as described above with RN and patient/family.  CBC: Recent Labs  Lab 01/31/20 0107 02/01/20 0225 02/02/20 0815 02/06/20 0158  WBC 8.4 7.8 9.7 13.4*  NEUTROABS 7.2 6.4 8.2* 12.2*  HGB 10.6* 10.8* 11.3* 11.2*  HCT 30.1* 30.5* 32.1* 31.9*  MCV 92.6 90.0 91.7 91.4  PLT 265 257 277 183   Basic Metabolic Panel: Recent Labs  Lab 02/02/20 0815 02/03/20 0912 02/04/20 0206 02/05/20 0143 02/06/20 0158  NA 129* 132* 131* 128* 131*  K 3.8 4.0 4.2 4.2 4.2  CL 101 101 102 99 100  CO2 16* 20* 20* 20* 21*  GLUCOSE 180* 229* 278* 256* 166*  BUN 19 22 29* 24* 24*  CREATININE 0.86 0.97 1.01 0.98 0.78  CALCIUM 7.7* 8.0* 7.8* 7.4* 7.6*  MG 2.1  --   --   --  2.3    Studies: No results found.  Scheduled Meds: . apixaban  5 mg Oral BID  . vitamin C  500 mg Oral Daily  . calcium carbonate  1,250 mg Oral Q breakfast  . calcium carbonate  0.5 tablet Oral Q supper  . doxazosin  1 mg Oral QHS  . folic acid  1 mg Oral Daily  . hydroxychloroquine  200 mg Oral BID  . insulin aspart  0-20 Units Subcutaneous TID WC  . insulin aspart  0-5 Units Subcutaneous QHS  . insulin aspart  3 Units Subcutaneous TID WC  . methylPREDNISolone (SOLU-MEDROL) injection  80 mg Intravenous Q12H  . pantoprazole  40 mg Oral Daily  . pravastatin  80  mg Oral q1800  . vitamin B-12  1,000 mcg Oral Daily  . zinc sulfate  220 mg Oral Daily   Continuous Infusions: PRN Meds: acetaminophen **OR** acetaminophen, albuterol, butalbital-acetaminophen-caffeine, chlorpheniramine-HYDROcodone, guaiFENesin-dextromethorphan, methocarbamol, sodium chloride  Time spent: 35 minutes  Author: 02/08/20, MD [facility] 02/06/2020 4:13 PM  To reach On-call, see care teams to locate the attending and reach out via [email or web address]. Between 7PM-7AM, please contact night-coverage If you still have difficulty reaching the attending provider, please page the [facility] (Director on Call) for [facility] on amion for assistance.

## 2020-02-07 DIAGNOSIS — U071 COVID-19: Secondary | ICD-10-CM | POA: Diagnosis not present

## 2020-02-07 DIAGNOSIS — J9601 Acute respiratory failure with hypoxia: Secondary | ICD-10-CM | POA: Diagnosis not present

## 2020-02-07 LAB — CBC WITH DIFFERENTIAL/PLATELET
Abs Immature Granulocytes: 0.15 10*3/uL — ABNORMAL HIGH (ref 0.00–0.07)
Basophils Absolute: 0 10*3/uL (ref 0.0–0.1)
Basophils Relative: 0 %
Eosinophils Absolute: 0 10*3/uL (ref 0.0–0.5)
Eosinophils Relative: 0 %
HCT: 36.3 % — ABNORMAL LOW (ref 39.0–52.0)
Hemoglobin: 12.2 g/dL — ABNORMAL LOW (ref 13.0–17.0)
Immature Granulocytes: 1 %
Lymphocytes Relative: 4 %
Lymphs Abs: 0.5 10*3/uL — ABNORMAL LOW (ref 0.7–4.0)
MCH: 30.7 pg (ref 26.0–34.0)
MCHC: 33.6 g/dL (ref 30.0–36.0)
MCV: 91.2 fL (ref 80.0–100.0)
Monocytes Absolute: 0.7 10*3/uL (ref 0.1–1.0)
Monocytes Relative: 5 %
Neutro Abs: 11.1 10*3/uL — ABNORMAL HIGH (ref 1.7–7.7)
Neutrophils Relative %: 90 %
Platelets: 167 10*3/uL (ref 150–400)
RBC: 3.98 MIL/uL — ABNORMAL LOW (ref 4.22–5.81)
RDW: 14.8 % (ref 11.5–15.5)
WBC: 12.5 10*3/uL — ABNORMAL HIGH (ref 4.0–10.5)
nRBC: 0 % (ref 0.0–0.2)

## 2020-02-07 LAB — BASIC METABOLIC PANEL
Anion gap: 9 (ref 5–15)
BUN: 24 mg/dL — ABNORMAL HIGH (ref 8–23)
CO2: 20 mmol/L — ABNORMAL LOW (ref 22–32)
Calcium: 7.6 mg/dL — ABNORMAL LOW (ref 8.9–10.3)
Chloride: 99 mmol/L (ref 98–111)
Creatinine, Ser: 0.76 mg/dL (ref 0.61–1.24)
GFR, Estimated: >60 mL/min (ref >60)
Glucose, Bld: 180 mg/dL — ABNORMAL HIGH (ref 70–99)
Potassium: 4.6 mmol/L (ref 3.5–5.1)
Sodium: 128 mmol/L — ABNORMAL LOW (ref 135–145)

## 2020-02-07 LAB — MAGNESIUM: Magnesium: 2.3 mg/dL (ref 1.7–2.4)

## 2020-02-07 LAB — C-REACTIVE PROTEIN: CRP: 1.4 mg/dL — ABNORMAL HIGH (ref <1.0)

## 2020-02-07 LAB — GLUCOSE, CAPILLARY
Glucose-Capillary: 162 mg/dL — ABNORMAL HIGH (ref 70–99)
Glucose-Capillary: 203 mg/dL — ABNORMAL HIGH (ref 70–99)
Glucose-Capillary: 211 mg/dL — ABNORMAL HIGH (ref 70–99)
Glucose-Capillary: 239 mg/dL — ABNORMAL HIGH (ref 70–99)

## 2020-02-07 MED ORDER — SENNOSIDES-DOCUSATE SODIUM 8.6-50 MG PO TABS: 1.0000 | ORAL_TABLET | Freq: Every evening | ORAL | Status: DC | PRN

## 2020-02-07 MED ORDER — METHYLPREDNISOLONE SODIUM SUCC 40 MG IJ SOLR
40.0000 mg | Freq: Two times a day (BID) | INTRAMUSCULAR | Status: DC
  Administered 2020-02-07: 40 mg via INTRAVENOUS
  Filled 2020-02-07: qty 1

## 2020-02-07 NOTE — Progress Notes (Signed)
PROGRESS NOTE    Javier Beard  ZOX:[id number] DOB: [birth year] DOA: 30-Jan-2020 PCP: Lenox Ponds, MD   Brief Narrative:  80 yo with past history of rheumatoid arthritis, type II DM, A. fib, chronic HFpEF. Presents with complaints of cough and shortness of breath. Found to have COVID-19 pneumonia with hypoxia   Assessment & Plan:   Principal Problem:   Acute hypoxemic respiratory failure due to COVID-19 [facility]) Active Problems:   Type 2 diabetes mellitus with vascular disease (HCC)   Rheumatoid arthritis (HCC)   PAF (paroxysmal atrial fibrillation) (HCC)   CAD (coronary artery disease)   Pneumonia due to COVID-19 virus   Hyponatremia   Multifocal pneumonia  Acute respiratory Distress due to COVID 19 Pneumonia -Oxygen levels- RA, with intermittent hypoxia.  -Remdesivir-Complete 1/11 -Solumedrol-  q12hrs -Actemra/baricitinab- not given.  -Chest x-ray-b/l opacities.  -Supportive care-antitussive, inhalers, I-S/flutter -CODE STATUS confirmed -Vitamin C & Zinc. Prone >16hrs/day.  -Routine: Labs have been reviewed including ferritin, LDH, CRP, d-dimer, fibrinogen.  Will need to trend this lab daily.  Rheumatoid arthritis Continue Plaquenil.  Methotrexate on hold  DM2 uncontrolled Sliding scale and Accu-Chek.  Permanent A. fib Rate controlled.  On Eliquis.  Chronic HFpEF, volume overload but no acute decompensation Orthodeoxia Lasix currently on hold.  Closely monitor his volume status.   DVT prophylaxis: Eliquis Code Status: Full  Family Communication:  Spoke with his daughter.   Status is: Inpatient  Remains inpatient appropriate because:Inpatient level of care appropriate due to severity of illness   Dispo: The patient is from: Home              Anticipated d/c is to: Home              Anticipated d/c date is: 2 days              Patient currently is not medically stable to d/c.       Body mass index is 25.83 kg/m.       Subjective: Overnight patient was hypoxic requiring high flow.  This morning doing little better.  Reporting of lower extremity pain. Gets short of breath with minimal movement  Review of Systems Otherwise negative except as per HPI, including: General: Denies fever, chills, night sweats or unintended weight loss. Resp: Denies cough, wheezing, shortness of breath. Cardiac: Denies chest pain, palpitations, orthopnea, paroxysmal nocturnal dyspnea. GI: Denies abdominal pain, nausea, vomiting, diarrhea or constipation GU: Denies dysuria, frequency, hesitancy or incontinence MS: Denies swelling Neuro: Denies headache, neurologic deficits (focal weakness, numbness, tingling), abnormal gait Psych: Denies anxiety, depression, SI/HI/AVH Skin: Denies new rashes or lesions ID: Denies sick contacts, exotic exposures, travel  Examination:  General exam: Appears calm and comfortable  Respiratory system: Bilateral rhonchi Cardiovascular system: S1 & S2 heard, RRR. No JVD, murmurs, rubs, gallops or clicks. No pedal edema. Gastrointestinal system: Abdomen is nondistended, soft and nontender. No organomegaly or masses felt. Normal bowel sounds heard. Central nervous system: Alert and oriented. No focal neurological deficits. Extremities: Symmetric 5 x 5 power. Skin: No rashes, lesions or ulcers Psychiatry: Judgement and insight appear normal. Mood & affect appropriate.     Objective: Vitals:   02/07/20 0017 02/07/20 0411 02/07/20 0806 02/07/20 1307  BP: 117/70 113/73 136/70 123/63  Pulse: 67 76 68 63  Resp: Temp: (!) 97 F (36.1 C) 97.6 F (36.4 C) 97.6 F (36.4 C) 97.6 F (36.4 C)  TempSrc: Axillary Oral Oral Oral  SpO2: 99% 98% 91%  Weight:      Height:        Intake/Output Summary (Last 24 hours) at 02/07/2020 1611 Last data filed at 02/07/2020 1450 Gross per 24 hour  Intake 240 ml  Output 1201 ml  Net -961 ml   Filed Weights   02/13/2020 1316 02/03/20 0410   Weight: 81.6 kg 72.6 kg     Data Reviewed:   CBC: Recent Labs  Lab 02/01/20 0225 02/02/20 0815 02/06/20 0158 02/07/20 0353  WBC 7.8 9.7 13.4* 12.5*  NEUTROABS 6.4 8.2* 12.2* 11.1*  HGB 10.8* 11.3* 11.2* 12.2*  HCT 30.5* 32.1* 31.9* 36.3*  MCV 90.0 91.7 91.4 91.2  PLT 257 277 183 167   Basic Metabolic Panel: Recent Labs  Lab 02/02/20 0815 02/03/20 0912 02/04/20 0206 02/05/20 0143 02/06/20 0158 02/07/20 0353  NA 129* 132* 131* 128* 131* 128*  K 3.8 4.0 4.2 4.2 4.2 4.6  CL 101 101 102 99 100 99  CO2 16* 20* 20* 20* 21* 20*  GLUCOSE 180* 229* 278* 256* 166* 180*  BUN 19 22 29* 24* 24* 24*  CREATININE 0.86 0.97 1.01 0.98 0.78 0.76  CALCIUM 7.7* 8.0* 7.8* 7.4* 7.6* 7.6*  MG 2.1  --   --   --  2.3 2.3   GFR: Estimated Creatinine Clearance: 69.8 mL/min (by C-G formula based on SCr of 0.76 mg/dL). Liver Function Tests: Recent Labs  Lab 02/01/20 0225 02/02/20 0815  AST 19 18  ALT 21 19  ALKPHOS 37* 37*  BILITOT 0.8 0.9  PROT 5.3* 5.5*  ALBUMIN 2.3* 2.3*   No results for input(s): LIPASE, AMYLASE in the last 168 hours. No results for input(s): AMMONIA in the last 168 hours. Coagulation Profile: No results for input(s): INR, PROTIME in the last 168 hours. Cardiac Enzymes: No results for input(s): CKTOTAL, CKMB, CKMBINDEX, TROPONINI in the last 168 hours. BNP (last 3 results) No results for input(s): PROBNP in the last 8760 hours. HbA1C: No results for input(s): HGBA1C in the last 72 hours. CBG: Recent Labs  Lab 02/06/20 1220 02/06/20 1642 02/06/20 2114 02/07/20 0833 02/07/20 1303  GLUCAP 217* 235* 119* 162* 203*   Lipid Profile: No results for input(s): CHOL, HDL, LDLCALC, TRIG, CHOLHDL, LDLDIRECT in the last 72 hours. Thyroid Function Tests: No results for input(s): TSH, T4TOTAL, FREET4, T3FREE, THYROIDAB in the last 72 hours. Anemia Panel: No results for input(s): VITAMINB12, FOLATE, FERRITIN, TIBC, IRON, RETICCTPCT in the last 72 hours. Sepsis  Labs: No results for input(s): PROCALCITON, LATICACIDVEN in the last 168 hours.  No results found for this or any previous visit (from the past 240 hour(s)).       Radiology Studies: No results found.      Scheduled Meds: . apixaban  5 mg Oral BID  . vitamin C  500 mg Oral Daily  . calcium carbonate  1,250 mg Oral Q breakfast  . calcium carbonate  0.5 tablet Oral Q supper  . doxazosin  1 mg Oral QHS  . folic acid  1 mg Oral Daily  . hydroxychloroquine  200 mg Oral BID  . insulin aspart  0-20 Units Subcutaneous TID WC  . insulin aspart  0-5 Units Subcutaneous QHS  . insulin aspart  3 Units Subcutaneous TID WC  . methylPREDNISolone (SOLU-MEDROL) injection  40 mg Intravenous Q12H  . pantoprazole  40 mg Oral Daily  . pravastatin  80 mg Oral q1800  . vitamin B-12  1,000 mcg Oral Daily  . zinc sulfate  220 mg Oral  Daily   Continuous Infusions:   LOS: 11 days   Time spent= 35 mins    Ankit Joline Maxcy, MD [facility]  If 7PM-7AM, please contact night-coverage  02/07/2020, 4:11 PM

## 2020-02-07 NOTE — TOC Progression Note (Addendum)
Transition of Care [facility]) - Progression Note    Patient Details  Name: Javier Beard MRN: [id number] Date of Birth: 07-19-1940  Transition of Care [facility]) CM/SW Contact  Mearl Latin, LCSW Phone Number: 02/07/2020, 9:31 AM  Clinical Narrative:    9:31am-CSW spoke with patient regarding discharge plan. He is adamantly refusing SNF placement. He reported that he will find some friends to come help him at home. CSW explained that he has not been able to do much here at the hospital and want to make sure he is safe. He stated that he owns a home and why would he not want to go there if he could? He is requesting home health services. He stated that his daughter Babette Relic is his POA and I can discuss it with her. CSW asked patient what he would say if Tammy thinks he needs to go to rehab and he reported that it didn't matter and he was not going anywhere but home. CSW spoke with Tammy. She reported that she has concerns about patient going home alone and requested CSW call her back once PT had worked with him today (she also asked about pulmonary rehab). She reported that he is stubborn but smart and does best when things are written down. She stated that she will do what she has to if he really will not agree to SNF but is hopeful she can change his mind. He has a wheelchair, walker, and cane at home. Oxygen has already been delivered through [employer]. CSW will follow up.    3:45pm-CSW spoke with patient's daughter and updated her that patient did not want to participate in therapy. She reported patient had informed her due to foot pain. She requested patient be given tylenol daily for his arthritis along with Methotrexate on Wednesdays. CSW made RN aware of request. Daughter is unsure of what to do with patient as he is still adamantly refusing SNF. She is requesting to come see him again to convince him in person; CSW will ask charge RN.    Expected Discharge Plan: Home w Home Health Services Barriers to  Discharge: Continued Medical Work up  Expected Discharge Plan and Services Expected Discharge Plan: Home w Home Health Services In-house Referral: Clinical Social Work Discharge Planning Services: CM Consult Post Acute Care Choice: Home Health Living arrangements for the past 2 months: Single Family Home Expected Discharge Date: 02/01/20                                     Social Determinants of Health (SDOH) Interventions    Readmission Risk Interventions No flowsheet data found.

## 2020-02-07 NOTE — Progress Notes (Signed)
91%-96% on room air at rest. When transferred to [facility], exertional hypoxia low 70's with mouth breathing requiring about 10LPM of O2 via HFNL to recover in about 5 minutes. After lunch offered to assist patient to the chair he said he just didn't feel like it.

## 2020-02-07 NOTE — Progress Notes (Signed)
PT Cancellation Note  Patient Details Name: Javier Beard MRN: [id number] DOB: Sep 07, 1940   Cancelled Treatment:    Reason Eval/Treat Not Completed: Patient declined, no reason specified  Pt declined PT today due to "not feeling like it."  Tried to encourage and educate on importance of activity but pt reports he sat in chair earlier and his feet hurt and he doesn't want to walk on them.  Spent at least 5 mins trying to reason/educate but pt just reports "not today."  Will f/u as able. Anise Salvo, PT Acute Rehab Services Pager [phone] [facility] [phone]   Rayetta Humphrey 02/07/2020, 3:36 PM

## 2020-02-08 ENCOUNTER — Inpatient Hospital Stay (HOSPITAL_COMMUNITY): Payer: Medicare Other

## 2020-02-08 DIAGNOSIS — U071 COVID-19: Secondary | ICD-10-CM | POA: Diagnosis not present

## 2020-02-08 DIAGNOSIS — J9601 Acute respiratory failure with hypoxia: Secondary | ICD-10-CM | POA: Diagnosis not present

## 2020-02-08 LAB — GLUCOSE, CAPILLARY
Glucose-Capillary: 220 mg/dL — ABNORMAL HIGH (ref 70–99)
Glucose-Capillary: 101 mg/dL — ABNORMAL HIGH (ref 70–99)
Glucose-Capillary: 221 mg/dL — ABNORMAL HIGH (ref 70–99)
Glucose-Capillary: 146 mg/dL — ABNORMAL HIGH (ref 70–99)

## 2020-02-08 LAB — BASIC METABOLIC PANEL
Anion gap: 9 (ref 5–15)
BUN: 27 mg/dL — ABNORMAL HIGH (ref 8–23)
CO2: 20 mmol/L — ABNORMAL LOW (ref 22–32)
Calcium: 7.6 mg/dL — ABNORMAL LOW (ref 8.9–10.3)
Chloride: 99 mmol/L (ref 98–111)
Creatinine, Ser: 0.9 mg/dL (ref 0.61–1.24)
GFR, Estimated: >60 mL/min (ref >60)
Glucose, Bld: 212 mg/dL — ABNORMAL HIGH (ref 70–99)
Potassium: 4.2 mmol/L (ref 3.5–5.1)
Sodium: 128 mmol/L — ABNORMAL LOW (ref 135–145)

## 2020-02-08 LAB — D-DIMER, QUANTITATIVE: D-Dimer, Quant: 1.36 ug/mL-FEU — ABNORMAL HIGH (ref 0.00–0.50)

## 2020-02-08 LAB — BRAIN NATRIURETIC PEPTIDE: B Natriuretic Peptide: 86.6 pg/mL (ref 0.0–100.0)

## 2020-02-08 LAB — TROPONIN I (HIGH SENSITIVITY): Troponin I (High Sensitivity): 15 ng/L (ref <18)

## 2020-02-08 LAB — CBC
HCT: 32.9 % — ABNORMAL LOW (ref 39.0–52.0)
Hemoglobin: 11.4 g/dL — ABNORMAL LOW (ref 13.0–17.0)
MCH: 31.7 pg (ref 26.0–34.0)
MCHC: 34.7 g/dL (ref 30.0–36.0)
MCV: 91.4 fL (ref 80.0–100.0)
Platelets: 169 10*3/uL (ref 150–400)
RBC: 3.6 MIL/uL — ABNORMAL LOW (ref 4.22–5.81)
RDW: 15 % (ref 11.5–15.5)
WBC: 12.9 10*3/uL — ABNORMAL HIGH (ref 4.0–10.5)
nRBC: 0 % (ref 0.0–0.2)

## 2020-02-08 LAB — BLOOD GAS, VENOUS
Acid-base deficit: 5.2 mmol/L — ABNORMAL HIGH (ref 0.0–2.0)
Bicarbonate: 18.8 mmol/L — ABNORMAL LOW (ref 20.0–28.0)
Drawn by: 5680
FIO2: 44
O2 Saturation: 86.2 %
Patient temperature: 37
pCO2, Ven: 31 mmHg — ABNORMAL LOW (ref 44.0–60.0)
pH, Ven: 7.4 (ref 7.250–7.430)
pO2, Ven: 56 mmHg — ABNORMAL HIGH (ref 32.0–45.0)

## 2020-02-08 LAB — MAGNESIUM: Magnesium: 2.3 mg/dL (ref 1.7–2.4)

## 2020-02-08 LAB — C-REACTIVE PROTEIN: CRP: 2.1 mg/dL — ABNORMAL HIGH (ref <1.0)

## 2020-02-08 IMAGING — DX DG CHEST 1V PORT
1 series · 1 of 1 positions shown · non-contrast
Comparison: CT [DATE], radiograph [DATE]

CLINICAL DATA: Acute respiratory failure with hypoxia

EXAM:
PORTABLE CHEST 1 VIEW

[chest ap]
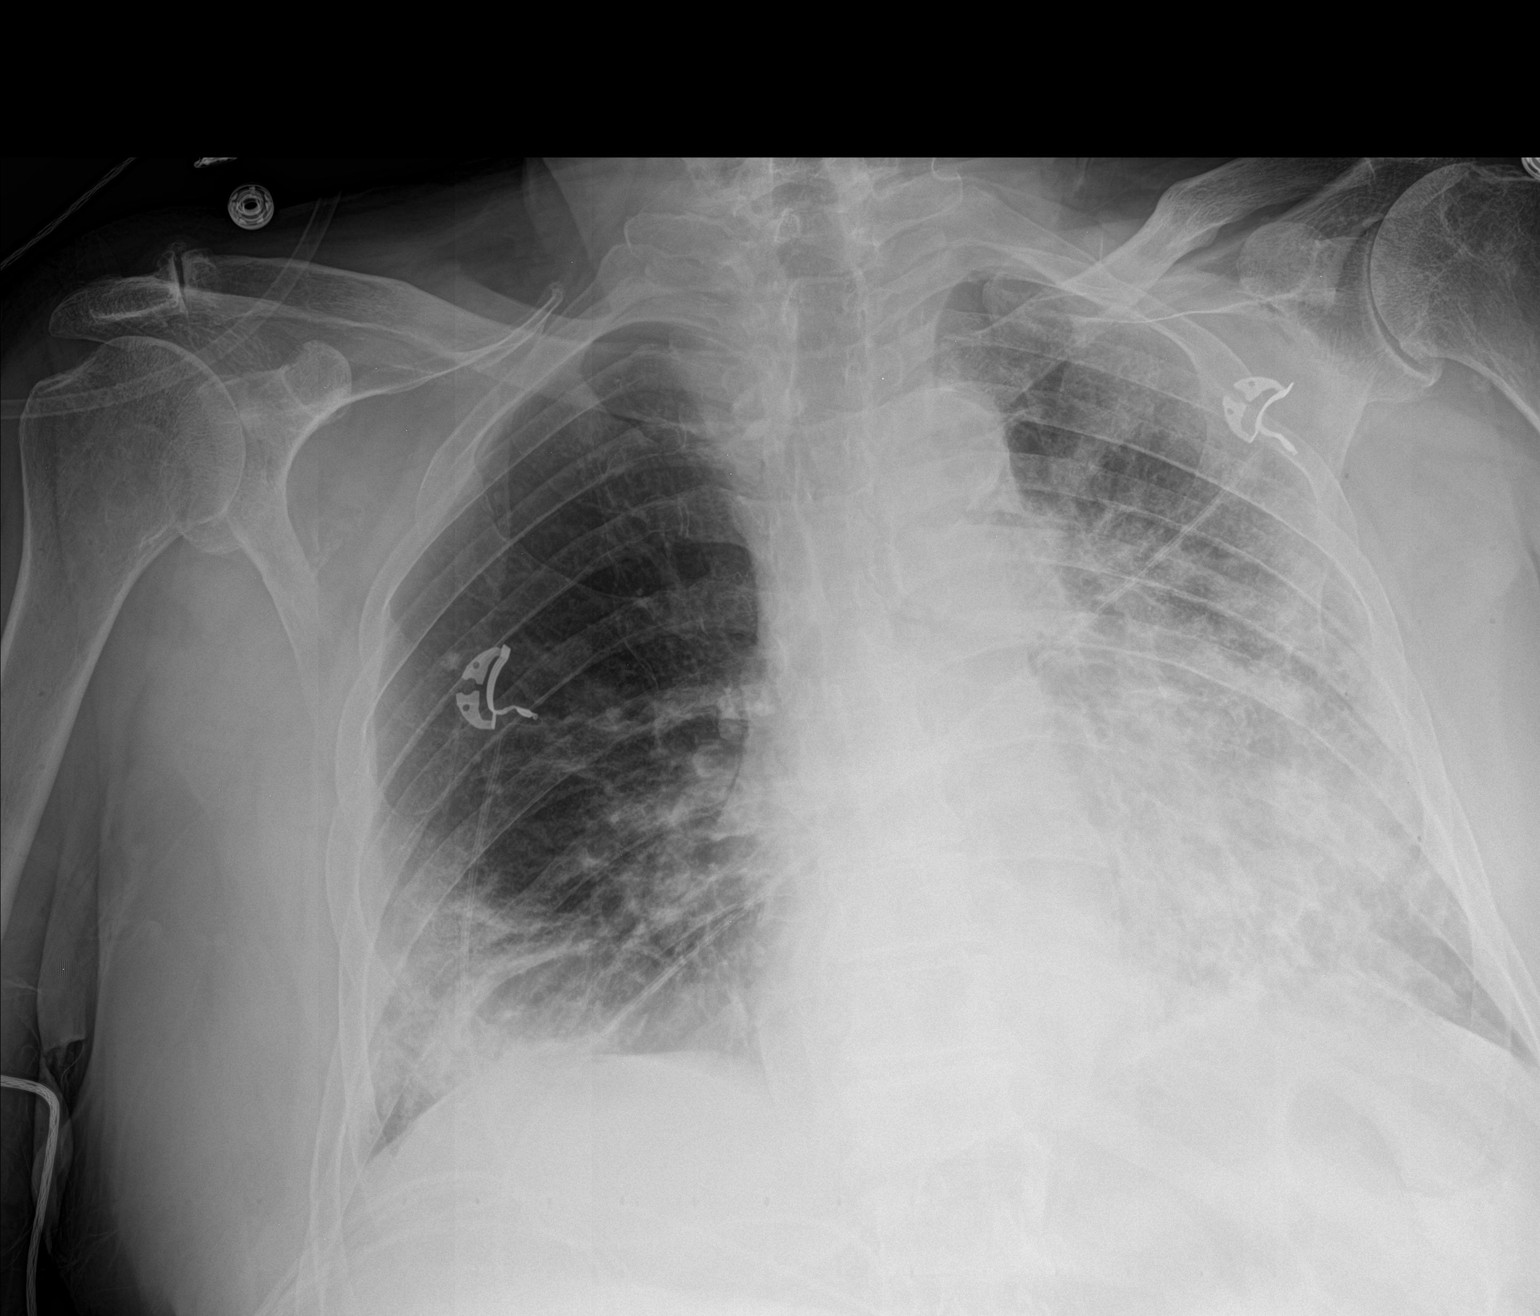

[1 of 1 positions shown; findings below may reference images not displayed]

FINDINGS: Persistent heterogeneous areas of mixed consolidative and
interstitial opacity throughout both lungs, left greater than right.
No visible pneumothorax or effusion. Telemetry leads overlie the
chest. Probable cardiomegaly though portions of the cardiac
silhouette are obscured. Hiatal hernia is again seen. The remaining
visible cardiomediastinal contours are unremarkable aside from a
calcified aorta. No acute osseous or soft tissue abnormality.
Degenerative changes are present in the imaged spine and shoulders.
Telemetry leads overlie the chest.
IMPRESSION: Persistent heterogeneous areas of mixed consolidative and
interstitial opacity throughout both lungs, left greater than right.
Findings concerning for persisting infection in the setting of
[33].

Stable cardiomegaly.

Hiatal hernia.

## 2020-02-08 MED ORDER — METHYLPREDNISOLONE SODIUM SUCC 40 MG IJ SOLR
40.0000 mg | Freq: Every day | INTRAMUSCULAR | Status: DC
  Administered 2020-02-08 – 2020-02-12 (×5): 40 mg via INTRAVENOUS
  Filled 2020-02-08 (×5): qty 1

## 2020-02-08 MED ORDER — MORPHINE SULFATE (PF) 4 MG/ML IV SOLN
4.0000 mg | Freq: Once | INTRAVENOUS | Status: AC
  Administered 2020-02-08: 4 mg via INTRAVENOUS
  Filled 2020-02-08: qty 1

## 2020-02-08 MED ORDER — SODIUM CHLORIDE 0.9 % IV BOLUS
500.0000 mL | Freq: Once | INTRAVENOUS | Status: AC
  Administered 2020-02-08: 500 mL via INTRAVENOUS

## 2020-02-08 MED ORDER — GLIPIZIDE 5 MG PO TABS: 5.0000 mg | ORAL_TABLET | Freq: Every day | ORAL | Status: DC

## 2020-02-08 NOTE — Care Management Important Message (Signed)
Important Message  Patient Details  Name: Javier Beard MRN: [id number] Date of Birth: 07/13/1940   Medicare Important Message Given:  Yes - Important Message mailed due to current National Emergency   Verbal consent obtained due to current National Emergency  Relationship to patient: Self Contact Name: Javier Beard Call Date: 02/08/20  Time: 1434 Phone: [phone] Outcome: Spoke with contact Important Message mailed to: Patient address on file     Orson Aloe 02/08/2020, 2:34 PM

## 2020-02-08 NOTE — Progress Notes (Signed)
PROGRESS NOTE    Javier Beard  DJM:[id number] DOB: 1940-06-21 DOA: 01/27/2020 PCP: Lenox Ponds, MD   Brief Narrative:  80 yo with past history of rheumatoid arthritis, type II DM, A. fib, chronic HFpEF. Presents with complaints of cough and shortness of breath. Found to have COVID-19 pneumonia with hypoxia.  During hospitalization he completed treatment for remdesivir and was getting steroids.  Slowly his oxygen levels improved.  OT recommended SNF and patient refused to PT as he did not want to go to SNF despite of several conversations with him.   Assessment & Plan:   Principal Problem:   Acute hypoxemic respiratory failure due to COVID-19 [facility]) Active Problems:   Type 2 diabetes mellitus with vascular disease (HCC)   Rheumatoid arthritis (HCC)   PAF (paroxysmal atrial fibrillation) (HCC)   CAD (coronary artery disease)   Pneumonia due to COVID-19 virus   Hyponatremia   Multifocal pneumonia  Acute respiratory Distress due to COVID 19 Pneumonia -Oxygen levels- remains on room air -Remdesivir-Complete 1/11 -Solumedrol- reduced to 40 mg daily -Actemra/baricitinab- not given.  -Chest x-ray-b/l opacities.  -Supportive care-antitussive, inhalers, I-S/flutter -CODE STATUS confirmed -Vitamin C & Zinc. Prone >16hrs/day.  -Routine: Labs have been reviewed including ferritin, LDH, CRP, d-dimer, fibrinogen.  Will need to trend this lab daily.  Rheumatoid arthritis Continue Plaquenil.  Methotrexate on hold  DM2 uncontrolled Sliding scale and Accu-Chek. Restart glipizide 5 mg daily  Permanent A. fib Rate controlled.  On Eliquis.  Chronic HFpEF, volume overload but no acute decompensation Orthodeoxia Lasix currently on hold.  Closely monitor his volume status.   DVT prophylaxis: Eliquis Code Status: Full  Family Communication:  Spoke with his daughter on 1/19  Status is: Inpatient  Remains inpatient appropriate because:Inpatient level of care appropriate due to  severity of illness   Dispo: The patient is from: Home              Anticipated d/c is to: Home              Anticipated d/c date is: 2 days              Patient currently is not medically stable to d/c.       Body mass index is 25.83 kg/m.      Subjective: Seen and examined at bedside, still reporting of joint pain which has been chronic for him.  Keeps telling me he wants to get out of bed but I have advised him that he needs to get up with physical therapy when he is requested to do so.  Patient's RN in the room with me during my evaluation  Review of Systems Otherwise negative except as per HPI, including: General: Denies fever, chills, night sweats or unintended weight loss. Resp: Denies cough, wheezing, shortness of breath. Cardiac: Denies chest pain, palpitations, orthopnea, paroxysmal nocturnal dyspnea. GI: Denies abdominal pain, nausea, vomiting, diarrhea or constipation GU: Denies dysuria, frequency, hesitancy or incontinence MS: Denies swelling Neuro: Denies headache, neurologic deficits (focal weakness, numbness, tingling), abnormal gait Psych: Denies anxiety, depression, SI/HI/AVH Skin: Denies new rashes or lesions ID: Denies sick contacts, exotic exposures, travel Examination:  Constitutional: Not in acute distress Respiratory: Mild bilateral rhonchi Cardiovascular: Normal sinus rhythm, no rubs Abdomen: Nontender nondistended good bowel sounds Musculoskeletal: No edema noted.  Multiple chronic joint deformities in upper and lower extremity Skin: No rashes seen Neurologic: CN 2-12 grossly intact.  And nonfocal Psychiatric: Normal judgment and insight. Alert and oriented x 3. Normal mood.  Objective: Vitals:   02/08/20 0012 02/08/20 0400 02/08/20 0742 02/08/20 0815  BP: 118/80 135/86  (!) 155/76  Pulse: 74 69  83  Resp: 19 18  20   Temp: (!) 97.5 F (36.4 C) (!) 97.2 F (36.2 C)  97.6 F (36.4 C)  TempSrc: Oral Axillary  Oral  SpO2: 98% 99% 95%    Weight:      Height:        Intake/Output Summary (Last 24 hours) at 02/08/2020 0823 Last data filed at 02/08/2020 0200 Gross per 24 hour  Intake 360 ml  Output 1201 ml  Net -841 ml   Filed Weights   02/05/20 1316 02/03/20 0410  Weight: 81.6 kg 72.6 kg     Data Reviewed:   CBC: Recent Labs  Lab 02/02/20 0815 02/06/20 0158 02/07/20 0353 02/08/20 0234  WBC 9.7 13.4* 12.5* 12.9*  NEUTROABS 8.2* 12.2* 11.1*  --   HGB 11.3* 11.2* 12.2* 11.4*  HCT 32.1* 31.9* 36.3* 32.9*  MCV 91.7 91.4 91.2 91.4  PLT 277 183 167 169   Basic Metabolic Panel: Recent Labs  Lab 02/02/20 0815 02/03/20 0912 02/04/20 0206 02/05/20 0143 02/06/20 0158 02/07/20 0353 02/08/20 0234  NA 129*   < > 131* 128* 131* 128* 128*  K 3.8   < > 4.2 4.2 4.2 4.6 4.2  CL 101   < > 102 99 100 99 99  CO2 16*   < > 20* 20* 21* 20* 20*  GLUCOSE 180*   < > 278* 256* 166* 180* 212*  BUN 19   < > 29* 24* 24* 24* 27*  CREATININE 0.86   < > 1.01 0.98 0.78 0.76 0.90  CALCIUM 7.7*   < > 7.8* 7.4* 7.6* 7.6* 7.6*  MG 2.1  --   --   --  2.3 2.3 2.3   < > = values in this interval not displayed.   GFR: Estimated Creatinine Clearance: 62 mL/min (by C-G formula based on SCr of 0.9 mg/dL). Liver Function Tests: Recent Labs  Lab 02/02/20 0815  AST 18  ALT 19  ALKPHOS 37*  BILITOT 0.9  PROT 5.5*  ALBUMIN 2.3*   No results for input(s): LIPASE, AMYLASE in the last 168 hours. No results for input(s): AMMONIA in the last 168 hours. Coagulation Profile: No results for input(s): INR, PROTIME in the last 168 hours. Cardiac Enzymes: No results for input(s): CKTOTAL, CKMB, CKMBINDEX, TROPONINI in the last 168 hours. BNP (last 3 results) No results for input(s): PROBNP in the last 8760 hours. HbA1C: No results for input(s): HGBA1C in the last 72 hours. CBG: Recent Labs  Lab 02/07/20 0833 02/07/20 1303 02/07/20 1640 02/07/20 2059 02/08/20 0808  GLUCAP 162* 203* 211* 239* 220*   Lipid Profile: No results for  input(s): CHOL, HDL, LDLCALC, TRIG, CHOLHDL, LDLDIRECT in the last 72 hours. Thyroid Function Tests: No results for input(s): TSH, T4TOTAL, FREET4, T3FREE, THYROIDAB in the last 72 hours. Anemia Panel: No results for input(s): VITAMINB12, FOLATE, FERRITIN, TIBC, IRON, RETICCTPCT in the last 72 hours. Sepsis Labs: No results for input(s): PROCALCITON, LATICACIDVEN in the last 168 hours.  No results found for this or any previous visit (from the past 240 hour(s)).       Radiology Studies: No results found.      Scheduled Meds: . apixaban  5 mg Oral BID  . vitamin C  500 mg Oral Daily  . calcium carbonate  1,250 mg Oral Q breakfast  . calcium carbonate  0.5  tablet Oral Q supper  . doxazosin  1 mg Oral QHS  . folic acid  1 mg Oral Daily  . hydroxychloroquine  200 mg Oral BID  . insulin aspart  0-20 Units Subcutaneous TID WC  . insulin aspart  0-5 Units Subcutaneous QHS  . insulin aspart  3 Units Subcutaneous TID WC  . methylPREDNISolone (SOLU-MEDROL) injection  40 mg Intravenous Daily  . pantoprazole  40 mg Oral Daily  . pravastatin  80 mg Oral q1800  . vitamin B-12  1,000 mcg Oral Daily  . zinc sulfate  220 mg Oral Daily   Continuous Infusions:   LOS: 12 days   Time spent= 35 mins    Nathan Stallworth Joline Maxcy, MD [facility]  If 7PM-7AM, please contact night-coverage  02/08/2020, 8:23 AM

## 2020-02-08 NOTE — TOC Progression Note (Addendum)
Transition of Care [facility]) - Progression Note    Patient Details  Name: Javier Beard MRN: [id number] Date of Birth: 21-Feb-1940  Transition of Care [facility]) CM/SW Contact  Mearl Latin, LCSW Phone Number: 02/08/2020, 11:28 AM  Clinical Narrative:    11:28am-CSW received call from patient's daughter. She spoke with patient and he would reluctantly be agreeable to Inpatient rehab. CSW explained that CIR will not accept COVID patients but CSW contacted [facility] and [facility] and they are both able to accept patients who are 10 days past first positive test pending bed availability. CSW will send referral to both for review.   2pm-CSW made patient's daughter aware that [facility] can accept patient.    Expected Discharge Plan: Home w Home Health Services Barriers to Discharge: Continued Medical Work up  Expected Discharge Plan and Services Expected Discharge Plan: Home w Home Health Services In-house Referral: Clinical Social Work Discharge Planning Services: CM Consult Post Acute Care Choice: Home Health Living arrangements for the past 2 months: Single Family Home Expected Discharge Date: 02/01/20                                     Social Determinants of Health (SDOH) Interventions    Readmission Risk Interventions No flowsheet data found.

## 2020-02-08 NOTE — Progress Notes (Signed)
Physical Therapy Treatment Patient Details Name: Javier Beard MRN: [id number] DOB: February 02, 1940 Today's Date: 02/08/2020    History of Present Illness Pt is a 80 y.o. male admitted 02/11/2020 with worsening SOB, nausea/vomitting. Workup for acute hypoxic respiratory failure due to COVID-19 PNA. CTA 1/14 negative for PE. PMH includes RA, DM2, afib, HF.   PT Comments    Pt demonstrates significant improved activity tolerance this session, motivated to participate. SpO2 >/91% on RA at rest; down to 85% on 2L O2 [place] with activity. Pt tolerated transfer and gait training trials with SPC and RW. Pt limited by generalized weakness, cardiorespiratory status and impaired balance strategies/postural reactions, requiring modA to prevent LOB with standing activity. Pt would benefit from intensive CIR-level therapies to maximize functional mobility and independence prior to return home; pt and daughter in agreement.    Follow Up Recommendations  CIR;Supervision/Assistance - 24 hour     Equipment Recommendations   (home O2)    Recommendations for Other Services       Precautions / Restrictions Precautions Precautions: Fall;Other (comment) Precaution Comments: watch SpO2 Restrictions Weight Bearing Restrictions: No    Mobility  Bed Mobility               General bed mobility comments: Received sitting in recliner  Transfers Overall transfer level: Needs assistance Equipment used: Straight cane;Rolling walker (2 wheeled) Transfers: Sit to/from Stand Sit to Stand: Mod assist;Min assist         General transfer comment: Initial trial with SPC, pt requiring modA for trunk elevation and stability; performed 4x more sit<>stand to RW with initial minA for trunk elevation, regressing to modA with increased fatigue  Ambulation/Gait Ambulation/Gait assistance: Min assist;Mod assist Gait Distance (Feet): 38 Feet Assistive device: Straight cane;Rolling walker (2 wheeled) Gait  Pattern/deviations: Step-to pattern;Shuffle;Trunk flexed;Staggering left;Staggering right Gait velocity: Decreased Gait velocity interpretation: <1.31 ft/sec, indicative of household ambulator General Gait Details: Initial gait trial with SPC, pt walking 2' with modA to prevent 2x LOB, returning to sit; additional trial with RW, pt walking 48' with minA for stability, required prolonged seated rest secondary to DOE and fatigue; ambulated additional 10' then needing additional seated rest before continued mobility. SpO2 down to 85% on RA   Stairs             Wheelchair Mobility    Modified Rankin (Stroke Patients Only)       Balance Overall balance assessment: Needs assistance Sitting-balance support: Feet supported;Bilateral upper extremity supported Sitting balance-Leahy Scale: Fair       Standing balance-Leahy Scale: Poor Standing balance comment: Reliant on UE support to maintain balance; unable to accept challenge without losing balance                            Cognition Arousal/Alertness: Awake/alert Behavior During Therapy: WFL for tasks assessed/performed Overall Cognitive Status: No family/caregiver present to determine baseline cognitive functioning Area of Impairment: Attention;Safety/judgement;Awareness;Problem solving                   Current Attention Level: Selective   Following Commands: Follows one step commands consistently Safety/Judgement: Decreased awareness of deficits;Decreased awareness of safety Awareness: Emergent Problem Solving: Requires verbal cues General Comments: Improving desire to participate and improving cognition; can hear better now that he is off HHFNC and NRB which is likely helping. When asked about CIR/SNF - pt reports, "I told my daughter, since she's the POA, it's up to her and  y'all"      Exercises Other Exercises Other Exercises: Marching in place with BUE support on RW - tolerated ~15-sec before  needing seated rest due to fatigue/DOE    General Comments General comments (skin integrity, edema, etc.): Pt received on RA with SpO2 91-96% and minimal SOB at rest. SpO2 down to 85% on RA with ambulation; trialled with 2L, still down to 85% with activity (RN aware)      Pertinent Vitals/Pain Pain Assessment: Faces Faces Pain Scale: Hurts little more Pain Location: Bilateral feet, RUE Pain Descriptors / Indicators: Sore Pain Intervention(s): Monitored during session;Limited activity within patient's tolerance    Home Living                      Prior Function            PT Goals (current goals can now be found in the care plan section) Acute Rehab PT Goals Patient Stated Goal: Agreeable to post-acute rehab at [facility] PT Goal Formulation: With patient Time For Goal Achievement: 02/13/20 Progress towards PT goals: Progressing toward goals    Frequency    Min 3X/week      PT Plan Discharge plan needs to be updated    Co-evaluation              AM-PAC PT "6 Clicks" Mobility   Outcome Measure  Help needed turning from your back to your side while in a flat bed without using bedrails?: A Little Help needed moving from lying on your back to sitting on the side of a flat bed without using bedrails?: A Little Help needed moving to and from a bed to a chair (including a wheelchair)?: A Little Help needed standing up from a chair using your arms (e.g., wheelchair or bedside chair)?: A Lot Help needed to walk in hospital room?: A Lot Help needed climbing 3-5 steps with a railing? : A Lot 6 Click Score: 15    End of Session Equipment Utilized During Treatment: Gait belt;Oxygen Activity Tolerance: Patient tolerated treatment well;Patient limited by fatigue Patient left: in chair;with call bell/phone within reach;with nursing/sitter in room Nurse Communication: Mobility status PT Visit Diagnosis: Unsteadiness on feet (R26.81);Other abnormalities of gait and mobility  (R26.89)     Time: 0272-5366 PT Time Calculation (min) (ACUTE ONLY): 30 min  Charges:  $Gait Training: 8-22 mins $Therapeutic Exercise: 8-22 mins                     Ina Homes, PT, DPT Acute Rehabilitation Services  Pager [phone] Office [phone]  Javier Beard 02/08/2020, 1:47 PM

## 2020-02-08 NOTE — Progress Notes (Signed)
SATURATION QUALIFICATIONS: (This note is used to comply with regulatory documentation for home oxygen)  Patient Saturations on Room Air at Rest = 91%  Patient Saturations on Room Air while Ambulating = 85%  Patient Saturations on 2 Liters of oxygen while Ambulating = 85% - did not perform additional trial with increased O2 L due to fatigue post-session  Please briefly explain why patient needs home oxygen: Patient requires supplemental oxygen to maintain SpO2 >/88% with activity. Much improved saturations at rest.  Ina Homes, PT, DPT Acute Rehabilitation Services  Pager [phone] Office [phone]

## 2020-02-09 ENCOUNTER — Inpatient Hospital Stay (HOSPITAL_COMMUNITY): Payer: Medicare Other

## 2020-02-09 DIAGNOSIS — U071 COVID-19: Secondary | ICD-10-CM | POA: Diagnosis not present

## 2020-02-09 DIAGNOSIS — J9601 Acute respiratory failure with hypoxia: Secondary | ICD-10-CM | POA: Diagnosis not present

## 2020-02-09 LAB — BASIC METABOLIC PANEL
Anion gap: 13 (ref 5–15)
BUN: 38 mg/dL — ABNORMAL HIGH (ref 8–23)
CO2: 17 mmol/L — ABNORMAL LOW (ref 22–32)
Calcium: 8 mg/dL — ABNORMAL LOW (ref 8.9–10.3)
Chloride: 102 mmol/L (ref 98–111)
Creatinine, Ser: 1.88 mg/dL — ABNORMAL HIGH (ref 0.61–1.24)
GFR, Estimated: 36 mL/min — ABNORMAL LOW (ref >60)
Glucose, Bld: 76 mg/dL (ref 70–99)
Potassium: 4.4 mmol/L (ref 3.5–5.1)
Sodium: 132 mmol/L — ABNORMAL LOW (ref 135–145)

## 2020-02-09 LAB — BLOOD CULTURE ID PANEL (REFLEXED) - BCID2

## 2020-02-09 LAB — GLUCOSE, CAPILLARY
Glucose-Capillary: 117 mg/dL — ABNORMAL HIGH (ref 70–99)
Glucose-Capillary: 119 mg/dL — ABNORMAL HIGH (ref 70–99)
Glucose-Capillary: 130 mg/dL — ABNORMAL HIGH (ref 70–99)
Glucose-Capillary: 220 mg/dL — ABNORMAL HIGH (ref 70–99)
Glucose-Capillary: 193 mg/dL — ABNORMAL HIGH (ref 70–99)

## 2020-02-09 LAB — CBC
HCT: 38.3 % — ABNORMAL LOW (ref 39.0–52.0)
Hemoglobin: 12.6 g/dL — ABNORMAL LOW (ref 13.0–17.0)
MCH: 30.8 pg (ref 26.0–34.0)
MCHC: 32.9 g/dL (ref 30.0–36.0)
MCV: 93.6 fL (ref 80.0–100.0)
Platelets: 199 10*3/uL (ref 150–400)
RBC: 4.09 MIL/uL — ABNORMAL LOW (ref 4.22–5.81)
RDW: 15.6 % — ABNORMAL HIGH (ref 11.5–15.5)
WBC: 30.4 10*3/uL — ABNORMAL HIGH (ref 4.0–10.5)
nRBC: 0.1 % (ref 0.0–0.2)

## 2020-02-09 LAB — TROPONIN I (HIGH SENSITIVITY)
Troponin I (High Sensitivity): 30 ng/L — ABNORMAL HIGH (ref <18)
Troponin I (High Sensitivity): 29 ng/L — ABNORMAL HIGH (ref <18)

## 2020-02-09 LAB — MAGNESIUM: Magnesium: 2 mg/dL (ref 1.7–2.4)

## 2020-02-09 LAB — MRSA PCR SCREENING: MRSA by PCR: NEGATIVE

## 2020-02-09 LAB — AMMONIA: Ammonia: 30 umol/L (ref 9–35)

## 2020-02-09 LAB — C-REACTIVE PROTEIN: CRP: 8.1 mg/dL — ABNORMAL HIGH (ref <1.0)

## 2020-02-09 LAB — PROCALCITONIN: Procalcitonin: 2.89 ng/mL

## 2020-02-09 LAB — BRAIN NATRIURETIC PEPTIDE: B Natriuretic Peptide: 75.8 pg/mL (ref 0.0–100.0)

## 2020-02-09 IMAGING — CT CT HEAD W/O CM
3 of 6 series · 14 of 47 positions shown, 16 images · non-contrast
Comparison: MRI head [DATE]

CLINICAL DATA: Altered mental status.  COVID positive

EXAM:
CT HEAD WITHOUT CONTRAST
TECHNIQUE: Contiguous axial images were obtained from the base of the skull
through the vertex without intravenous contrast.

[Series 4: head 3.0 mpr cor · coronal · 0.35mm/px · 3 of 78 slices shown]
[im 26/78  brain]
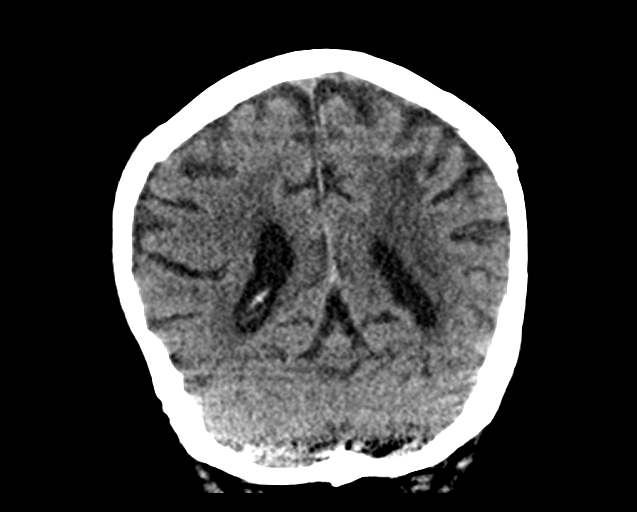
[im 35/78  brain]
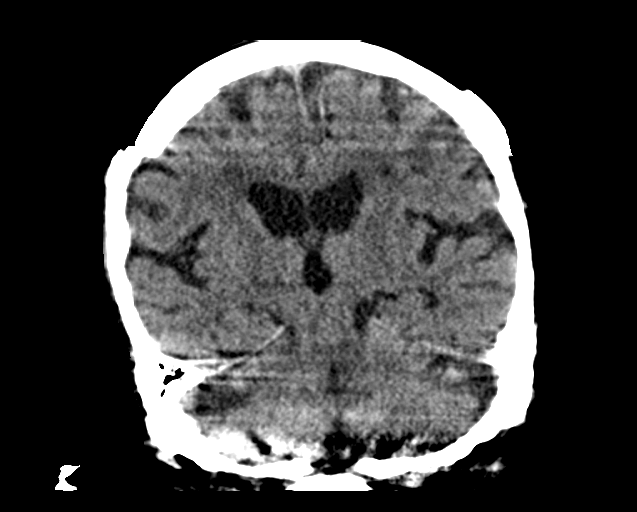
[im 43/78  brain]
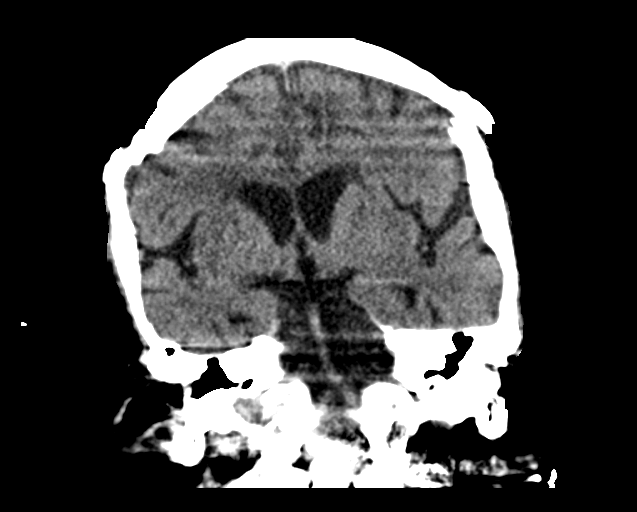

[Series 5: head 3.0 mpr sag · sagittal · 0.31mm/px · 3 of 67 slices shown]
[im 23/67  brain]
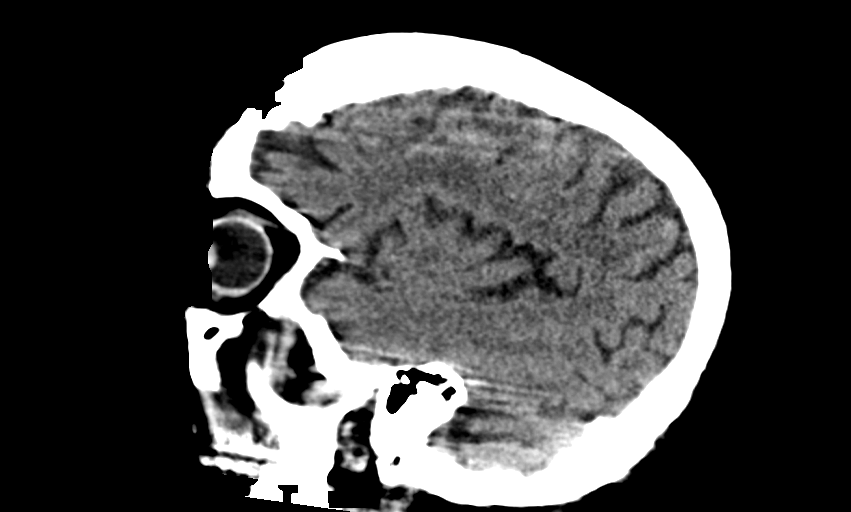
[im 34/67  brain]
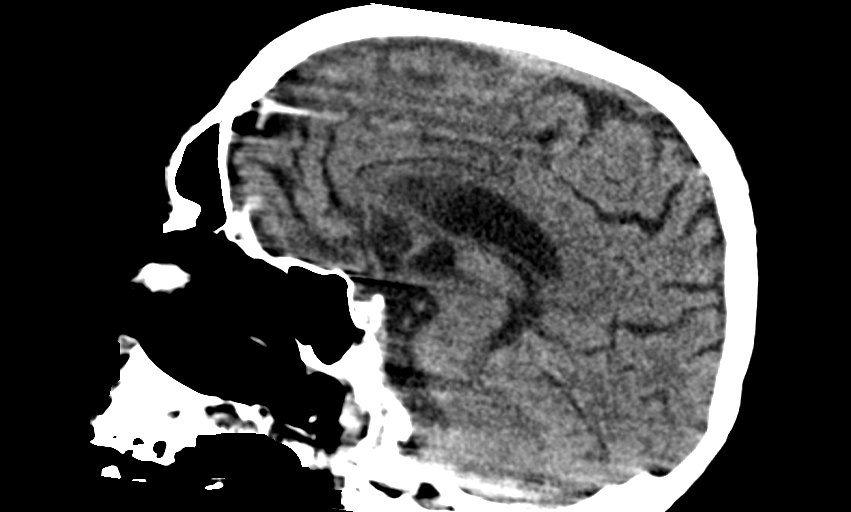
[im 45/67  brain]
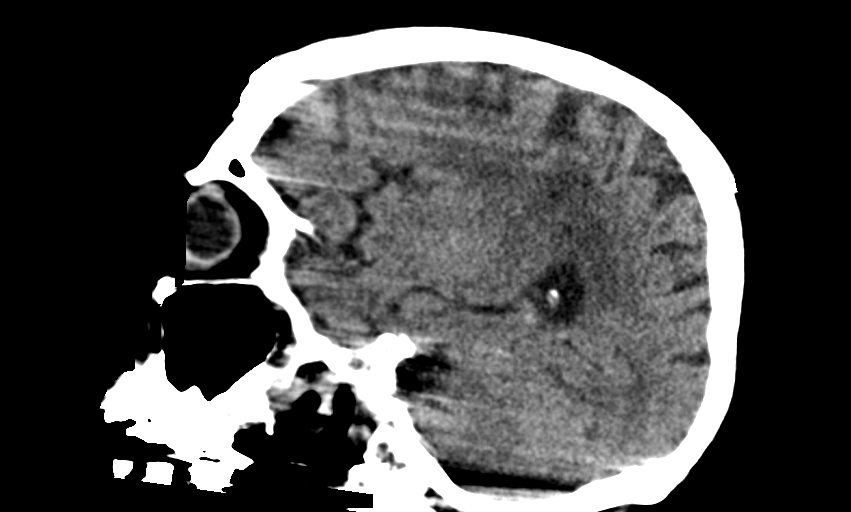

[Series 9: full b · axial · 0.43mm/px · z∈[-138,+2]mm · 8 of 80 slices shown, 10 images]
[im 5/80  brain]
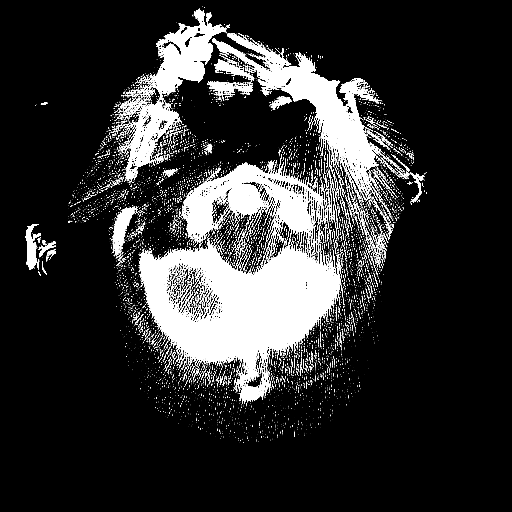
[im 5/80  bone]
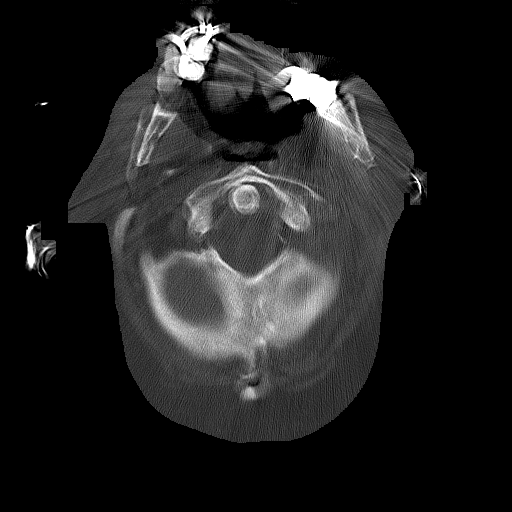
[im 15/80  brain]
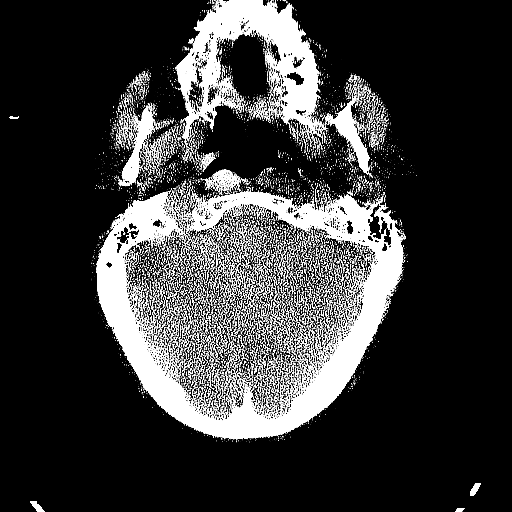
[im 25/80  brain]
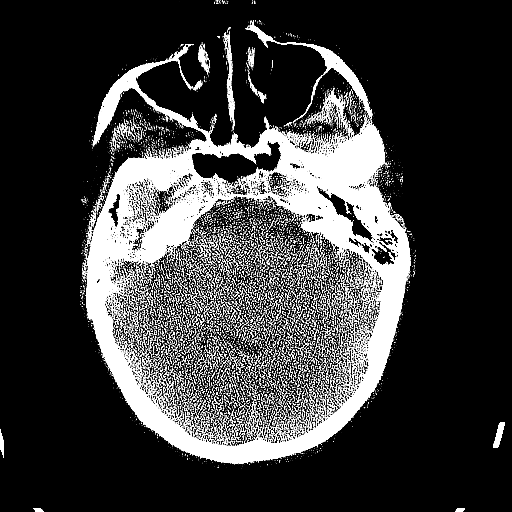
[im 35/80  brain]
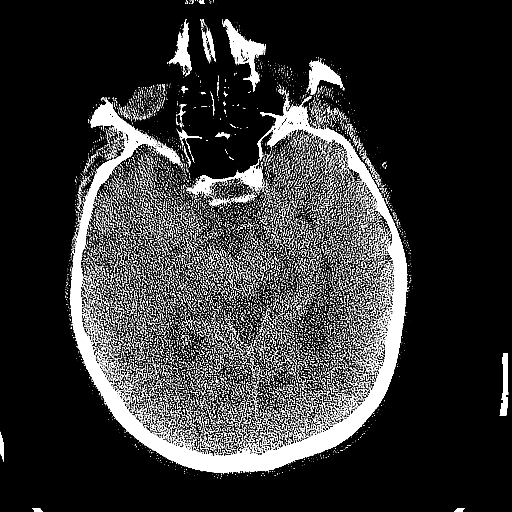
[im 45/80  brain]
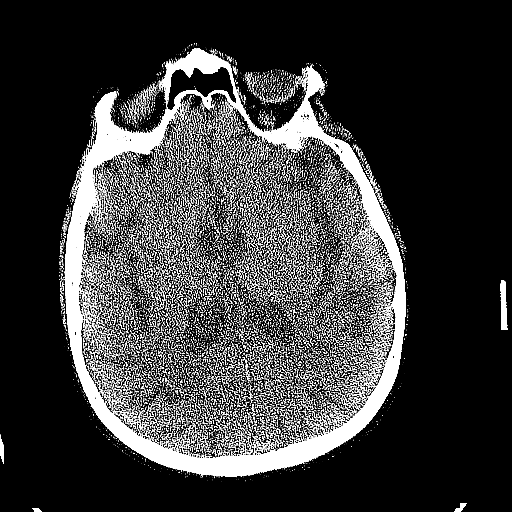
[im 45/80  bone]
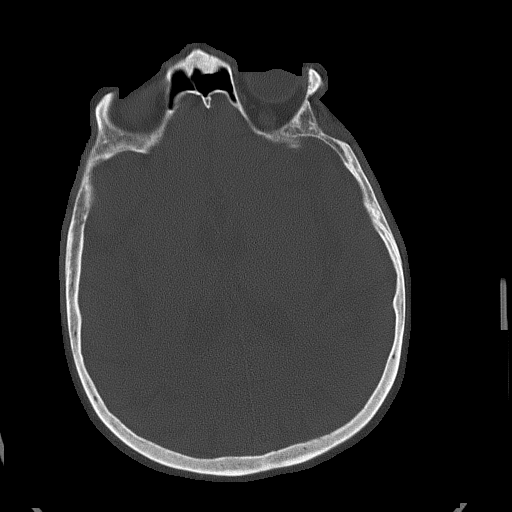
[im 55/80  brain]
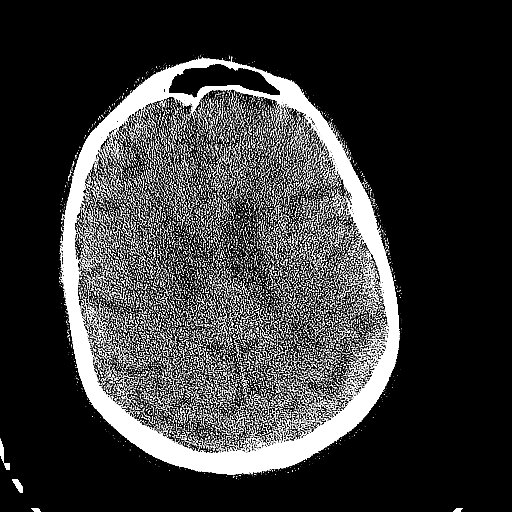
[im 65/80  brain]
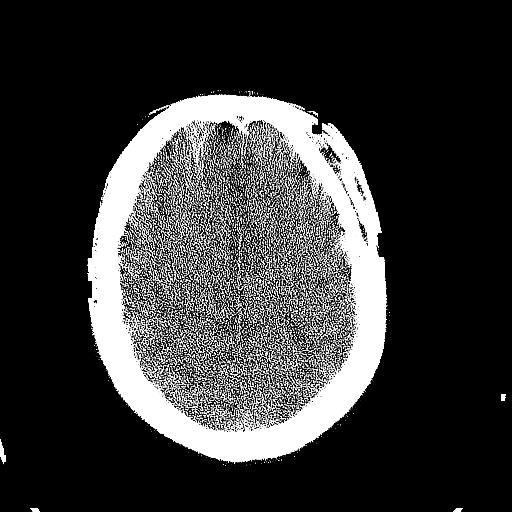
[im 75/80  brain]
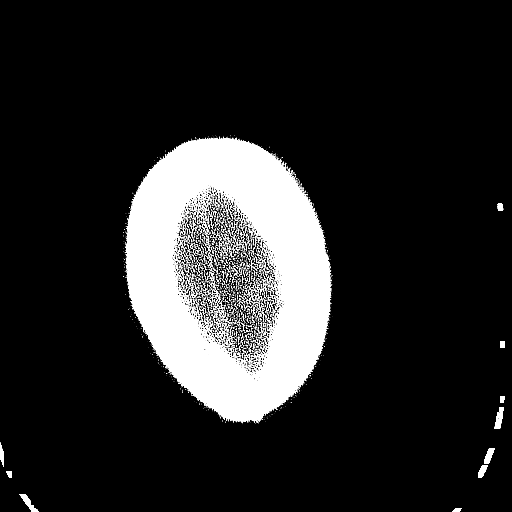

[14 of 47 positions shown; findings below may reference images not displayed]

FINDINGS: Brain: Motion degraded study.

Mild atrophy. Patchy white matter hypodensity bilaterally appears
chronic and similar to the prior MRI.

Negative for acute infarct, acute hemorrhage, or mass lesion.

Vascular: Negative for hyperdense vessel

Skull: Negative

Sinuses/Orbits: Paranasal sinuses clear.  Negative orbit

Other: None
IMPRESSION: Image quality degraded by significant motion.

No acute abnormality identified.

## 2020-02-09 IMAGING — DX DG CHEST 1V PORT
1 series · 1 of 1 positions shown · non-contrast
Comparison: Yesterday

CLINICAL DATA: Dyspnea

EXAM:
PORTABLE CHEST 1 VIEW

[chest ap]
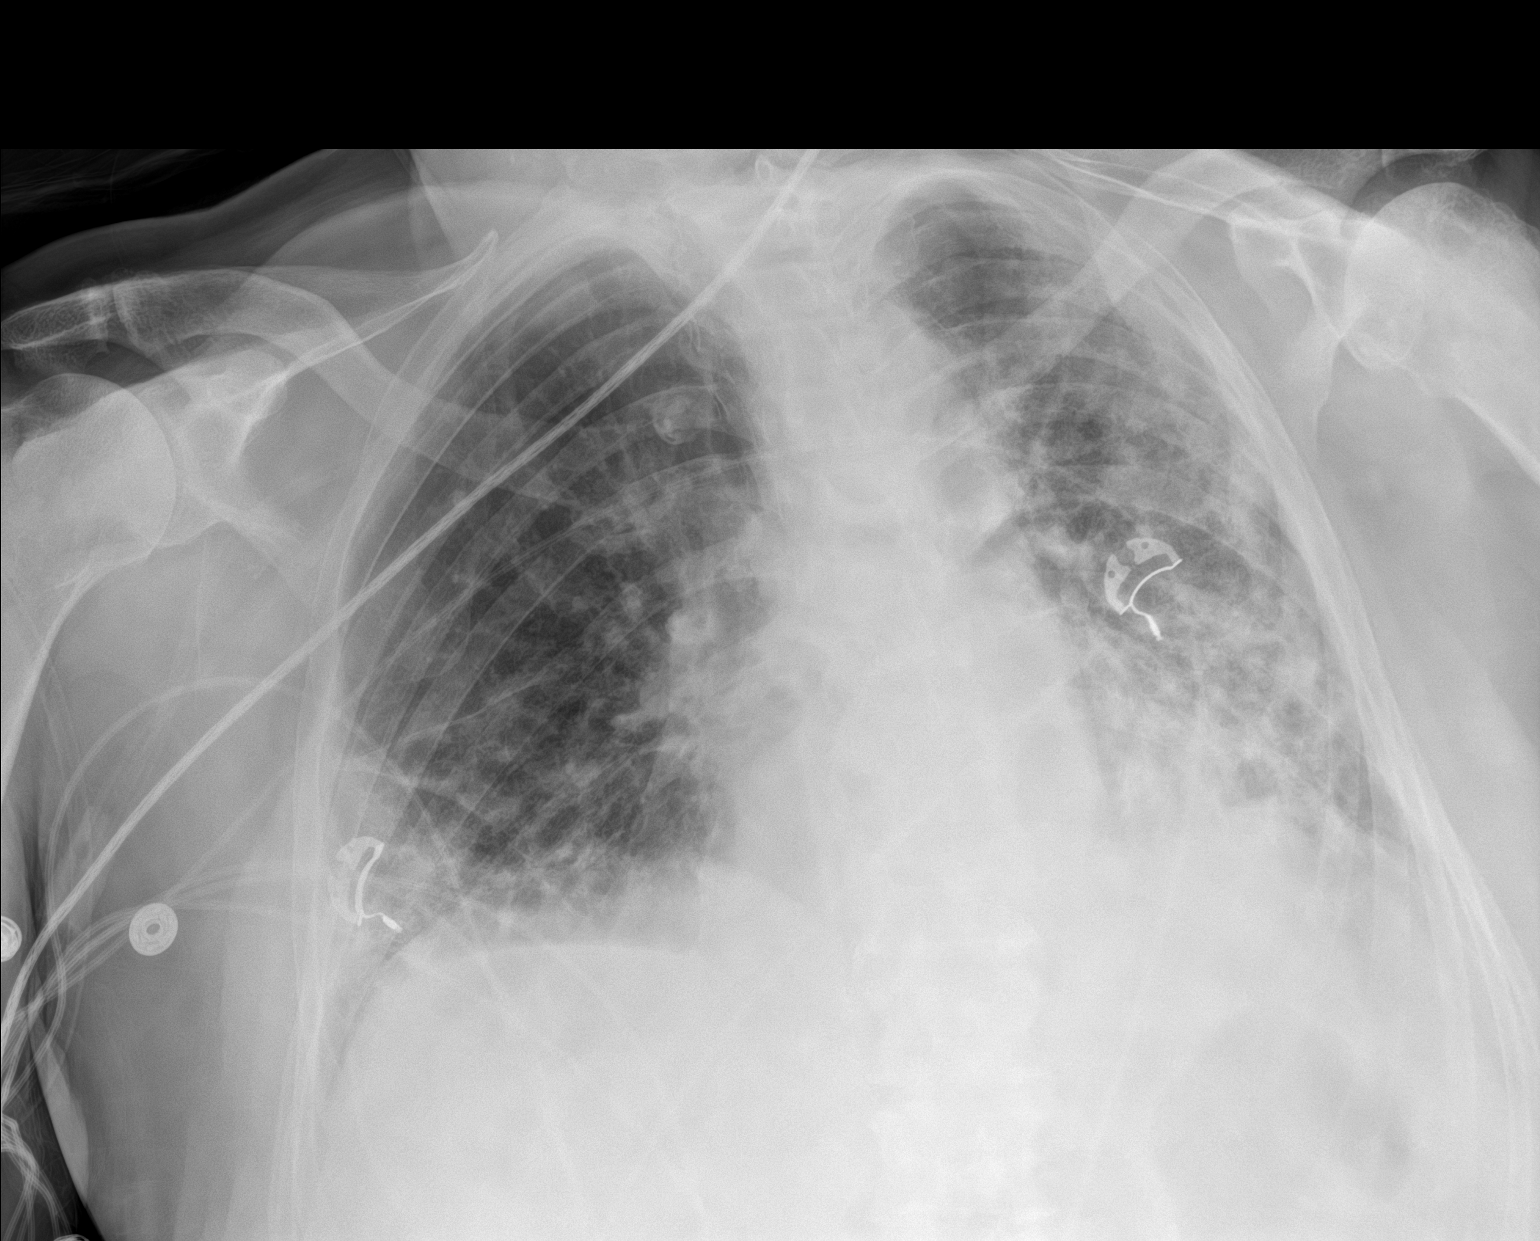

[1 of 1 positions shown; findings below may reference images not displayed]

FINDINGS: Bilateral pneumonia which is worse on the left where there is less
inflation. Cardiomegaly with hiatal hernia by CT. No visible
effusion or pneumothorax.
IMPRESSION: Unchanged bilateral pneumonia which is asymmetric to the left.

## 2020-02-09 MED ORDER — ORAL CARE MOUTH RINSE
15.0000 mL | Freq: Two times a day (BID) | OROMUCOSAL | Status: DC
  Administered 2020-02-09 – 2020-02-21 (×20): 15 mL via OROMUCOSAL

## 2020-02-09 MED ORDER — NOREPINEPHRINE 4 MG/250ML-% IV SOLN
INTRAVENOUS | Status: AC
  Filled 2020-02-09: qty 250

## 2020-02-09 MED ORDER — LACTATED RINGERS IV BOLUS
500.0000 mL | Freq: Once | INTRAVENOUS | Status: AC
  Administered 2020-02-09: 500 mL via INTRAVENOUS

## 2020-02-09 MED ORDER — CEFAZOLIN SODIUM-DEXTROSE 2-4 GM/100ML-% IV SOLN
2.0000 g | Freq: Three times a day (TID) | INTRAVENOUS | Status: DC
  Administered 2020-02-10 – 2020-02-13 (×11): 2 g via INTRAVENOUS
  Filled 2020-02-09 (×12): qty 100

## 2020-02-09 MED ORDER — LACTATED RINGERS IV BOLUS
1000.0000 mL | Freq: Once | INTRAVENOUS | Status: AC
  Administered 2020-02-09: 1000 mL via INTRAVENOUS

## 2020-02-09 MED ORDER — SODIUM CHLORIDE 0.9 % IV SOLN
250.0000 mL | INTRAVENOUS | Status: DC
  Administered 2020-02-09: 250 mL via INTRAVENOUS

## 2020-02-09 MED ORDER — LINEZOLID 600 MG/300ML IV SOLN
600.0000 mg | Freq: Two times a day (BID) | INTRAVENOUS | Status: DC
  Administered 2020-02-09 (×2): 600 mg via INTRAVENOUS
  Filled 2020-02-09 (×3): qty 300

## 2020-02-09 MED ORDER — SODIUM CHLORIDE 0.9 % IV SOLN: INTRAVENOUS | Status: DC

## 2020-02-09 MED ORDER — SODIUM CHLORIDE 0.9 % IV BOLUS
500.0000 mL | Freq: Once | INTRAVENOUS | Status: AC
  Administered 2020-02-09: 500 mL via INTRAVENOUS

## 2020-02-09 MED ORDER — CHLORHEXIDINE GLUCONATE CLOTH 2 % EX PADS
6.0000 | MEDICATED_PAD | Freq: Every day | CUTANEOUS | Status: DC
  Administered 2020-02-09 – 2020-02-22 (×10): 6 via TOPICAL

## 2020-02-09 MED ORDER — MORPHINE SULFATE (PF) 2 MG/ML IV SOLN
2.0000 mg | INTRAVENOUS | Status: DC | PRN
  Administered 2020-02-09 – 2020-02-10 (×4): 2 mg via INTRAVENOUS
  Filled 2020-02-09 (×5): qty 1

## 2020-02-09 MED ORDER — NOREPINEPHRINE 4 MG/250ML-% IV SOLN
2.0000 ug/min | INTRAVENOUS | Status: DC
  Administered 2020-02-09: 10 ug/min via INTRAVENOUS
  Filled 2020-02-09 (×3): qty 250

## 2020-02-09 MED ORDER — NOREPINEPHRINE 4 MG/250ML-% IV SOLN
0.0000 ug/min | INTRAVENOUS | Status: DC
Start: 2020-02-09 — End: 2020-02-09
  Administered 2020-02-09: 5 ug/min via INTRAVENOUS

## 2020-02-09 MED ORDER — GLIPIZIDE 5 MG PO TABS
2.5000 mg | ORAL_TABLET | Freq: Every day | ORAL | Status: DC
  Administered 2020-02-10 – 2020-02-13 (×4): 2.5 mg via ORAL
  Filled 2020-02-09 (×2): qty 0.5
  Filled 2020-02-09: qty 1
  Filled 2020-02-09 (×3): qty 0.5

## 2020-02-09 MED ORDER — SODIUM CHLORIDE 0.9 % IV SOLN
1.0000 g | Freq: Two times a day (BID) | INTRAVENOUS | Status: DC
  Administered 2020-02-09 (×2): 1 g via INTRAVENOUS
  Filled 2020-02-09 (×6): qty 1

## 2020-02-09 NOTE — Progress Notes (Signed)
At the shift changed, patient lethargic, on 15 L oxygen HFNC with sat O2 90%, respiration labored, HR in high 120s and low BP (78/49, MAP 56). Patient was responding to voice. Per MD patient received a bolus of 500 ml and bladder scan showed 28 ml. After 30 minutes sat O2 dropped in low 80s and patient was placed on NRB along with HFNC 15 L. MD made aware and also, Rapid Response was called. STAT CXR and ABG ordered. Per MD order patient was transferred to ICU.

## 2020-02-09 NOTE — Progress Notes (Signed)
°   02/08/20 2004  Assess: MEWS Score  Temp 98.3 F (36.8 C)  BP 98/74  ECG Heart Rate (!) 102  Resp (!) 33  Level of Consciousness Alert  SpO2 93 %  O2 Device HFNC  O2 Flow Rate (L/min) 6 L/min  Assess: MEWS Score  MEWS Temp 0  MEWS Systolic 1  MEWS Pulse 1  MEWS RR 2  MEWS LOC 0  MEWS Score 4  MEWS Score Color Red  Assess: if the MEWS score is Yellow or Red  Were vital signs taken at a resting state? Yes  Focused Assessment No change from prior assessment  Early Detection of Sepsis Score *See Row Information* Medium  MEWS guidelines implemented *See Row Information* Yes  Treat  MEWS Interventions Administered prn meds/treatments  Pain Scale 0-10  Pain Score 10  Pain Type Acute pain  Pain Location Back  Pain Orientation Left;Upper  Pain Descriptors / Indicators Sharp  Pain Frequency Intermittent  Pain Onset Sudden  Patients Stated Pain Goal 0  Pain Intervention(s) MD notified (Comment)  Take Vital Signs  Increase Vital Sign Frequency  Red: Q 1hr X 4 then Q 4hr X 4, if remains red, continue Q 4hrs  Escalate  MEWS: Escalate Red: discuss with charge nurse/RN and provider, consider discussing with RRT  Notify: Charge Nurse/RN  Name of Charge Nurse/RN Notified Joni Reining  Date Charge Nurse/RN Notified 02/08/20  Time Charge Nurse/RN Notified 2004  Notify: Provider  Provider Name/Title Zierle-Ghosh  Date Provider Notified 02/08/20  Time Provider Notified 2008  Notification Type Page  Notification Reason  (10/10 L shoulder pain)  Document  Patient Outcome Other (Comment) (continued monitoring; see provider orders)  Progress note created (see row info) Yes

## 2020-02-09 NOTE — Progress Notes (Signed)
RT attempted to get ABG x 3 attempts using doppler but was unsuccessful. MD made aware.

## 2020-02-09 NOTE — Significant Event (Addendum)
Responded to rapid response - 80 year old COVID ARDS had been improving. Decompensated overnight with new O2 requirement, lethargy. CT head clear. Worsening hypoxemia, WOB this morning. On NRB with accessory muscle use. New hypotension. CXR on my interpretation with persistent and worsening L sided infiltrates. CXR with low bicarb new renal failure CR doubled overnight. Transferred to 51M.  Spoke to daughter Philipp Deputy who is reasonably available next of kin. Discussed nature of decompensation. Multi-system organ failure. Worsening CXR. 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 Concern for new HCAP as well given hypotension. Given age, co-morbidities, severity of current illness, made recommendation of DNR. She states he would not want to be put on a ventilator so she agrees. Over course of next few minutes he was observed to be hypotensive on peripheral pressors. Breathing 40 times a minute. Spoke with Tammy again and made her aware that I thought was likely patient will pass and good possibility of today. [place] Spaniel would be reasonable to pursue comfort care - prioritizing his comfort, minimizing symptoms acknowledging this would mean he would die but he would die peacefully, comfortably. She will call sister to discuss and let us know.  Called back and wishes for continued aggressive care and re-assess in the afternoon. Will re-assess this afternoon.

## 2020-02-09 NOTE — TOC Progression Note (Signed)
Transition of Care [facility]) - Progression Note    Patient Details  Name: Javier Beard MRN: [id number] Date of Birth: 02-Feb-1940  Transition of Care [facility]) CM/SW Contact  Mearl Latin, LCSW Phone Number: 02/09/2020, 9:08 AM  Clinical Narrative:    CSW notified that patient is now in ICU. CSW made [employer] and [facility] aware.    Expected Discharge Plan: Home w Home Health Services Barriers to Discharge: Continued Medical Work up  Expected Discharge Plan and Services Expected Discharge Plan: Home w Home Health Services In-house Referral: Clinical Social Work Discharge Planning Services: CM Consult Post Acute Care Choice: Home Health Living arrangements for the past 2 months: Single Family Home Expected Discharge Date: 02/01/20                                     Social Determinants of Health (SDOH) Interventions    Readmission Risk Interventions No flowsheet data found.

## 2020-02-09 NOTE — Progress Notes (Signed)
Rapid response called this morning as patient was obtunded and hypoxic.  Overnight he had change in mental status with hypotension.  CT head was performed which was negative, his oxygen was increased.  Chest x-ray showed worsening of left-sided infiltrate. This morning his condition progressed therefore rapid response was called and ICU team was notified.  Patient was transferred to the ICU for further management.  Overall patient rapidly declined over the last 12 hours due to progression of his respiratory disease/COVID-19.  There is concerns of possible mucous plugging.  He is already on anticoagulation.  Appreciate assistance from Critical care team, Dr Jerral Ralph and the nursing staff.   Transferred to the ICU.   Call with further questions as needed.   Stephania Fragmin MD [facility]

## 2020-02-09 NOTE — Progress Notes (Addendum)
NAME:  Javier Beard, MRN:  [id number], DOB:  February 09, 1940, LOS: 13 ADMISSION DATE:  Feb 10, 2020, CONSULTATION DATE:  02/09/20 REFERRING MD:  Dr. Loney Loh, CHIEF COMPLAINT:  Worsening dyspnea   Brief History:  80 y.o. 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  PCCM consulted for possible bipap  History of Present Illness:  Javier Beard is a 80 y.o. 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   Early morning hours of 1/12 Pt became acutely more dyspneic and had increasing oxygen requirements from 3L to 15L and non-rebreather.  He has not had increasing leukocytosis and no known HF.   ABG 7.4/26/188/19.   PCCM consulted for possible Bipap and ICU transfer. Pulmonary critical care called back 02/09/2020 for acute decompensation after 13 days of treatment of COVID.  Noted to have worsening renal failure, worsening mental status, and hypotension.  Pulmonary critical care at bedside at the request of rapid response team.  Transferred to the medical intensive care unit.  Family has been contacted for possible limited CODE BLUE status to avoid intubation if possible.  Past Medical History:   has a past medical history of Arthritis, Atrial fibrillation (HCC), Coronary artery disease, DM type 2 (diabetes mellitus, type 2) (HCC), Hypertension, and Rheumatoid arteritis (HCC).   Significant Hospital Events:  1/8 Admitted 1/12 PCCM consult, transfer to progressive   Consults:  PCCM  Procedures:    Significant Diagnostic Tests:  1/12 CXR>>Increasing heterogeneous opacities throughout both lungs,  concerning for worsening infection in the setting of COVID-19  Micro Data:    Antimicrobials:   Remdesevir 1/7-1/11 02/09/2020 Zyvox 02/09/2020 cefepime  Interim History / Subjective:  Acute distress transfer intensive care unit  Objective   Blood pressure (!) 78/49, pulse (!) 117, temperature 99.4 F (37.4 C), temperature source Oral, resp. rate (!) 40, height 5\' 6"  (1.676 m), weight 72.6 kg, SpO2 93 %.    FiO2 (%):  [100 %] 100 %   Intake/Output Summary (Last 24 hours) at 02/09/2020 0848 Last data filed at 02/09/2020 0200 Gross per 24 hour  Intake 1220 ml  Output 400 ml  Net 820 ml   Filed Weights   2020/02/10 1316 02/03/20 0410  Weight: 81.6 kg 72.6 kg    General: Frail elderly male with obvious change in mental status rapid respiratory rate HEENT: Oral mucosa is extremely dry, no JVD is appreciated.  And dentulous Neuro: Creased level of consciousness but does track and nod CV: Heart sounds are distant PULM: Poor air movement 96% 100% nonrebreather  GI: soft, bsx4 active  Extremities: warm/dry, 1 edema  Skin: no rashes or lesions    Resolved Hospital Problem list     Assessment & Plan:   Acute decompensation 80 2022-day 13 of COVID admission.  He was evaluated with pulmonary critical care on 02/01/2020 and was not in acute distress at that time pulmonary critical care signed off. 02/09/2020 acute shock, acute hypoxemia, worsening renal failure, his level of consciousness.  Seen by pulmonary critical care at the request of rapid response transferred to the intensive care unit Transfer to intensive care unit Continue treatment for COVID Empirical treatment for  aspiration Vasopressor support as needed Serial cardiac enzymes rule out cardiac event Currently on Eliquis therefore pulmonary embolism is doubted Address goals of care with family.   Shock of questionable origin Vasopressor support  Chronic atrial fibrillation Continue  anticoagulation   COVID-positive Continue current treatment  Suspected aspiration Empirical antimicrobial therapy day 0 of X  Worsening renal failure Lab Results  Component Value Date   CREATININE 1.88 (H) 02/09/2020   CREATININE 0.90 02/08/2020   CREATININE 0.76 02/07/2020   Hydration Avoid nephrotoxic  Rule out MI CE  Best practice (evaluated daily)  Diet: npo Pain/Anxiety/Delirium protocol (if indicated): n/a VAP protocol (if indicated): n/a DVT prophylaxis: eliquis GI prophylaxis: n/a Glucose control:ssi Mobility: bed rest Disposition:icu  Goals of Care:  Last date of multidisciplinary goals of care discussion: Family and staff present:  Summary of discussion:  Follow up goals of care discussion due:  Code Status: full code  Labs   CBC: Recent Labs  Lab 02/06/20 0158 02/07/20 0353 02/08/20 0234 02/09/20 0201  WBC 13.4* 12.5* 12.9* 30.4*  NEUTROABS 12.2* 11.1*  --   --   HGB 11.2* 12.2* 11.4* 12.6*  HCT 31.9* 36.3* 32.9* 38.3*  MCV 91.4 91.2 91.4 93.6  PLT 183 167 169 199    Basic Metabolic Panel: Recent Labs  Lab 02/05/20 0143 02/06/20 0158 02/07/20 0353 02/08/20 0234 02/09/20 0201  NA 128* 131* 128* 128* 132*  K 4.2 4.2 4.6 4.2 4.4  CL 99 100 99 99 102  CO2 20* 21* 20* 20* 17*  GLUCOSE 256* 166* 180* 212* 76  BUN 24* 24* 24* 27* 38*  CREATININE 0.98 0.78 0.76 0.90 1.88*  CALCIUM 7.4* 7.6* 7.6* 7.6* 8.0*  MG  --  2.3 2.3 2.3 2.0   GFR: Estimated Creatinine Clearance: 29.7 mL/min (A) (by C-G formula based on SCr of 1.88 mg/dL (H)). Recent Labs  Lab 02/06/20 0158 02/07/20 0353 02/08/20 0234 02/09/20 0201  WBC 13.4* 12.5* 12.9* 30.4*    Liver Function Tests: No results for input(s): AST, ALT, ALKPHOS, BILITOT, PROT, ALBUMIN in the last 168 hours. No results for input(s): LIPASE, AMYLASE in the last 168 hours. No results for input(s): AMMONIA in the last 168 hours.  ABG    Component Value Date/Time   PHART 7.483 (H)  01/31/2020 0350   PCO2ART 26.2 (L) 01/31/2020 0350   PO2ART 188 (H) 01/31/2020 0350   HCO3 18.8 (L) 02/08/2020 2133   ACIDBASEDEF 5.2 (H) 02/08/2020 2133   O2SAT 86.2 02/08/2020 2133     Coagulation Profile: No results for input(s): INR, PROTIME in the last 168 hours.  Cardiac Enzymes: No results for input(s): CKTOTAL, CKMB, CKMBINDEX, TROPONINI in the last 168 hours.  HbA1C: No results found for: HGBA1C  CBG: Recent Labs  Lab 02/08/20 1250 02/08/20 1739 02/08/20 2009 02/09/20 0726 02/09/20 0825  GLUCAP 101* 221* 146* 117* 119*      Steve Neelah Mannings ACNP Acute Care Nurse Practitioner [facility] Pul[facility]/Critical Care Please consult Amion 02/09/2020, 8:49 AM

## 2020-02-09 NOTE — Progress Notes (Signed)
Placed pt on hhfnc at this time to help wit increased wob

## 2020-02-09 NOTE — Significant Event (Signed)
Rapid Response Event Note   Reason for Call :  Acute decline/distress  Initial Focused Assessment:  Patient lying in the bed in respiratory distress using accessory muscles.  He is responsive to touch, but only moans. Diaphoretic   BP 78/49  83/58  HR 120s  RR 38-42  O2 sat 95% on NRB and 15L [place]  Lung sounds with crackles   CBG 119  Interventions:  NS bolus PCXR CCM at bedside to assess patient  Transported to 3M09 20 ga PIV placed  Plan of Care:  Levophed gtt  LR bolus  Event Summary:   MD Notified: Dr Nelson Chimes notified by RN,  Dr Jerral Ralph at bedside Call Time: 0805 Arrival Time: 0809 End Time:  0915  Marcellina Millin, RN

## 2020-02-09 NOTE — Progress Notes (Signed)
PT Cancellation Note  Patient Details Name: Javier Beard MRN: [id number] DOB: 1940/12/23   Cancelled Treatment:    Reason Eval/Treat Not Completed: Medical issues which prohibited therapy. Transfer to ICU due to acute decompensation with worsening renal failure, AMS, hypotension. Will hold PT treatment today and check back on Monday.   Ina Homes, PT, DPT Acute Rehabilitation Services  Pager [phone] Office [phone]  Javier Beard 02/09/2020, 9:25 AM

## 2020-02-09 NOTE — Progress Notes (Signed)
Early in the night - patient had an increase in O2 requirement. VBG, D-Dimer, trop, EKG, and CXR are all reassuring. Patient has been tachy with soft Bps. I evaluated pt at bedside. He is oriented x 1 (different from his baseline of AOx3 per nurse). He feels warm to the touch, but axillary temp has been less than 100.  He reports that he has had tylenol on day shift and it helped him symptomatically. RN and I discussed giving his PRN tylenol. Patient's speech had waxing and waning slur. He did follow commands for neuro exam, and there were not any other acute deficits. CT head stat ordered. Will continue to follow.

## 2020-02-09 NOTE — Progress Notes (Signed)
PHARMACY - PHYSICIAN COMMUNICATION CRITICAL VALUE ALERT - BLOOD CULTURE IDENTIFICATION (BCID)  Javier Beard is an 80 y.o. male who presented to [facility] on 02/05/20 with a chief complaint of COVID  Assessment:  Pt growing MSSA in 3/3 blood cultures - likely source PNA.  Name of physician (or Provider) Contacted: Dr. Warrick Parisian  Current antibiotics: Cefepime and Linezolid  Changes to prescribed antibiotics recommended:  D/c Cefepime and Linezolid Cefazolin 2gm IV q8h ID will automatically be consulted  Results for orders placed or performed during the hospital encounter of 02/05/2020  Blood Culture ID Panel (Reflexed) (Collected: 02/09/2020  8:22 AM)  Result Value Ref Range   Enterococcus faecalis NOT DETECTED NOT DETECTED   Enterococcus Faecium NOT DETECTED NOT DETECTED   Listeria monocytogenes NOT DETECTED NOT DETECTED   Staphylococcus species DETECTED (A) NOT DETECTED   Staphylococcus aureus (BCID) DETECTED (A) NOT DETECTED   Staphylococcus epidermidis NOT DETECTED NOT DETECTED   Staphylococcus lugdunensis NOT DETECTED NOT DETECTED   Streptococcus species NOT DETECTED NOT DETECTED   Streptococcus agalactiae NOT DETECTED NOT DETECTED   Streptococcus pneumoniae NOT DETECTED NOT DETECTED   Streptococcus pyogenes NOT DETECTED NOT DETECTED   A.calcoaceticus-baumannii NOT DETECTED NOT DETECTED   Bacteroides fragilis NOT DETECTED NOT DETECTED   Enterobacterales NOT DETECTED NOT DETECTED   Enterobacter cloacae complex NOT DETECTED NOT DETECTED   Escherichia coli NOT DETECTED NOT DETECTED   Klebsiella aerogenes NOT DETECTED NOT DETECTED   Klebsiella oxytoca NOT DETECTED NOT DETECTED   Klebsiella pneumoniae NOT DETECTED NOT DETECTED   Proteus species NOT DETECTED NOT DETECTED   Salmonella species NOT DETECTED NOT DETECTED   Serratia marcescens NOT DETECTED NOT DETECTED   Haemophilus influenzae NOT DETECTED NOT DETECTED   Neisseria meningitidis NOT DETECTED NOT DETECTED   Pseudomonas  aeruginosa NOT DETECTED NOT DETECTED   Stenotrophomonas maltophilia NOT DETECTED NOT DETECTED   Candida albicans NOT DETECTED NOT DETECTED   Candida auris NOT DETECTED NOT DETECTED   Candida glabrata NOT DETECTED NOT DETECTED   Candida krusei NOT DETECTED NOT DETECTED   Candida parapsilosis NOT DETECTED NOT DETECTED   Candida tropicalis NOT DETECTED NOT DETECTED   Cryptococcus neoformans/gattii NOT DETECTED NOT DETECTED   Meth resistant mecA/C and MREJ NOT DETECTED NOT DETECTED    Christoper Fabian, PharmD, BCPS Please see amion for complete clinical pharmacist phone list 02/09/2020  10:33 PM

## 2020-02-10 ENCOUNTER — Inpatient Hospital Stay (HOSPITAL_COMMUNITY): Payer: Medicare Other

## 2020-02-10 DIAGNOSIS — M542 Cervicalgia: Secondary | ICD-10-CM

## 2020-02-10 DIAGNOSIS — J9601 Acute respiratory failure with hypoxia: Secondary | ICD-10-CM | POA: Diagnosis not present

## 2020-02-10 DIAGNOSIS — B9561 Methicillin susceptible Staphylococcus aureus infection as the cause of diseases classified elsewhere: Secondary | ICD-10-CM

## 2020-02-10 DIAGNOSIS — I480 Paroxysmal atrial fibrillation: Secondary | ICD-10-CM | POA: Diagnosis not present

## 2020-02-10 DIAGNOSIS — M546 Pain in thoracic spine: Secondary | ICD-10-CM

## 2020-02-10 DIAGNOSIS — I2510 Atherosclerotic heart disease of native coronary artery without angina pectoris: Secondary | ICD-10-CM

## 2020-02-10 DIAGNOSIS — R000 Tachycardia, unspecified: Secondary | ICD-10-CM

## 2020-02-10 DIAGNOSIS — R7881 Bacteremia: Secondary | ICD-10-CM

## 2020-02-10 DIAGNOSIS — Z66 Do not resuscitate: Secondary | ICD-10-CM

## 2020-02-10 DIAGNOSIS — J1282 Pneumonia due to coronavirus disease 2019: Secondary | ICD-10-CM | POA: Diagnosis not present

## 2020-02-10 DIAGNOSIS — U071 COVID-19: Secondary | ICD-10-CM | POA: Diagnosis not present

## 2020-02-10 LAB — GLUCOSE, CAPILLARY
Glucose-Capillary: 165 mg/dL — ABNORMAL HIGH (ref 70–99)
Glucose-Capillary: 153 mg/dL — ABNORMAL HIGH (ref 70–99)
Glucose-Capillary: 138 mg/dL — ABNORMAL HIGH (ref 70–99)
Glucose-Capillary: 115 mg/dL — ABNORMAL HIGH (ref 70–99)

## 2020-02-10 LAB — CBC
HCT: 34.9 % — ABNORMAL LOW (ref 39.0–52.0)
Hemoglobin: 11.6 g/dL — ABNORMAL LOW (ref 13.0–17.0)
MCH: 32.4 pg (ref 26.0–34.0)
MCHC: 33.2 g/dL (ref 30.0–36.0)
MCV: 97.5 fL (ref 80.0–100.0)
Platelets: UNDETERMINED 10*3/uL (ref 150–400)
RBC: 3.58 MIL/uL — ABNORMAL LOW (ref 4.22–5.81)
RDW: 15.9 % — ABNORMAL HIGH (ref 11.5–15.5)
WBC: 24.3 10*3/uL — ABNORMAL HIGH (ref 4.0–10.5)
nRBC: 0 % (ref 0.0–0.2)

## 2020-02-10 LAB — BASIC METABOLIC PANEL
Anion gap: 14 (ref 5–15)
BUN: 31 mg/dL — ABNORMAL HIGH (ref 8–23)
CO2: 15 mmol/L — ABNORMAL LOW (ref 22–32)
Calcium: 7.5 mg/dL — ABNORMAL LOW (ref 8.9–10.3)
Chloride: 106 mmol/L (ref 98–111)
Creatinine, Ser: 1.32 mg/dL — ABNORMAL HIGH (ref 0.61–1.24)
GFR, Estimated: 56 mL/min — ABNORMAL LOW (ref >60)
Glucose, Bld: 208 mg/dL — ABNORMAL HIGH (ref 70–99)
Potassium: 5.2 mmol/L — ABNORMAL HIGH (ref 3.5–5.1)
Sodium: 135 mmol/L (ref 135–145)

## 2020-02-10 LAB — BRAIN NATRIURETIC PEPTIDE: B Natriuretic Peptide: 257.4 pg/mL — ABNORMAL HIGH (ref 0.0–100.0)

## 2020-02-10 LAB — MAGNESIUM: Magnesium: 2.1 mg/dL (ref 1.7–2.4)

## 2020-02-10 LAB — PHOSPHORUS: Phosphorus: 4 mg/dL (ref 2.5–4.6)

## 2020-02-10 LAB — C-REACTIVE PROTEIN: CRP: 28 mg/dL — ABNORMAL HIGH (ref <1.0)

## 2020-02-10 IMAGING — DX DG CHEST 1V PORT
1 series · 2 of 2 positions shown · non-contrast
Comparison: Yesterday

CLINICAL DATA: Abnormal respiration

EXAM:
PORTABLE CHEST 1 VIEW

[Series 1: chest · 0.14mm/px · 2 of 2 slices shown]
[im 1/2]
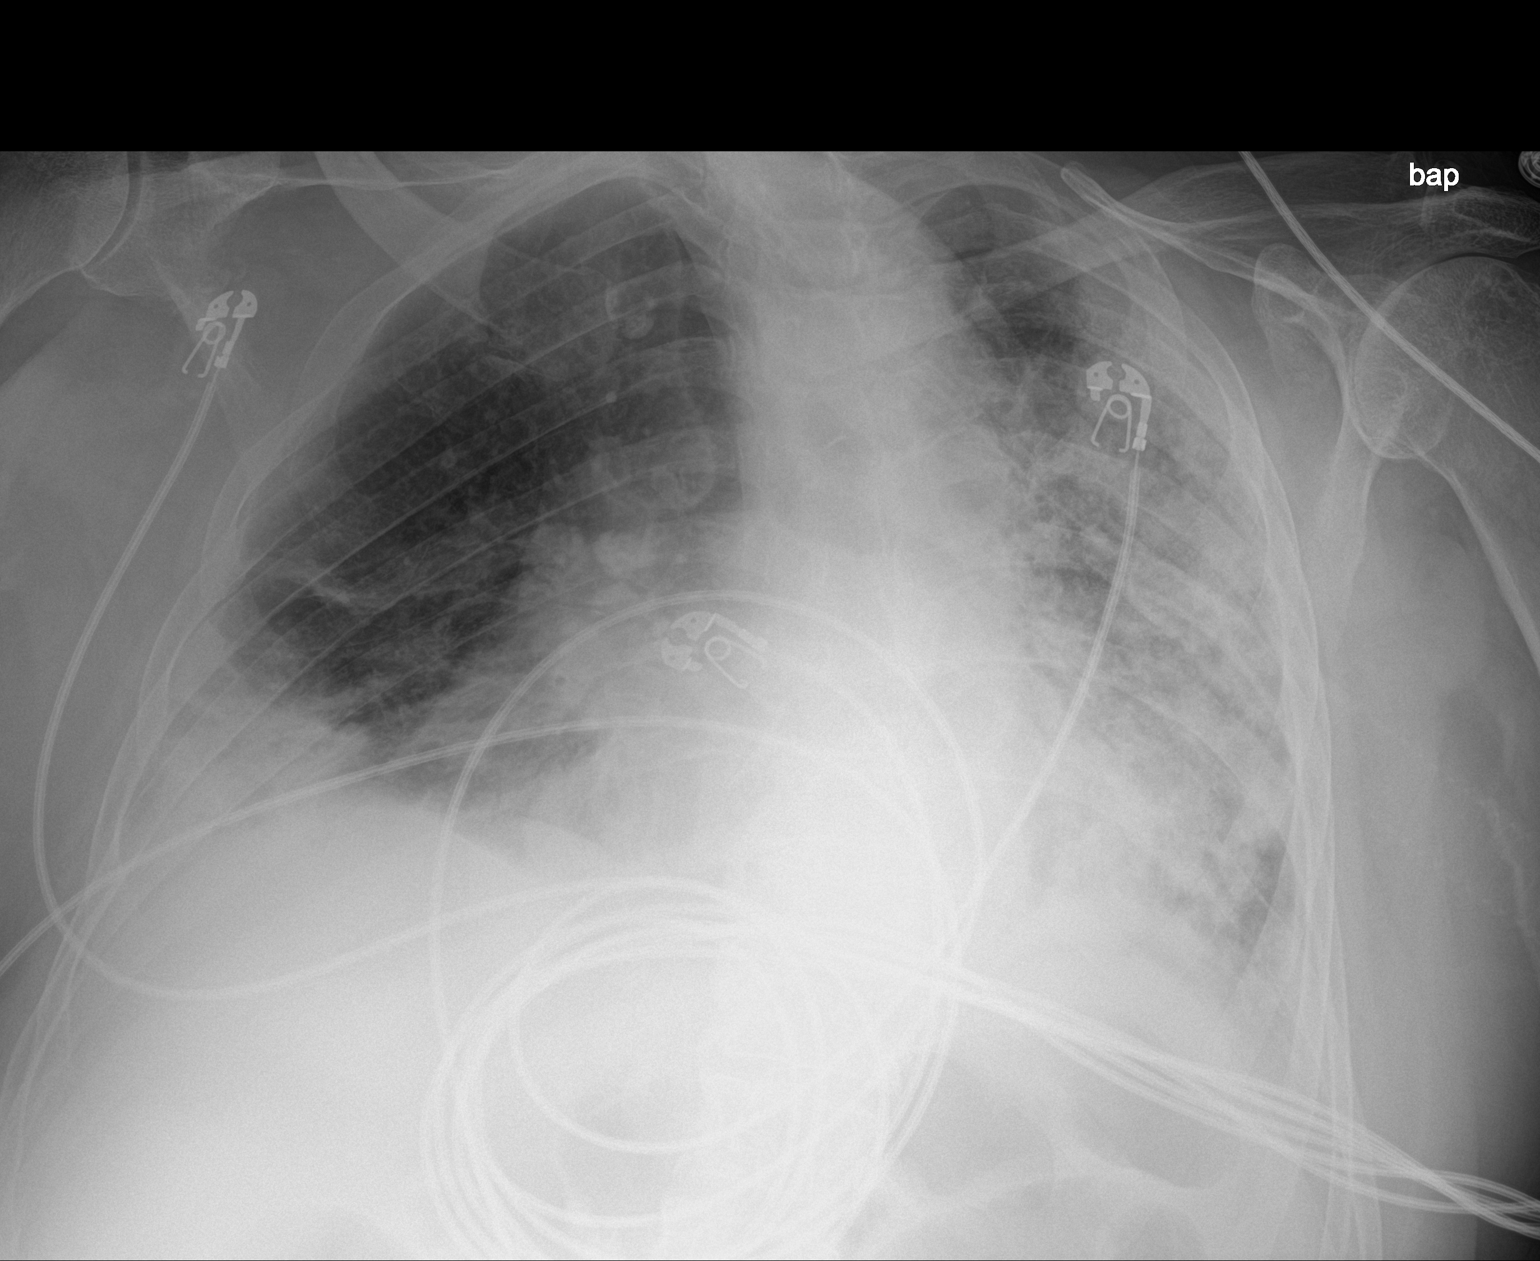
[im 2/2]
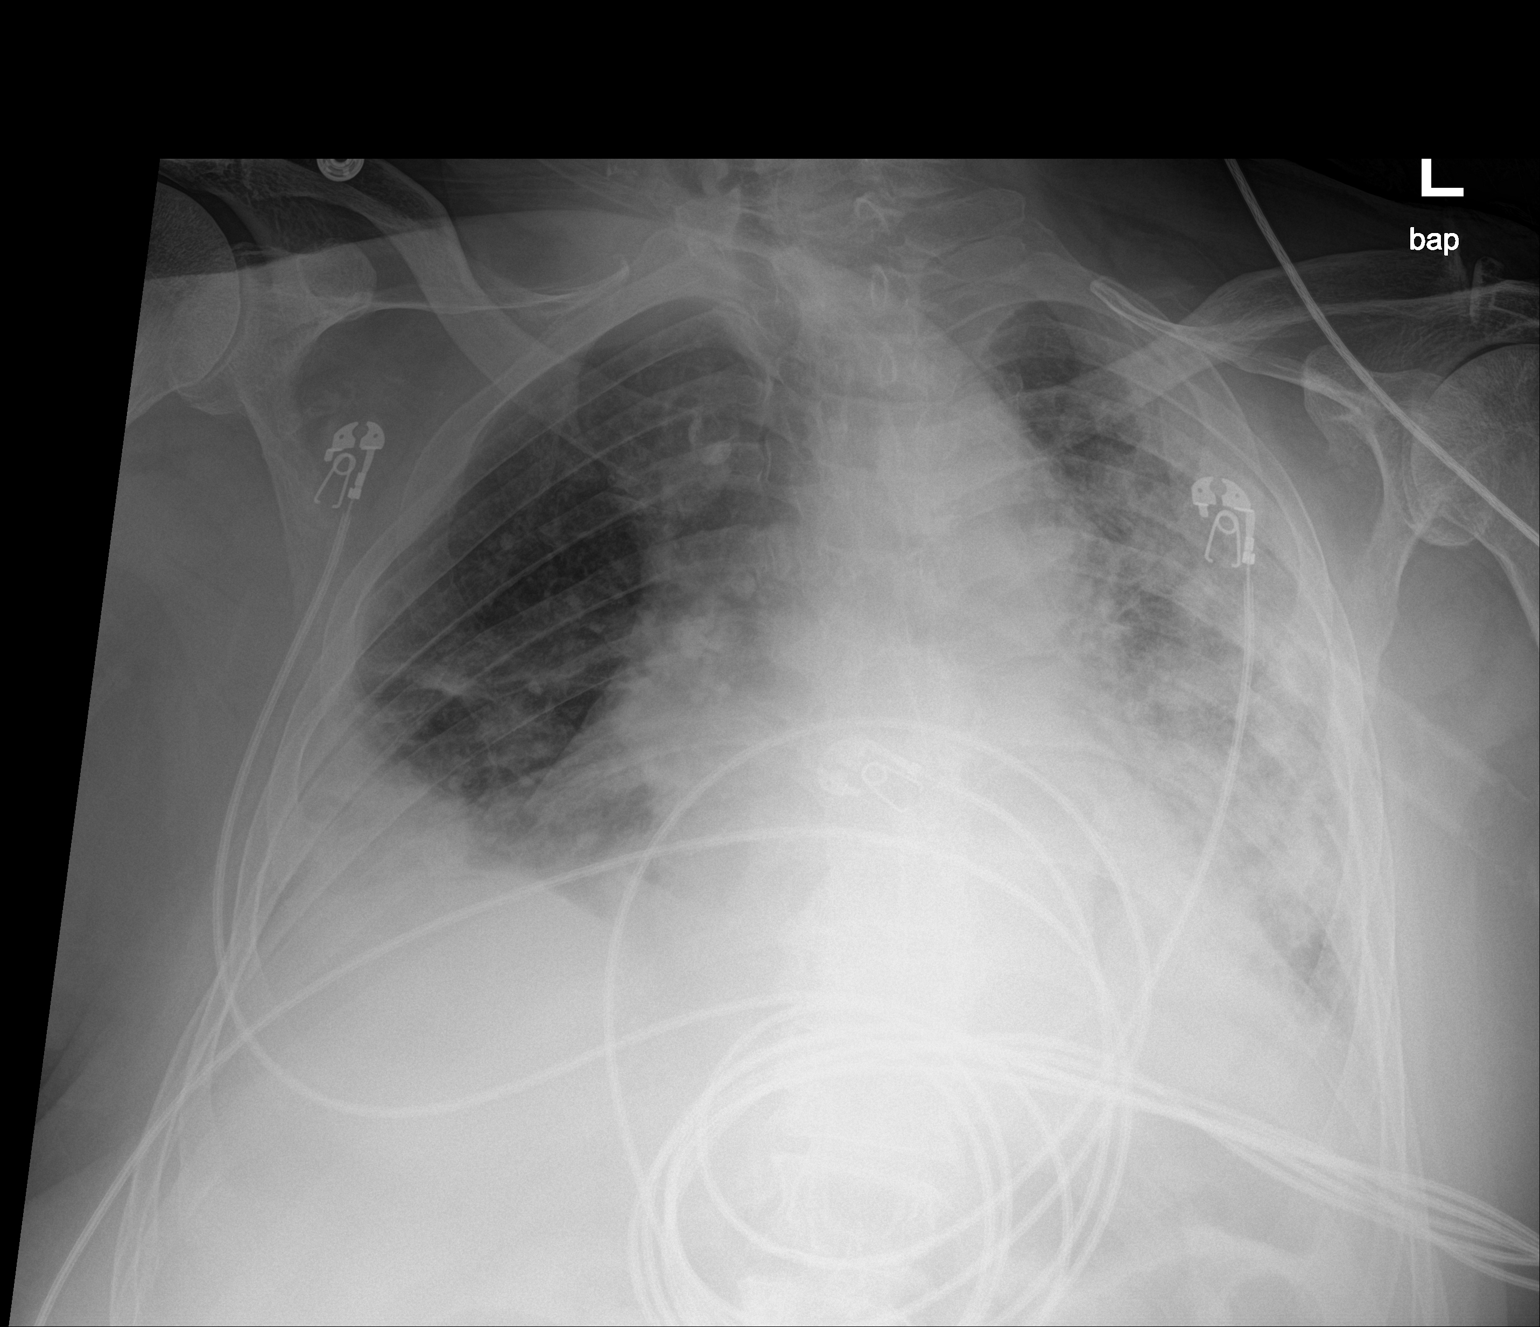

[2 of 2 positions shown; findings below may reference images not displayed]

FINDINGS: Pneumonia on the left more than right with volume loss on the left,
unchanged. Cardiomegaly accentuated by rotation. Calcified
mediastinal lymph nodes. No visible effusion or pneumothorax.
Extensive artifact from EKG leads.
IMPRESSION: Unchanged multifocal pneumonia.

## 2020-02-10 MED ORDER — SODIUM ZIRCONIUM CYCLOSILICATE 5 G PO PACK
5.0000 g | PACK | Freq: Three times a day (TID) | ORAL | Status: AC
  Administered 2020-02-10 – 2020-02-11 (×3): 5 g via ORAL
  Filled 2020-02-10 (×3): qty 1

## 2020-02-10 MED ORDER — LACTATED RINGERS IV BOLUS
1000.0000 mL | Freq: Once | INTRAVENOUS | Status: AC
  Administered 2020-02-10: 1000 mL via INTRAVENOUS

## 2020-02-10 NOTE — Progress Notes (Signed)
NAME:  Javier Beard, MRN:  [id number], DOB:  [birth year], LOS: 14 ADMISSION DATE:  02/15/2020, CONSULTATION DATE:  02/10/20 REFERRING MD:  Dr. Loney Loh, CHIEF COMPLAINT:  Worsening dyspnea   Brief History:  80 y.o. 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  PCCM consulted for possible bipap  History of Present Illness:  Javier Beard is a 80 y.o. 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   Early morning hours of 1/12 Pt became acutely more dyspneic and had increasing oxygen requirements from 3L to 15L and non-rebreather.  He has not had increasing leukocytosis and no known HF.   ABG 7.4/26/188/19.   PCCM consulted for possible Bipap and ICU transfer. Pulmonary critical care called back 02/09/2020 for acute decompensation after 13 days of treatment of COVID.  Noted to have worsening renal failure, worsening mental status, and hypotension.  Pulmonary critical care at bedside at the request of rapid response team.  Transferred to the medical intensive care unit.  Family has been contacted for possible limited CODE BLUE status to avoid intubation if possible.  Past Medical History:   has a past medical history of Arthritis, Atrial fibrillation (HCC), Coronary artery disease, DM type 2 (diabetes mellitus, type 2) (HCC), Hypertension, and Rheumatoid arteritis (HCC).   Significant Hospital Events:  1/8 Admitted 1/12 PCCM consult, transfer to progressive   Consults:  PCCM  Procedures:    Significant Diagnostic Tests:  1/12 CXR>>Increasing heterogeneous opacities throughout both lungs,  concerning for worsening infection in the setting of COVID-19  Micro Data:    Antimicrobials:   Remdesevir 1/7-1/11 02/09/2020 Zyvox 02/09/2020 cefepime 02/10/2020 >> cefazolin (MSSA bacteremia/pna)  Interim History / Subjective:  3.5L IVF bolus through day, pressor requirement better. CR improved - not to baseline, UOP picked up overnight, Confused this AM. WOB still increased.  Objective   Blood pressure 108/61, pulse (!) 104, temperature 98.8 F (37.1 C), temperature source Axillary, resp. rate (!) 38, height 5\' 6"  (1.676 m), weight 77.5 kg, SpO2 91 %.    FiO2 (%):  [80 %-100 %] 80 %   Intake/Output Summary (Last 24 hours) at 02/10/2020 0829 Last data filed at 02/10/2020 0604 Gross per 24 hour  Intake 2500.29 ml  Output 1000 ml  Net 1500.29 ml   Filed Weights   02/13/2020 1316 02/03/20 0410 02/09/20 0915  Weight: 81.6 kg 72.6 kg 77.5 kg    General: Frail elderly male with obvious change in mental status rapid respiratory rate HEENT: Oral mucosa is dry, no JVD is appreciated.  Neuro: Awake, alert, confused CV: Heart sounds are distant PULM: Rapid, shallow breathing, corase GI: soft, bsx4 active  Extremities: warm/dry, +1 edema LE Skin: no rashes or lesions    Resolved Hospital Problem list     Assessment & Plan:   Acute hypoxemic respiratory failure: Multifactorial. Worsened CXR. 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 Acute worsening - PE considered but on chronic Eliquis so not felt likely. Suspect aspiration given encephalopathy vs HCAP in setting of shock. Worsened in setting of metabolic acidosis increasing ventilatory needs, hyperventilation.   --DNR per GOC and description of his  values  --HFNC, goal SPO2>88  --Abx as below    Septic shock due to MSSA pneumonia: Leukocytosis, tachypnea, tachycardia with worsening hypoxemia and CXR infiltrates. MSSA in blood. --Cefazolin --NE peripherally for MAP 65  --Consider PICC  --ID will  consult given MSSA bacteremia   Acute renal Failure: Septic shock, hypovolemia - likely ATN. CR improved. UOP ok overnight. --1L IVF bolus this AM --Pressors, MAP > 65    Toxic metabolic encephalopathy: in setting of sepsis, renal failure, hypoxemia, delirium.    COVID ARDS:  --weaning steroids  --Oxygen support as above    Afib: chronic  --eliquis, if unable to take PO may need lovenox/heparin gtt pending clinicla trajectory/GOC   Best practice (evaluated daily)  Diet: npo Pain/Anxiety/Delirium protocol (if indicated): n/a VAP protocol (if indicated): n/a DVT prophylaxis: eliquis GI prophylaxis: n/a Glucose control:ssi Mobility: bed rest Disposition:icu  Goals of Care:  Last date of multidisciplinary goals of care discussion: 02/09/20 Family and staff present: MD, RN, daughter, cousin Summary of discussion: DNR. Continue aggressive care. Consider comfort if not improving or worsens Follow up goals of care discussion due: 1/24 Code Status: DNR  Labs   CBC: Recent Labs  Lab 02/06/20 0158 02/07/20 0353 02/08/20 0234 02/09/20 0201 02/10/20 0308  WBC 13.4* 12.5* 12.9* 30.4* 24.3*  NEUTROABS 12.2* 11.1*  --   --   --   HGB 11.2* 12.2* 11.4* 12.6* 11.6*  HCT 31.9* 36.3* 32.9* 38.3* 34.9*  MCV 91.4 91.2 91.4 93.6 97.5  PLT 183 167 169 199 PLATELET CLUMPS NOTED ON SMEAR, UNABLE TO ESTIMATE    Basic Metabolic Panel: Recent Labs  Lab 02/06/20 0158 02/07/20 0353 02/08/20 0234 02/09/20 0201 02/10/20 0308  NA 131* 128* 128* 132* 135  K 4.2 4.6 4.2 4.4 5.2*  CL 100 99 99 102 106  CO2 21* 20* 20* 17* 15*  GLUCOSE 166* 180* 212* 76 208*  BUN 24* 24* 27* 38* 31*  CREATININE 0.78 0.76 0.90 1.88* 1.32*  CALCIUM 7.6* 7.6* 7.6* 8.0* 7.5*  MG 2.3 2.3 2.3 2.0 2.1  PHOS  --   --   --   --  4.0   GFR: Estimated Creatinine Clearance: 45.9 mL/min (A) (by C-G formula based on SCr of 1.32 mg/dL (H)). Recent Labs  Lab 02/07/20 0353 02/08/20 0234 02/09/20 0201  02/09/20 0806 02/10/20 0308  PROCALCITON  --   --   --  2.89  --   WBC 12.5* 12.9* 30.4*  --  24.3*    Liver Function Tests: No results for input(s): AST, ALT, ALKPHOS, BILITOT, PROT, ALBUMIN in the last 168 hours. No results for input(s): LIPASE, AMYLASE in the last 168 hours. Recent Labs  Lab 02/09/20 0959  AMMONIA 30    ABG    Component Value Date/Time   PHART 7.483 (H) 01/31/2020 0350   PCO2ART 26.2 (L) 01/31/2020 0350   PO2ART 188 (H) 01/31/2020 0350   HCO3 18.8 (L) 02/08/2020 2133   ACIDBASEDEF 5.2 (H) 02/08/2020 2133   O2SAT 86.2 02/08/2020 2133     Coagulation Profile: No results for input(s): INR, PROTIME in the last 168 hours.  Cardiac Enzymes: No results for input(s): CKTOTAL, CKMB, CKMBINDEX, TROPONINI in the last 168 hours.  HbA1C: No results found for: HGBA1C  CBG: Recent Labs  Lab 02/09/20 0825 02/09/20 1145 02/09/20 1555 02/09/20 2225 02/10/20 0756  GLUCAP 119* 130* 220* 193* 165*     CRITICAL CARE Performed by: Lesia Sago Hunsucker   Total critical care time: 38 minutes  Critical care time was exclusive of separately billable procedures and treating other patients.  Critical care was necessary to treat or prevent imminent or life-threatening deterioration.  Critical care was time spent personally by me on the following activities: development of treatment plan with patient and/or surrogate as well as nursing, discussions with consultants, evaluation of patient's response to treatment, examination of patient, obtaining history from patient or surrogate, ordering and performing treatments and interventions, ordering and review of laboratory studies, ordering and review of radiographic studies, pulse oximetry and re-evaluation of patient's condition.

## 2020-02-10 NOTE — Consult Note (Signed)
Date of Admission:  02-11-2020          Reason for Consult: MSSA bacteremia in patient with COVID and ARDS    Referring Provider: CHAMP auto consult   Assessment:  1. MSSA bacteremia  2. COVID Pneumonia with possible MSSA super infection as source 3. Rule out C spine, T spine infection with MSSA 4. RA 5. Coronary artery disease 6. Paroxysmal atrial fibrillation  Plan:  1. Continue cefazolin 2. Repeat blood cultures 3. Repeat 2D echocardiogram 4. IF POSSIBLE hold on PICC or central line until we can clear his blood but if not possible we can give him a "line holiday later" 5. I would also like to MRI C and T spine when stable   Principal Problem:   Acute hypoxemic respiratory failure due to COVID-19 [facility]) Active Problems:   Type 2 diabetes mellitus with vascular disease (HCC)   Rheumatoid arthritis (HCC)   PAF (paroxysmal atrial fibrillation) (HCC)   CAD (coronary artery disease)   Pneumonia due to COVID-19 virus   Hyponatremia   Multifocal pneumonia   Scheduled Meds: . apixaban  5 mg Oral BID  . vitamin C  500 mg Oral Daily  . calcium carbonate  1,250 mg Oral Q breakfast  . calcium carbonate  0.5 tablet Oral Q supper  . Chlorhexidine Gluconate Cloth  6 each Topical Daily  . doxazosin  1 mg Oral QHS  . folic acid  1 mg Oral Daily  . glipiZIDE  2.5 mg Oral QAC breakfast  . hydroxychloroquine  200 mg Oral BID  . insulin aspart  0-20 Units Subcutaneous TID WC  . insulin aspart  0-5 Units Subcutaneous QHS  . mouth rinse  15 mL Mouth Rinse BID  . methylPREDNISolone (SOLU-MEDROL) injection  40 mg Intravenous Daily  . pantoprazole  40 mg Oral Daily  . pravastatin  80 mg Oral q1800  . sodium zirconium cyclosilicate  5 g Oral TID  . vitamin B-12  1,000 mcg Oral Daily  . zinc sulfate  220 mg Oral Daily   Continuous Infusions: . sodium chloride Stopped (02/09/20 0436)  . sodium chloride Stopped (02/10/20 0058)  .  ceFAZolin (ANCEF) IV 2 g (02/10/20 0604)  .  norepinephrine (LEVOPHED) Adult infusion 1 mcg/min (02/10/20 0300)   PRN Meds:.acetaminophen **OR** acetaminophen, morphine injection, senna-docusate  HPI: Javier Beard is a 80 y.o. male with multiple medical problems who is by his report fully vaccinated and boosted against COVID-19 who was admitted on January 8 with COVID-pneumonia.  He has received appropriate care but had worsening ARDS and was transferred to the intensive care unit.  Since then he developed hypotension and blood cultures were obtained which have now yielded methicillin sensitive Staph aureus.  His CODE STATUS it was initially full code but he is now a DNR and on high flow oxygen.  He was started on cefepime and Zyvox in the context of renal failure though renal function is now improving.  We have narrowed him to cefazolin and I have repeated blood cultures.  In talking to him he has had neck pain and back pain for roughly 3 weeks and I wonder if he has had an occult MSSA infection at the sites.  He certainly also could have developed MSSA infection as a secondary pneumonia in the context of his COVID infection.  I do not see evidence of thrombophlebitis to suggest that as a source.  I would like to image his cervical and thoracic spine with  an MRI when it is stable to do so he is currently still on pressors.     Review of Systems: Review of Systems  Constitutional: Negative for chills, diaphoresis, fever, malaise/fatigue and weight loss.  HENT: Negative for congestion, ear pain, hearing loss and sore throat.   Eyes: Negative for blurred vision and double vision.  Respiratory: Positive for cough and shortness of breath. Negative for sputum production, wheezing and stridor.   Cardiovascular: Positive for chest pain. Negative for palpitations and leg swelling.  Gastrointestinal: Negative for abdominal pain, blood in stool, constipation, diarrhea, heartburn, melena, nausea and vomiting.  Genitourinary: Negative  for dysuria, flank pain and frequency.  Musculoskeletal: Positive for back pain, myalgias and neck pain. Negative for joint pain.  Neurological: Negative for dizziness, sensory change, focal weakness, loss of consciousness, weakness and headaches.  Endo/Heme/Allergies: Does not bruise/bleed easily.  Psychiatric/Behavioral: Negative for depression, substance abuse and suicidal ideas. The patient does not have insomnia.     Past Medical History:  Diagnosis Date  . Arthritis   . Atrial fibrillation (HCC)   . Coronary artery disease   . DM type 2 (diabetes mellitus, type 2) (HCC)   . Hypertension   . Rheumatoid arteritis (HCC)     Social History   Tobacco Use  . Smoking status: Former Games developer  . Smokeless tobacco: Never Used  Substance Use Topics  . Alcohol use: No  . Drug use: No    Family History  Problem Relation Age of Onset  . Diabetes Mellitus II Maternal Grandmother    No Known Allergies  OBJECTIVE: Blood pressure 108/61, pulse (!) 104, temperature 98.8 F (37.1 C), temperature source Axillary, resp. rate (!) 38, height 5\' 6"  (1.676 m), weight 77.5 kg, SpO2 91 %.  Physical Exam Constitutional:      General: He is not in acute distress.    Appearance: Normal appearance. He is well-developed and well-nourished. He is not ill-appearing or diaphoretic.  HENT:     Head: Normocephalic and atraumatic.     Right Ear: Hearing and external ear normal.     Left Ear: Hearing and external ear normal.     Nose: No nasal deformity, rhinorrhea or epistaxis.  Eyes:     General: No scleral icterus.    Extraocular Movements: Extraocular movements intact and EOM normal.     Conjunctiva/sclera: Conjunctivae normal.     Right eye: Right conjunctiva is not injected.     Left eye: Left conjunctiva is not injected.  Cardiovascular:     Rate and Rhythm: Tachycardia present. Rhythm irregular.     Heart sounds: Normal heart sounds, S1 normal and S2 normal. No murmur heard. No friction  rub.  Pulmonary:     Effort: Tachypnea present.  Chest:     Chest wall: No deformity.  Abdominal:     General: Bowel sounds are normal. There is no distension or ascites.     Palpations: Abdomen is soft. There is no hepatosplenomegaly.     Tenderness: There is no abdominal tenderness.  Musculoskeletal:     Right shoulder: Normal.     Right hand: Deformity present.     Left hand: Deformity present.     Cervical back: Neck supple. Decreased range of motion.     Right hip: Normal.     Left hip: Normal.     Right knee: Normal.     Left knee: Normal.  Lymphadenopathy:     Head:     Right side of head: No  submandibular, preauricular or posterior auricular adenopathy.     Left side of head: No submandibular, preauricular or posterior auricular adenopathy.  Skin:    General: Skin is warm, dry and intact.     Coloration: Skin is not pale.     Findings: No abrasion, bruising, ecchymosis, erythema, lesion or rash.     Nails: There is no clubbing or cyanosis.  Neurological:     Mental Status: He is alert and oriented to person, place, and time.     Sensory: Sensation is intact. No sensory deficit.     Motor: Motor function is intact.     Coordination: Coordination normal.     Gait: Gait normal.     Deep Tendon Reflexes: Strength normal.  Psychiatric:        Attention and Perception: He is attentive.        Mood and Affect: Mood is anxious and depressed.        Speech: Speech is delayed.        Behavior: Behavior is cooperative.        Cognition and Memory: Cognition and memory normal.     Lab Results Lab Results  Component Value Date   WBC 24.3 (H) 02/10/2020   HGB 11.6 (L) 02/10/2020   HCT 34.9 (L) 02/10/2020   MCV 97.5 02/10/2020   PLT PLATELET CLUMPS NOTED ON SMEAR, UNABLE TO ESTIMATE 02/10/2020    Lab Results  Component Value Date   CREATININE 1.32 (H) 02/10/2020   BUN 31 (H) 02/10/2020   NA 135 02/10/2020   K 5.2 (H) 02/10/2020   CL 106 02/10/2020   CO2 15 (L)  02/10/2020    Lab Results  Component Value Date   ALT 19 02/02/2020   AST 18 02/02/2020   ALKPHOS 37 (L) 02/02/2020   BILITOT 0.9 02/02/2020     Microbiology: Recent Results (from the past 240 hour(s))  Culture, blood (Routine X 2) w Reflex to ID Panel     Status: Abnormal (Preliminary result)   Collection Time: 02/09/20  8:10 AM   Specimen: BLOOD LEFT HAND  Result Value Ref Range Status   Specimen Description BLOOD LEFT HAND  Final   Special Requests   Final    BOTTLES DRAWN AEROBIC ONLY Blood Culture results may not be optimal due to an inadequate volume of blood received in culture bottles   Culture  Setup Time   Final    GRAM POSITIVE COCCI IN CLUSTERS AEROBIC BOTTLE ONLY CRITICAL VALUE NOTED.  VALUE IS CONSISTENT WITH PREVIOUSLY REPORTED AND CALLED VALUE. Performed at [facility], [address], [place], [place] [address]    Culture STAPHYLOCOCCUS AUREUS (A)  Final   Report Status PENDING  Incomplete  Culture, blood (Routine X 2) w Reflex to ID Panel     Status: Abnormal (Preliminary result)   Collection Time: 02/09/20  8:22 AM   Specimen: BLOOD RIGHT HAND  Result Value Ref Range Status   Specimen Description BLOOD RIGHT HAND  Final   Special Requests   Final    BOTTLES DRAWN AEROBIC AND ANAEROBIC Blood Culture results may not be optimal due to an inadequate volume of blood received in culture bottles   Culture  Setup Time   Final    GRAM POSITIVE COCCI IN CLUSTERS IN BOTH AEROBIC AND ANAEROBIC BOTTLES Organism ID to follow CRITICAL RESULT CALLED TO, READ BACK BY AND VERIFIED WITH: C AMEND PHARMD 02/09/20 2216 JDW    Culture (A)  Final  STAPHYLOCOCCUS AUREUS SUSCEPTIBILITIES TO FOLLOW Performed at [facility], [address], [place], [place] [address]    Report Status PENDING  Incomplete  Blood Culture ID Panel (Reflexed)     Status: Abnormal   Collection Time: 02/09/20  8:22 AM  Result Value Ref Range Status   Enterococcus faecalis NOT DETECTED  NOT DETECTED Final   Enterococcus Faecium NOT DETECTED NOT DETECTED Final   Listeria monocytogenes NOT DETECTED NOT DETECTED Final   Staphylococcus species DETECTED (A) NOT DETECTED Final    Comment: CRITICAL RESULT CALLED TO, READ BACK BY AND VERIFIED WITH: C AMEND PHARMD 02/09/20 2216 JDW    Staphylococcus aureus (BCID) DETECTED (A) NOT DETECTED Final    Comment: CRITICAL RESULT CALLED TO, READ BACK BY AND VERIFIED WITH: C AMEND PHARMD 02/09/20 2216 JDW    Staphylococcus epidermidis NOT DETECTED NOT DETECTED Final   Staphylococcus lugdunensis NOT DETECTED NOT DETECTED Final   Streptococcus species NOT DETECTED NOT DETECTED Final   Streptococcus agalactiae NOT DETECTED NOT DETECTED Final   Streptococcus pneumoniae NOT DETECTED NOT DETECTED Final   Streptococcus pyogenes NOT DETECTED NOT DETECTED Final   A.calcoaceticus-baumannii NOT DETECTED NOT DETECTED Final   Bacteroides fragilis NOT DETECTED NOT DETECTED Final   Enterobacterales NOT DETECTED NOT DETECTED Final   Enterobacter cloacae complex NOT DETECTED NOT DETECTED Final   Escherichia coli NOT DETECTED NOT DETECTED Final   Klebsiella aerogenes NOT DETECTED NOT DETECTED Final   Klebsiella oxytoca NOT DETECTED NOT DETECTED Final   Klebsiella pneumoniae NOT DETECTED NOT DETECTED Final   Proteus species NOT DETECTED NOT DETECTED Final   Salmonella species NOT DETECTED NOT DETECTED Final   Serratia marcescens NOT DETECTED NOT DETECTED Final   Haemophilus influenzae NOT DETECTED NOT DETECTED Final   Neisseria meningitidis NOT DETECTED NOT DETECTED Final   Pseudomonas aeruginosa NOT DETECTED NOT DETECTED Final   Stenotrophomonas maltophilia NOT DETECTED NOT DETECTED Final   Candida albicans NOT DETECTED NOT DETECTED Final   Candida auris NOT DETECTED NOT DETECTED Final   Candida glabrata NOT DETECTED NOT DETECTED Final   Candida krusei NOT DETECTED NOT DETECTED Final   Candida parapsilosis NOT DETECTED NOT DETECTED Final   Candida  tropicalis NOT DETECTED NOT DETECTED Final   Cryptococcus neoformans/gattii NOT DETECTED NOT DETECTED Final   Meth resistant mecA/C and MREJ NOT DETECTED NOT DETECTED Final    Comment: Performed at [facility], [address] [place], [place] [address]  MRSA PCR Screening     Status: None   Collection Time: 02/09/20  9:45 AM   Specimen: Nasal Mucosa; Nasopharyngeal  Result Value Ref Range Status   MRSA by PCR NEGATIVE NEGATIVE Final    Comment:        The GeneXpert MRSA Assay (FDA approved for NASAL specimens only), is one component of a comprehensive MRSA colonization surveillance program. It is not intended to diagnose MRSA infection nor to guide or monitor treatment for MRSA infections. Performed at [facility], [address], [place], [place] [address]     Acey Lav, MD [facility] [phone] pager  02/10/2020, 9:59 AM

## 2020-02-11 ENCOUNTER — Other Ambulatory Visit (HOSPITAL_COMMUNITY): Payer: No Typology Code available for payment source

## 2020-02-11 ENCOUNTER — Inpatient Hospital Stay: Payer: Self-pay

## 2020-02-11 ENCOUNTER — Inpatient Hospital Stay (HOSPITAL_COMMUNITY): Payer: Medicare Other

## 2020-02-11 DIAGNOSIS — J9601 Acute respiratory failure with hypoxia: Secondary | ICD-10-CM | POA: Diagnosis not present

## 2020-02-11 DIAGNOSIS — R7881 Bacteremia: Secondary | ICD-10-CM

## 2020-02-11 DIAGNOSIS — J1282 Pneumonia due to coronavirus disease 2019: Secondary | ICD-10-CM | POA: Diagnosis not present

## 2020-02-11 DIAGNOSIS — Z89421 Acquired absence of other right toe(s): Secondary | ICD-10-CM

## 2020-02-11 DIAGNOSIS — U071 COVID-19: Secondary | ICD-10-CM | POA: Diagnosis not present

## 2020-02-11 DIAGNOSIS — I2510 Atherosclerotic heart disease of native coronary artery without angina pectoris: Secondary | ICD-10-CM | POA: Diagnosis not present

## 2020-02-11 DIAGNOSIS — I480 Paroxysmal atrial fibrillation: Secondary | ICD-10-CM | POA: Diagnosis not present

## 2020-02-11 LAB — CBC
HCT: 27.8 % — ABNORMAL LOW (ref 39.0–52.0)
Hemoglobin: 9.5 g/dL — ABNORMAL LOW (ref 13.0–17.0)
MCH: 31.8 pg (ref 26.0–34.0)
MCHC: 34.2 g/dL (ref 30.0–36.0)
MCV: 93 fL (ref 80.0–100.0)
Platelets: 83 10*3/uL — ABNORMAL LOW (ref 150–400)
RBC: 2.99 MIL/uL — ABNORMAL LOW (ref 4.22–5.81)
RDW: 15.6 % — ABNORMAL HIGH (ref 11.5–15.5)
WBC: 15.6 10*3/uL — ABNORMAL HIGH (ref 4.0–10.5)
nRBC: 0 % (ref 0.0–0.2)

## 2020-02-11 LAB — ECHOCARDIOGRAM LIMITED
Height: 66 in
Weight: 2733.7 oz

## 2020-02-11 LAB — GLUCOSE, CAPILLARY
Glucose-Capillary: 87 mg/dL (ref 70–99)
Glucose-Capillary: 68 mg/dL — ABNORMAL LOW (ref 70–99)
Glucose-Capillary: 111 mg/dL — ABNORMAL HIGH (ref 70–99)
Glucose-Capillary: 115 mg/dL — ABNORMAL HIGH (ref 70–99)
Glucose-Capillary: 146 mg/dL — ABNORMAL HIGH (ref 70–99)

## 2020-02-11 LAB — BASIC METABOLIC PANEL
Anion gap: 8 (ref 5–15)
BUN: 21 mg/dL (ref 8–23)
CO2: 19 mmol/L — ABNORMAL LOW (ref 22–32)
Calcium: 7.4 mg/dL — ABNORMAL LOW (ref 8.9–10.3)
Chloride: 105 mmol/L (ref 98–111)
Creatinine, Ser: 0.77 mg/dL (ref 0.61–1.24)
GFR, Estimated: >60 mL/min (ref >60)
Glucose, Bld: 102 mg/dL — ABNORMAL HIGH (ref 70–99)
Potassium: 3.8 mmol/L (ref 3.5–5.1)
Sodium: 132 mmol/L — ABNORMAL LOW (ref 135–145)

## 2020-02-11 LAB — CULTURE, BLOOD (ROUTINE X 2)

## 2020-02-11 LAB — MAGNESIUM: Magnesium: 2.1 mg/dL (ref 1.7–2.4)

## 2020-02-11 LAB — C-REACTIVE PROTEIN: CRP: 20.6 mg/dL — ABNORMAL HIGH (ref <1.0)

## 2020-02-11 MED ORDER — CHLORHEXIDINE GLUCONATE 0.12 % MT SOLN
15.0000 mL | Freq: Two times a day (BID) | OROMUCOSAL | Status: DC
  Administered 2020-02-11 – 2020-02-21 (×19): 15 mL via OROMUCOSAL
  Filled 2020-02-11 (×17): qty 15

## 2020-02-11 MED ORDER — ORAL CARE MOUTH RINSE
15.0000 mL | Freq: Two times a day (BID) | OROMUCOSAL | Status: DC
  Administered 2020-02-11: 15 mL via OROMUCOSAL

## 2020-02-11 NOTE — Progress Notes (Addendum)
Subjective:  Patient still having some neck and back pain  Antibiotics:  Anti-infectives (From admission, onward)   Start     Dose/Rate Route Frequency Ordered Stop   02/09/20 2300  ceFAZolin (ANCEF) IVPB 2g/100 mL premix        2 g 200 mL/hr over 30 Minutes Intravenous Every 8 hours 02/09/20 2233     02/09/20 1000  ceFEPIme (MAXIPIME) 1 g in sodium chloride 0.9 % 100 mL IVPB  Status:  Discontinued        1 g 200 mL/hr over 30 Minutes Intravenous Every 12 hours 02/09/20 0846 02/09/20 2233   02/09/20 1000  linezolid (ZYVOX) IVPB 600 mg  Status:  Discontinued        600 mg 300 mL/hr over 60 Minutes Intravenous Every 12 hours 02/09/20 0846 02/09/20 2233   01/27/20 1000  remdesivir 100 mg in sodium chloride 0.9 % 100 mL IVPB        100 mg 200 mL/hr over 30 Minutes Intravenous Daily 02/17/2020 1602 01/30/20 1418   01/27/20 1000  hydroxychloroquine (PLAQUENIL) tablet 200 mg        200 mg Oral 2 times daily 01/27/20 0358     2020/02/17 1630  remdesivir 100 mg in sodium chloride 0.9 % 100 mL IVPB        100 mg 200 mL/hr over 30 Minutes Intravenous Every 30 min 17-Feb-2020 1602 2020/02/17 1745      Medications: Scheduled Meds: . apixaban  5 mg Oral BID  . vitamin C  500 mg Oral Daily  . calcium carbonate  1,250 mg Oral Q breakfast  . calcium carbonate  0.5 tablet Oral Q supper  . chlorhexidine  15 mL Mouth Rinse BID  . Chlorhexidine Gluconate Cloth  6 each Topical Daily  . doxazosin  1 mg Oral QHS  . folic acid  1 mg Oral Daily  . glipiZIDE  2.5 mg Oral QAC breakfast  . hydroxychloroquine  200 mg Oral BID  . insulin aspart  0-20 Units Subcutaneous TID WC  . insulin aspart  0-5 Units Subcutaneous QHS  . mouth rinse  15 mL Mouth Rinse BID  . methylPREDNISolone (SOLU-MEDROL) injection  40 mg Intravenous Daily  . pantoprazole  40 mg Oral Daily  . pravastatin  80 mg Oral q1800  . vitamin B-12  1,000 mcg Oral Daily  . zinc sulfate  220 mg Oral Daily   Continuous Infusions: .  sodium chloride Stopped (02/09/20 0436)  . sodium chloride Stopped (02/11/20 0531)  .  ceFAZolin (ANCEF) IV 2 g (02/11/20 1404)  . norepinephrine (LEVOPHED) Adult infusion Stopped (02/10/20 1502)   PRN Meds:.acetaminophen **OR** acetaminophen, morphine injection, senna-docusate    Objective: Weight change:   Intake/Output Summary (Last 24 hours) at 02/11/2020 1517 Last data filed at 02/11/2020 1448 Gross per 24 hour  Intake 502.63 ml  Output 950 ml  Net -447.37 ml   Blood pressure 114/74, pulse 92, temperature (!) 97.4 F (36.3 C), temperature source Axillary, resp. rate (!) 22, height 5\' 6"  (1.676 m), weight 77.5 kg, SpO2 93 %. Temp:  [96.8 F (36 C)-98.7 F (37.1 C)] 97.4 F (36.3 C) (01/23 1106) Pulse Rate:  [69-98] 92 (01/23 1435) Resp:  [16-34] 22 (01/23 1435) BP: (94-130)/(52-89) 114/74 (01/23 1300) SpO2:  [89 %-100 %] 93 % (01/23 1435) FiO2 (%):  [40 %-70 %] 40 % (01/23 1435)  Physical Exam: Physical Exam HENT:     Head: Normocephalic and atraumatic.  Eyes:     Extraocular Movements: Extraocular movements intact.  Cardiovascular:     Rate and Rhythm: Rhythm irregular.     Heart sounds: Murmur heard.    Pulmonary:     Effort: No respiratory distress.     Breath sounds: Rhonchi present.  Abdominal:     General: Bowel sounds are normal. There is no distension.  Musculoskeletal:        General: Normal range of motion.       Feet:  Feet:     Right foot:     Skin integrity: Callus present. No ulcer or blister.     Left foot:     Skin integrity: No ulcer, blister, skin breakdown or erythema.  Skin:    General: Skin is warm.  Neurological:     General: No focal deficit present.     Mental Status: He is alert.       CBC:    BMET Recent Labs    02/10/20 0308 02/11/20 0209  NA 135 132*  K 5.2* 3.8  CL 106 105  CO2 15* 19*  GLUCOSE 208* 102*  BUN 31* 21  CREATININE 1.32* 0.77  CALCIUM 7.5* 7.4*     Liver Panel  No results for input(s):  PROT, ALBUMIN, AST, ALT, ALKPHOS, BILITOT, BILIDIR, IBILI in the last 72 hours.     Sedimentation Rate No results for input(s): ESRSEDRATE in the last 72 hours. C-Reactive Protein Recent Labs    02/10/20 0308 02/11/20 0209  CRP 28.0* 20.6*    Micro Results: Recent Results (from the past 720 hour(s))  Resp Panel by RT-PCR (Flu A&B, Covid) Nasopharyngeal Swab     Status: Abnormal   Collection Time: 14-Feb-2020  1:42 PM   Specimen: Nasopharyngeal Swab; Nasopharyngeal(NP) swabs in vial transport medium  Result Value Ref Range Status   SARS Coronavirus 2 by RT PCR POSITIVE (A) NEGATIVE Final    Comment: RESULT CALLED TO, READ BACK BY AND VERIFIED WITH: SIMMS.M,RN @ 1547 ON February 14, 2020, [email or web address].P (NOTE) SARS-CoV-2 target nucleic acids are DETECTED.  The SARS-CoV-2 RNA is generally detectable in upper respiratory specimens during the acute phase of infection. Positive results are indicative of the presence of the identified virus, but do not rule out bacterial infection or co-infection with other pathogens not detected by the test. Clinical correlation with patient history and other diagnostic information is necessary to determine patient infection status. The expected result is Negative.  Fact Sheet for Patients: BloggerCourse.com  Fact Sheet for Healthcare Providers: SeriousBroker.it  This test is not yet approved or cleared by the [place] FDA and  has been authorized for detection and/or diagnosis of SARS-CoV-2 by FDA under an Emergency Use Authorization (EUA).  This EUA will remain in effect (meaning this  test can be used) for the duration of  the COVID-19 declaration under Section 564(b)(1) of the Act, 21 U.S.C. section 360bbb-3(b)(1), unless the authorization is terminated or revoked sooner.     Influenza A by PCR NEGATIVE NEGATIVE Final   Influenza B by PCR NEGATIVE NEGATIVE Final    Comment: (NOTE) The Xpert  Xpress SARS-CoV-2/FLU/RSV plus assay is intended as an aid in the diagnosis of influenza from Nasopharyngeal swab specimens and should not be used as a sole basis for treatment. Nasal washings and aspirates are unacceptable for Xpert Xpress SARS-CoV-2/FLU/RSV testing.  Fact Sheet for Patients: BloggerCourse.com  Fact Sheet for Healthcare Providers: SeriousBroker.it  This test is not yet approved or cleared by the [place] and  has been authorized for detection and/or diagnosis of SARS-CoV-2 by FDA under an Emergency Use Authorization (EUA). This EUA will remain in effect (meaning this test can be used) for the duration of the COVID-19 declaration under Section 564(b)(1) of the Act, 21 U.S.C. section 360bbb-3(b)(1), unless the authorization is terminated or revoked.  Performed at [facility], [address] [place], [place] [address]   Blood Culture (routine x 2)     Status: None   Collection Time: 02-Feb-2020  4:11 PM   Specimen: BLOOD LEFT FOREARM  Result Value Ref Range Status   Specimen Description   Final    BLOOD LEFT FOREARM Performed at [facility], [address], [place], [place] [address]    Special Requests   Final    BOTTLES DRAWN AEROBIC AND ANAEROBIC Blood Culture adequate volume Performed at [facility], [address] [place], [place] [address]    Culture   Final    NO GROWTH 5 DAYS Performed at [facility], [address] [place], [place] [address]    Report Status 01/31/2020 FINAL  Final  Blood Culture (routine x 2)     Status: None   Collection Time: 2020/02/02  4:45 PM   Specimen: BLOOD RIGHT HAND  Result Value Ref Range Status   Specimen Description   Final    BLOOD RIGHT HAND Performed at [facility], [address], [place], [place] [address]    Special Requests   Final    BOTTLES DRAWN AEROBIC AND ANAEROBIC Blood Culture adequate  volume Performed at [facility], [address] [place], [place] [address]    Culture   Final    NO GROWTH 5 DAYS Performed at [facility], [address] [place], [place] [address]    Report Status 01/31/2020 FINAL  Final  Culture, blood (Routine X 2) w Reflex to ID Panel     Status: Abnormal   Collection Time: 02/09/20  8:10 AM   Specimen: BLOOD LEFT HAND  Result Value Ref Range Status   Specimen Description BLOOD LEFT HAND  Final   Special Requests   Final    BOTTLES DRAWN AEROBIC ONLY Blood Culture results may not be optimal due to an inadequate volume of blood received in culture bottles   Culture  Setup Time   Final    GRAM POSITIVE COCCI IN CLUSTERS AEROBIC BOTTLE ONLY CRITICAL VALUE NOTED.  VALUE IS CONSISTENT WITH PREVIOUSLY REPORTED AND CALLED VALUE.    Culture (A)  Final    STAPHYLOCOCCUS AUREUS SUSCEPTIBILITIES PERFORMED ON PREVIOUS CULTURE WITHIN THE LAST 5 DAYS. Performed at [facility], [address], [place], [place] [address]    Report Status 02/11/2020 FINAL  Final  Culture, blood (Routine X 2) w Reflex to ID Panel     Status: Abnormal   Collection Time: 02/09/20  8:22 AM   Specimen: BLOOD RIGHT HAND  Result Value Ref Range Status   Specimen Description BLOOD RIGHT HAND  Final   Special Requests   Final    BOTTLES DRAWN AEROBIC AND ANAEROBIC Blood Culture results may not be optimal due to an inadequate volume of blood received in culture bottles   Culture  Setup Time   Final    GRAM POSITIVE COCCI IN CLUSTERS IN BOTH AEROBIC AND ANAEROBIC BOTTLES Organism ID to follow CRITICAL RESULT CALLED TO, READ BACK BY AND VERIFIED WITH: C AMEND PHARMD  02/09/20 2216 JDW Performed at [facility], [address], [place], [place] [address]    Culture STAPHYLOCOCCUS AUREUS (A)  Final   Report Status 02/11/2020 FINAL  Final   Organism ID, Bacteria STAPHYLOCOCCUS AUREUS  Final      Susceptibility   Staphylococcus aureus - MIC*     CIPROFLOXACIN <=0.5 SENSITIVE Sensitive     ERYTHROMYCIN <=0.25 SENSITIVE Sensitive     GENTAMICIN <=0.5 SENSITIVE Sensitive     OXACILLIN 0.5 SENSITIVE Sensitive     TETRACYCLINE <=1 SENSITIVE Sensitive     VANCOMYCIN 1 SENSITIVE Sensitive     TRIMETH/SULFA <=10 SENSITIVE Sensitive     CLINDAMYCIN <=0.25 SENSITIVE Sensitive     RIFAMPIN <=0.5 SENSITIVE Sensitive     Inducible Clindamycin NEGATIVE Sensitive     * STAPHYLOCOCCUS AUREUS  Blood Culture ID Panel (Reflexed)     Status: Abnormal   Collection Time: 02/09/20  8:22 AM  Result Value Ref Range Status   Enterococcus faecalis NOT DETECTED NOT DETECTED Final   Enterococcus Faecium NOT DETECTED NOT DETECTED Final   Listeria monocytogenes NOT DETECTED NOT DETECTED Final   Staphylococcus species DETECTED (A) NOT DETECTED Final    Comment: CRITICAL RESULT CALLED TO, READ BACK BY AND VERIFIED WITH: C AMEND PHARMD 02/09/20 2216 JDW    Staphylococcus aureus (BCID) DETECTED (A) NOT DETECTED Final    Comment: CRITICAL RESULT CALLED TO, READ BACK BY AND VERIFIED WITH: C AMEND PHARMD 02/09/20 2216 JDW    Staphylococcus epidermidis NOT DETECTED NOT DETECTED Final   Staphylococcus lugdunensis NOT DETECTED NOT DETECTED Final   Streptococcus species NOT DETECTED NOT DETECTED Final   Streptococcus agalactiae NOT DETECTED NOT DETECTED Final   Streptococcus pneumoniae NOT DETECTED NOT DETECTED Final   Streptococcus pyogenes NOT DETECTED NOT DETECTED Final   A.calcoaceticus-baumannii NOT DETECTED NOT DETECTED Final   Bacteroides fragilis NOT DETECTED NOT DETECTED Final   Enterobacterales NOT DETECTED NOT DETECTED Final   Enterobacter cloacae complex NOT DETECTED NOT DETECTED Final   Escherichia coli NOT DETECTED NOT DETECTED Final   Klebsiella aerogenes NOT DETECTED NOT DETECTED Final   Klebsiella oxytoca NOT DETECTED NOT DETECTED Final   Klebsiella pneumoniae NOT DETECTED NOT DETECTED Final   Proteus species NOT DETECTED NOT DETECTED Final    Salmonella species NOT DETECTED NOT DETECTED Final   Serratia marcescens NOT DETECTED NOT DETECTED Final   Haemophilus influenzae NOT DETECTED NOT DETECTED Final   Neisseria meningitidis NOT DETECTED NOT DETECTED Final   Pseudomonas aeruginosa NOT DETECTED NOT DETECTED Final   Stenotrophomonas maltophilia NOT DETECTED NOT DETECTED Final   Candida albicans NOT DETECTED NOT DETECTED Final   Candida auris NOT DETECTED NOT DETECTED Final   Candida glabrata NOT DETECTED NOT DETECTED Final   Candida krusei NOT DETECTED NOT DETECTED Final   Candida parapsilosis NOT DETECTED NOT DETECTED Final   Candida tropicalis NOT DETECTED NOT DETECTED Final   Cryptococcus neoformans/gattii NOT DETECTED NOT DETECTED Final   Meth resistant mecA/C and MREJ NOT DETECTED NOT DETECTED Final    Comment: Performed at [facility], [address] [place], [place] [address]  MRSA PCR Screening     Status: None   Collection Time: 02/09/20  9:45 AM   Specimen: Nasal Mucosa; Nasopharyngeal  Result Value Ref Range Status   MRSA by PCR NEGATIVE NEGATIVE Final    Comment:        The GeneXpert MRSA Assay (FDA approved for NASAL specimens only), is one component of a  comprehensive MRSA colonization surveillance program. It is not intended to diagnose MRSA infection nor to guide or monitor treatment for MRSA infections. Performed at [facility], [address] [place], [place] [address]   Culture, blood (Routine X 2) w Reflex to ID Panel     Status: None (Preliminary result)   Collection Time: 02/10/20  3:30 AM   Specimen: BLOOD LEFT HAND  Result Value Ref Range Status   Specimen Description BLOOD LEFT HAND  Final   Special Requests   Final    AEROBIC BOTTLE ONLY Blood Culture results may not be optimal due to an inadequate volume of blood received in culture bottles   Culture   Final    NO GROWTH 1 DAY Performed at [facility], [address], [place], [place] [address]    Report Status  PENDING  Incomplete  Culture, blood (Routine X 2) w Reflex to ID Panel     Status: None (Preliminary result)   Collection Time: 02/10/20  6:37 PM   Specimen: BLOOD  Result Value Ref Range Status   Specimen Description BLOOD SITE NOT SPECIFIED  Final   Special Requests   Final    BOTTLES DRAWN AEROBIC AND ANAEROBIC Blood Culture adequate volume   Culture   Final    NO GROWTH < 24 HOURS Performed at [facility], [address], [place], [place] [address]    Report Status PENDING  Incomplete    Studies/Results: DG Chest Port 1 View  Result Date: 02/10/2020 CLINICAL DATA:  Abnormal respiration EXAM: PORTABLE CHEST 1 VIEW COMPARISON:  Yesterday FINDINGS: Pneumonia on the left more than right with volume loss on the left, unchanged. Cardiomegaly accentuated by rotation. Calcified mediastinal lymph nodes. No visible effusion or pneumothorax. Extensive artifact from EKG leads. IMPRESSION: Unchanged multifocal pneumonia. Electronically Signed   By: Marnee Spring M.D.   On: 02/10/2020 05:54   [place] EKG SITE RITE  Result Date: 02/11/2020 If Site Rite image not attached, placement could not be confirmed due to current cardiac rhythm.     Assessment/Plan:  INTERVAL HISTORY: Patient is stabilized on come off pressors   Principal Problem:   Acute hypoxemic respiratory failure due to COVID-19 [facility]) Active Problems:   Type 2 diabetes mellitus with vascular disease (HCC)   Rheumatoid arthritis (HCC)   PAF (paroxysmal atrial fibrillation) (HCC)   CAD (coronary artery disease)   Pneumonia due to COVID-19 virus   Hyponatremia   Multifocal pneumonia    ZENITH LAMPHIER is a 80 y.o. male with history of rheumatoid arthritis, prior amputation of a toe, admission with COVID-pneumonia now found to have methicillin sensitive Staph aureus bacteremia.  #1 methicillin sensitive Staphylococcus aureus bacteremia:  Is reason for his decompensation.  Certainly could have developed this as a  superinfection or secondary infection after his COVID-pneumonia  He also has had neck pain and back pain for the last several weeks and I wonder if he may be had a smoldering occult infection there.  When able I would get an MRI of his cervical and thoracic spine.  I would plan on giving him at least 4 weeks of systemic therapy.  TEE if cardiology will do one on him.  #2 rheumatoid arthritis: When I examined him today he was on video call with his daughter who is concerned about the possibility of him having immunosuppressive drugs held as this has in the past caused worsening of his rheumatoid arthritis and further debilitation.  #3  pressure ulcer: Continue to monitor  #4 COVID: has been treated for this and is in airborne, contact   LOS: 15 days   Acey Lav 02/11/2020, 3:17 PM

## 2020-02-11 NOTE — Progress Notes (Signed)
Secure chat with Dr Judeth Horn.  Will cancel PICC order until blood cultures negative x48 hours or unable to obtain PIV access per ID notes.

## 2020-02-11 NOTE — Progress Notes (Signed)
NAME:  Javier Beard, MRN:  [id number], DOB:  1940-02-01, LOS: 15 ADMISSION DATE:  02/14/2020, CONSULTATION DATE:  02/11/20 REFERRING MD:  Dr. Loney Loh, CHIEF COMPLAINT:  Worsening dyspnea   Brief History:  80 y.o. 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  PCCM consulted for possible bipap  History of Present Illness:  Javier Beard is a 80 y.o. 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   Early morning hours of 1/12 Pt became acutely more dyspneic and had increasing oxygen requirements from 3L to 15L and non-rebreather.  He has not had increasing leukocytosis and no known HF.   ABG 7.4/26/188/19.   PCCM consulted for possible Bipap and ICU transfer. Pulmonary critical care called back 02/09/2020 for acute decompensation after 13 days of treatment of COVID.  Noted to have worsening renal failure, worsening mental status, and hypotension.  Pulmonary critical care at bedside at the request of rapid response team.  Transferred to the medical intensive care unit.  Family has been contacted for possible limited CODE BLUE status to avoid intubation if possible.  Past Medical History:   has a past medical history of Arthritis, Atrial fibrillation (HCC), Coronary artery disease, DM type 2 (diabetes mellitus, type 2) (HCC), Hypertension, and Rheumatoid arteritis (HCC).   Significant Hospital Events:  1/8 Admitted 1/12 PCCM consult, transfer to progressive  1/21 Worsened hypoxemia, septic shock due to pna on pressors - transferred to ICU 1/22 Cr improved, pressors weaned off 1/23 Cr better, remains  off pressors, O2 slowly weaning  Consults:  PCCM  Procedures:    Significant Diagnostic Tests:  1/12 CXR>>Increasing heterogeneous opacities throughout both lungs, concerning for worsening infection in the setting of COVID-19  Micro Data:    Antimicrobials:   Remdesevir 1/7-1/11 02/09/2020 Zyvox 02/09/2020 cefepime 02/10/2020 >> cefazolin (MSSA bacteremia/pna)  Interim History / Subjective:  NAEON. Came off pressors yday afternoon. UOP ok. Cr improving. O2 slow wean.  Objective   Blood pressure (!) 102/54, pulse 83, temperature (!) 97.4 F (36.3 C), temperature source Axillary, resp. rate (!) 23, height  (1.676 m), weight 77.5 kg, SpO2 96 %.    FiO2 (%):  [60 %-80 %] 60 %   Intake/Output Summary (Last 24 hours) at 02/11/2020 0936 Last data filed at 02/11/2020 0600 Gross per 24 hour  Intake 1532.28 ml  Output 800 ml  Net 732.28 ml   Filed Weights   02/19/2020 1316 02/03/20 0410 02/09/20 0915  Weight: 81.6 kg 72.6 kg 77.5 kg    General: Frail elderly male, in NAD HEENT: Oral mucosa is dry, no JVD is appreciated.  Neuro: Awake, alert, confused CV: Heart sounds are distant PULM: Rapid, shallow breathing, corase GI: soft, bsx4 active  Extremities: warm/dry, +1 edema LE, UEs swollen Skin: no rashes or lesions   Resolved Hospital Problem list     Assessment & Plan:   Acute hypoxemic respiratory failure - improving: Multifactorial. Worsened CXR. 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 Acute worsening - PE considered but on chronic Eliquis so not felt likely. Suspect aspiration given encephalopathy vs HCAP in setting of shock. Worsened in setting of  metabolic acidosis increasing ventilatory needs, hyperventilation.   --DNR per GOC and description of his values  --HFNC, goal SPO2>88  --Abx as below    Septic shock due to MSSA pneumonia/bactermia - resolved: Leukocytosis, tachypnea, tachycardia with worsening hypoxemia and CXR infiltrates.  MSSA in blood. --Cefazolin --Consider PICC, try to wait until 1/24-1/25 as blood cultures pending - poor access may not have that luxury --ID consult given MSSA bacteremia   Acute renal Failure: Septic shock, hypovolemia - likely ATN. CR improved. UOP ok overnight. --Encourage PO intake -- MAP > 65    Toxic metabolic encephalopathy: in setting of sepsis, renal failure, hypoxemia, delirium.    COVID ARDS:  --weaning steroids  --Oxygen support as above    Afib: chronic  --eliquis, if unable to take PO may need lovenox/heparin gtt pending clinical trajectory/GOC   Best practice (evaluated daily)  Diet: npo - advance as tolerated Pain/Anxiety/Delirium protocol (if indicated): n/a VAP protocol (if indicated): n/a DVT prophylaxis: eliquis GI prophylaxis: n/a Glucose control:ssi Mobility: OOB to chair Disposition:icu  Goals of Care:  Last date of multidisciplinary goals of care discussion: 02/10/20 Family and staff present: MD, RN, daughter, cousin Summary of discussion: DNR. Continue aggressive care. Consider comfort if not improving or worsens Follow up goals of care discussion due: 1/24 Code Status: DNR  Labs   CBC: Recent Labs  Lab 02/06/20 0158 02/07/20 0353 02/08/20 0234 02/09/20 0201 02/10/20 0308 02/11/20 0209  WBC 13.4* 12.5* 12.9* 30.4* 24.3* 15.6*  NEUTROABS 12.2* 11.1*  --   --   --   --   HGB 11.2* 12.2* 11.4* 12.6* 11.6* 9.5*  HCT 31.9* 36.3* 32.9* 38.3* 34.9* 27.8*  MCV 91.4 91.2 91.4 93.6 97.5 93.0  PLT 183 167 169 199 PLATELET CLUMPS NOTED ON SMEAR, UNABLE TO ESTIMATE 83*    Basic Metabolic Panel: Recent Labs  Lab 02/07/20 0353 02/08/20 0234 02/09/20 0201 02/10/20 0308 02/11/20 0209  NA 128* 128* 132* 135 132*  K 4.6 4.2 4.4 5.2* 3.8  CL 99 99 102 106 105  CO2 20* 20* 17* 15* 19*  GLUCOSE 180* 212* 76 208* 102*  BUN 24* 27* 38* 31* 21  CREATININE 0.76 0.90 1.88* 1.32* 0.77  CALCIUM 7.6* 7.6* 8.0* 7.5* 7.4*  MG 2.3 2.3 2.0 2.1 2.1   PHOS  --   --   --  4.0  --    GFR: Estimated Creatinine Clearance: 75.8 mL/min (by C-G formula based on SCr of 0.77 mg/dL). Recent Labs  Lab 02/08/20 0234 02/09/20 0201 02/09/20 0806 02/10/20 0308 02/11/20 0209  PROCALCITON  --   --  2.89  --   --   WBC 12.9* 30.4*  --  24.3* 15.6*    Liver Function Tests: No results for input(s): AST, ALT, ALKPHOS, BILITOT, PROT, ALBUMIN in the last 168 hours. No results for input(s): LIPASE, AMYLASE in the last 168 hours. Recent Labs  Lab 02/09/20 0959  AMMONIA 30    ABG    Component Value Date/Time   PHART 7.483 (H) 01/31/2020 0350   PCO2ART 26.2 (L) 01/31/2020 0350   PO2ART 188 (H) 01/31/2020 0350   HCO3 18.8 (L) 02/08/2020 2133   ACIDBASEDEF 5.2 (H) 02/08/2020 2133   O2SAT 86.2 02/08/2020 2133     Coagulation Profile: No results for input(s): INR, PROTIME in the last 168 hours.  Cardiac Enzymes: No results for input(s): CKTOTAL, CKMB, CKMBINDEX, TROPONINI in the last 168 hours.  HbA1C: No results found for: HGBA1C  CBG: Recent Labs  Lab 02/10/20 0756 02/10/20 1107 02/10/20 1551 02/10/20 2107 02/11/20 0728  GLUCAP 165* 153* 138* 115* 87     CRITICAL CARE Performed by: Lesia Sago Kimberley Dastrup   Total critical care time: 32 minutes  Critical care time was exclusive of separately billable procedures and treating other patients.  Critical care was necessary to treat or prevent imminent or life-threatening deterioration.  Critical care was time spent personally by me on the following activities: development of treatment plan with patient and/or surrogate as well as nursing, discussions with consultants, evaluation of patient's response to treatment, examination of patient, obtaining history from patient or surrogate, ordering and performing treatments and interventions, ordering and review of laboratory studies, ordering and review of radiographic studies, pulse oximetry and re-evaluation of patient's condition.

## 2020-02-11 NOTE — Progress Notes (Signed)
  Echocardiogram 2D Echocardiogram has been performed.  Javier Beard 02/11/2020, 3:38 PM

## 2020-02-12 ENCOUNTER — Inpatient Hospital Stay (HOSPITAL_COMMUNITY): Payer: Medicare Other

## 2020-02-12 DIAGNOSIS — J9601 Acute respiratory failure with hypoxia: Secondary | ICD-10-CM

## 2020-02-12 DIAGNOSIS — U071 COVID-19: Secondary | ICD-10-CM | POA: Diagnosis not present

## 2020-02-12 DIAGNOSIS — J1282 Pneumonia due to coronavirus disease 2019: Secondary | ICD-10-CM | POA: Diagnosis not present

## 2020-02-12 DIAGNOSIS — L8990 Pressure ulcer of unspecified site, unspecified stage: Secondary | ICD-10-CM

## 2020-02-12 DIAGNOSIS — R0603 Acute respiratory distress: Secondary | ICD-10-CM

## 2020-02-12 DIAGNOSIS — B9561 Methicillin susceptible Staphylococcus aureus infection as the cause of diseases classified elsewhere: Secondary | ICD-10-CM

## 2020-02-12 DIAGNOSIS — R7881 Bacteremia: Secondary | ICD-10-CM | POA: Diagnosis not present

## 2020-02-12 LAB — CBC
HCT: 30.3 % — ABNORMAL LOW (ref 39.0–52.0)
Hemoglobin: 9.7 g/dL — ABNORMAL LOW (ref 13.0–17.0)
MCH: 30.5 pg (ref 26.0–34.0)
MCHC: 32 g/dL (ref 30.0–36.0)
MCV: 95.3 fL (ref 80.0–100.0)
Platelets: 63 10*3/uL — ABNORMAL LOW (ref 150–400)
RBC: 3.18 MIL/uL — ABNORMAL LOW (ref 4.22–5.81)
RDW: 15.7 % — ABNORMAL HIGH (ref 11.5–15.5)
WBC: 9.8 10*3/uL (ref 4.0–10.5)
nRBC: 0 % (ref 0.0–0.2)

## 2020-02-12 LAB — BASIC METABOLIC PANEL
Anion gap: 8 (ref 5–15)
BUN: 18 mg/dL (ref 8–23)
CO2: 18 mmol/L — ABNORMAL LOW (ref 22–32)
Calcium: 7.5 mg/dL — ABNORMAL LOW (ref 8.9–10.3)
Chloride: 104 mmol/L (ref 98–111)
Creatinine, Ser: 0.69 mg/dL (ref 0.61–1.24)
GFR, Estimated: >60 mL/min (ref >60)
Glucose, Bld: 127 mg/dL — ABNORMAL HIGH (ref 70–99)
Potassium: 3.8 mmol/L (ref 3.5–5.1)
Sodium: 130 mmol/L — ABNORMAL LOW (ref 135–145)

## 2020-02-12 LAB — GLUCOSE, CAPILLARY
Glucose-Capillary: 110 mg/dL — ABNORMAL HIGH (ref 70–99)
Glucose-Capillary: 87 mg/dL (ref 70–99)
Glucose-Capillary: 158 mg/dL — ABNORMAL HIGH (ref 70–99)
Glucose-Capillary: 153 mg/dL — ABNORMAL HIGH (ref 70–99)

## 2020-02-12 LAB — MAGNESIUM: Magnesium: 2.1 mg/dL (ref 1.7–2.4)

## 2020-02-12 LAB — C-REACTIVE PROTEIN: CRP: 11.3 mg/dL — ABNORMAL HIGH (ref <1.0)

## 2020-02-12 IMAGING — MR MR THORACIC SPINE WO/W CM
5 of 9 series · 24 of 48 positions shown · IV contrast (gadavist)
Comparison: None.

CLINICAL DATA: Epidural abscess.

EXAM:
MRI CERVICAL AND THORACIC SPINE WITHOUT AND WITH CONTRAST
TECHNIQUE: Multiplanar and multiecho pulse sequences of the cervical spine, to
include the craniocervical junction and cervicothoracic junction,
and the thoracic spine, were obtained without and with intravenous
contrast.
CONTRAST:  8mL GADAVIST GADOBUTROL 1 MMOL/ML IV SOLN

[Series 19: T1 · sagittal · 6.0mm · 1.23mm/px · 4 of 14 slices shown (1 of 3)]
[im 1/14]
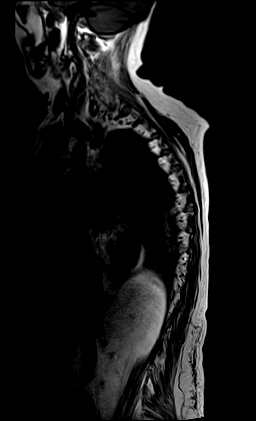
[im 5/14]
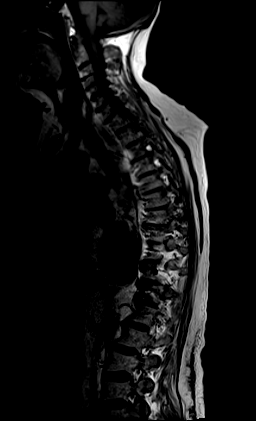
[im 9/14]
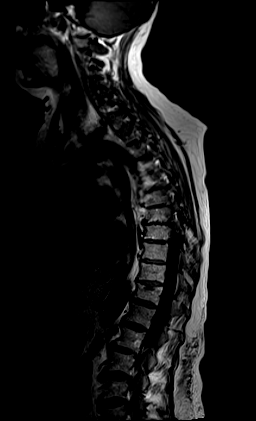
[im 14/14]
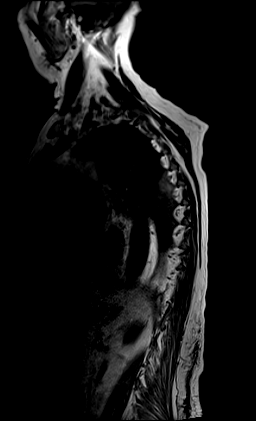

[Series 20: T2 · sagittal · 3.0mm · 0.76mm/px · 4 of 20 slices shown (1 of 2)]
[im 1/20]
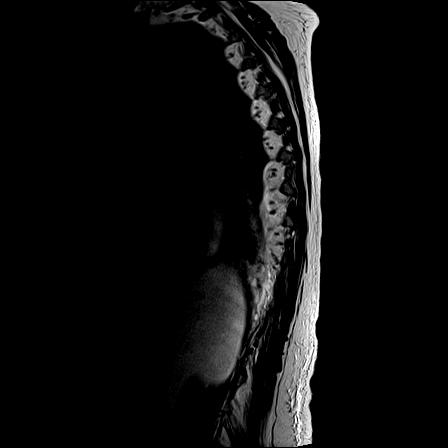
[im 7/20]
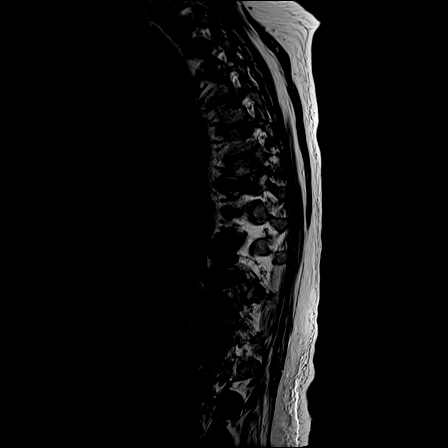
[im 13/20]
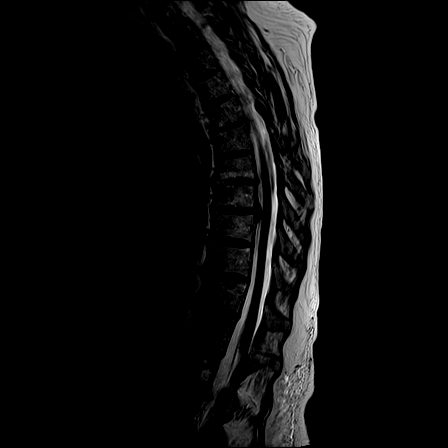
[im 20/20]
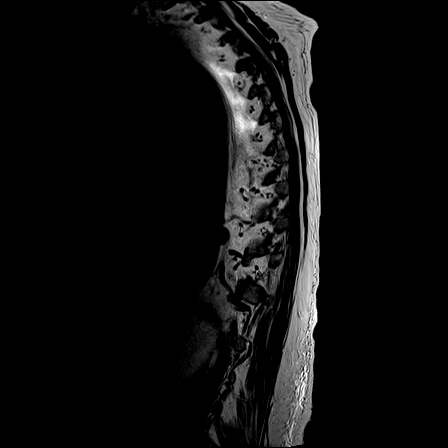

[Series 21: T1 · sagittal · 3.0mm · 0.76mm/px · 4 of 20 slices shown (2 of 3)]
[im 1/20]
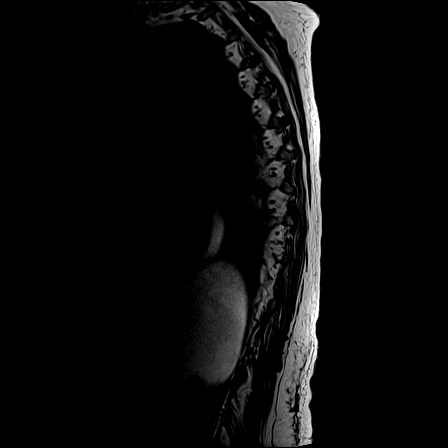
[im 7/20]
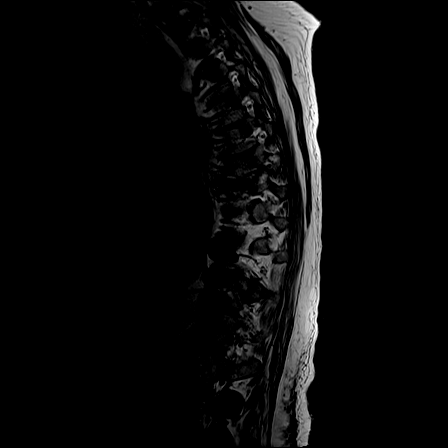
[im 13/20]
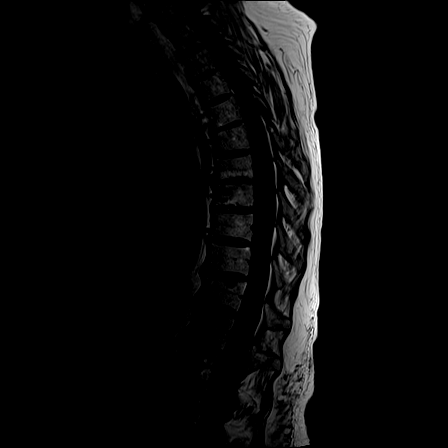
[im 20/20]
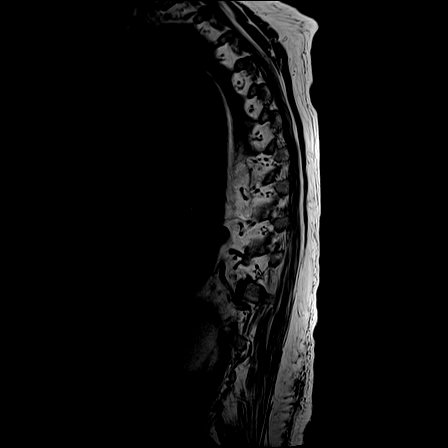

[Series 23: T2 · axial · 4.0mm · 0.59mm/px · z∈[-213,-26]mm · 7 of 36 slices shown (2 of 2)]
[im 1/36]
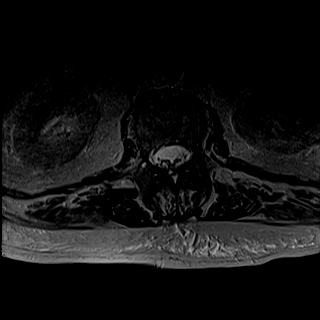
[im 6/36]
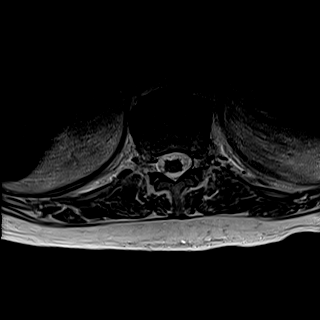
[im 12/36]
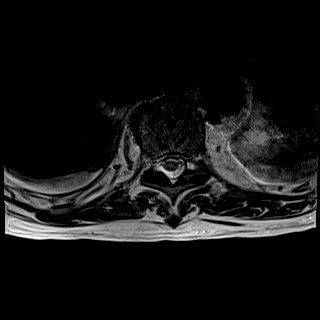
[im 18/36]
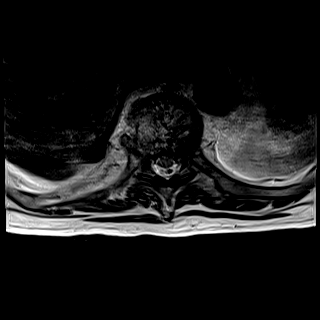
[im 24/36]
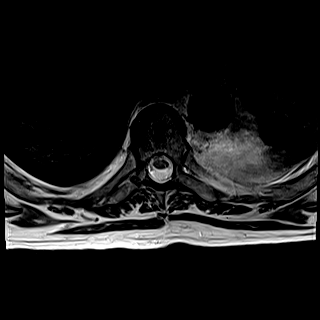
[im 30/36]
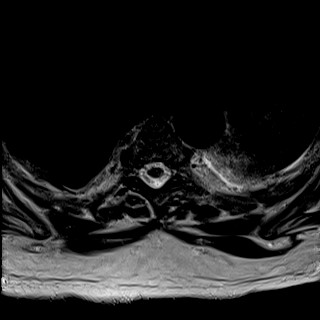
[im 36/36]
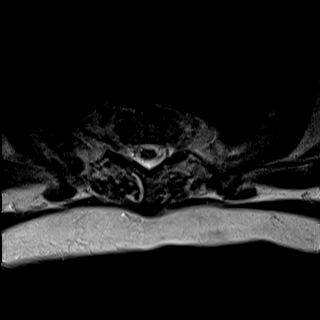

[Series 25: T1 · axial · non-contrast · 4.0mm · 0.31mm/px · z∈[-213,-82]mm · 5 of 36 slices shown (3 of 3)]
[im 1/36]
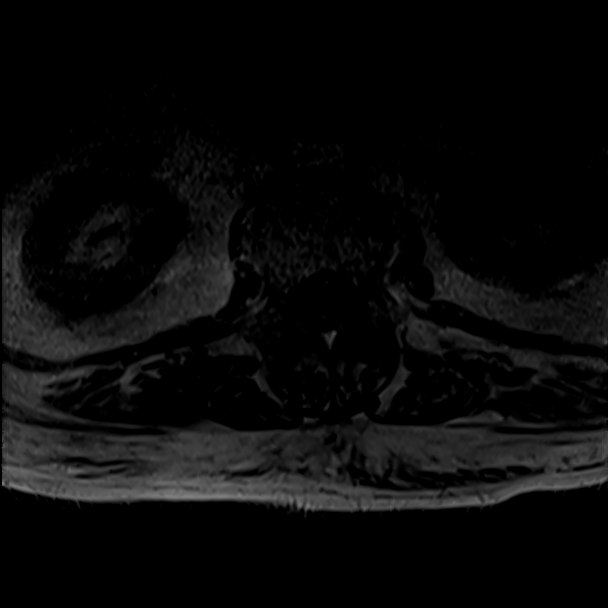
[im 6/36]
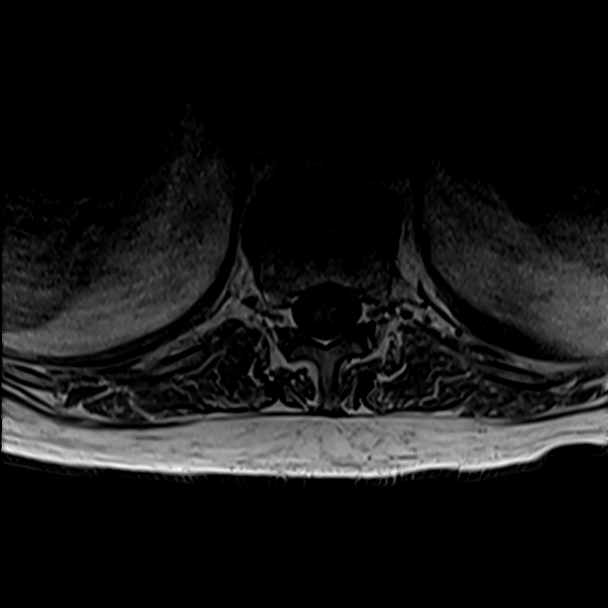
[im 12/36]
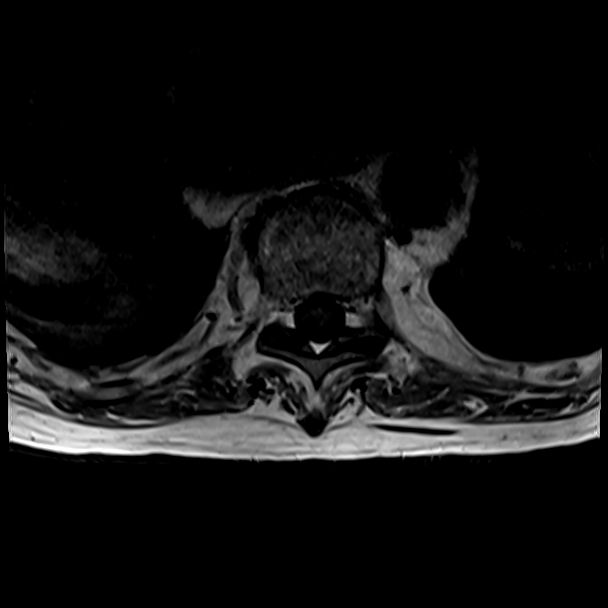
[im 18/36]
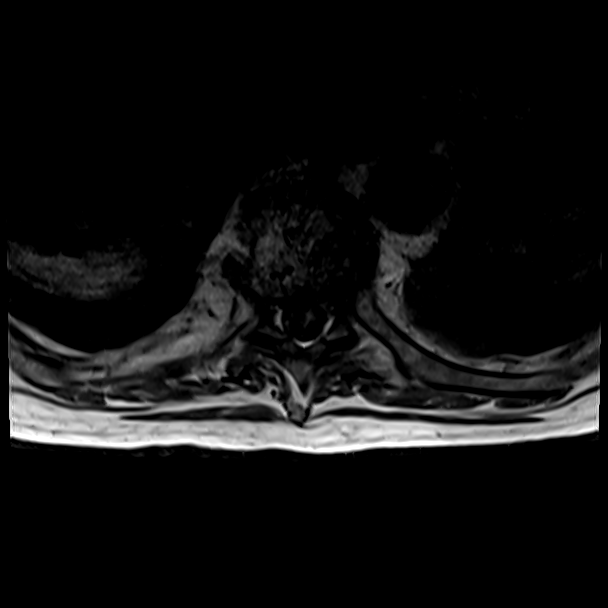
[im 24/36]
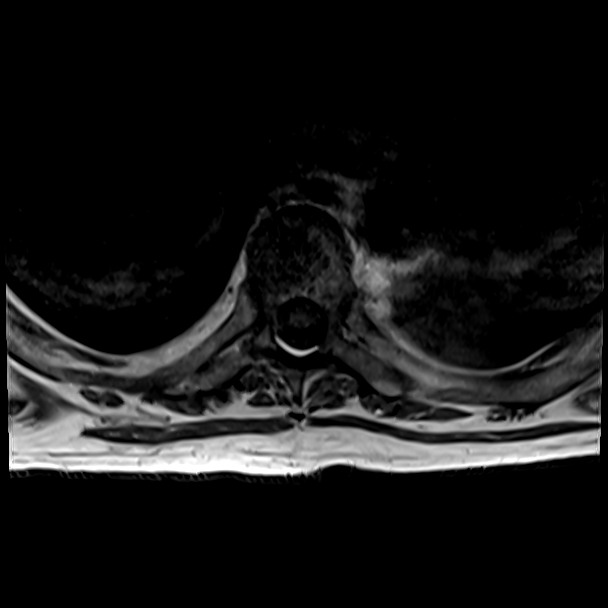

[24 of 48 positions shown; findings below may reference images not displayed]

FINDINGS: MRI CERVICAL SPINE FINDINGS

Images are significantly degraded by motion, particularly axial
views.

Alignment: Straightening of the cervical curvature. Small
anterolisthesis of C2 over C3 and small retrolisthesis of C3 over
C4.

Vertebrae: No fracture, evidence of discitis, or bone lesion.

Cord: No gross cord signal abnormality.

Posterior Fossa, vertebral arteries, paraspinal tissues: Negative.

Disc levels:

C2-3: Facet degenerative changes resulting mild left neural
foraminal narrowing. No spinal canal stenosis.

C3-4: Posterior disc protrusion resulting in mild spinal canal
stenosis. Uncovertebral and facet degenerative change resulting in
moderate bilateral neural foraminal narrowing.

C4-5: Posterior disc protrusion resulting in mild spinal canal
stenosis. Uncovertebral and facet degenerative changes resulting in
severe right and moderate left neural foraminal narrowing.

C5-6: Posterior disc protrusion resulting mild spinal canal
stenosis. Uncovertebral and facet degenerative changes resulting in
moderate bilateral neural foraminal narrowing.

C6-7: Posterior disc protrusion resulting mild spinal canal
stenosis. Uncovertebral and facet degenerative changes resulting in
mild bilateral neural foraminal narrowing.

C7-T1: No spinal canal or neural foraminal stenosis.

MRI THORACIC SPINE FINDINGS

Alignment:  Mildly exaggerated thoracic kyphosis.

Vertebrae: Endplate irregularity and sclerotic changes at T7-8 with
T2 hyperintensity and contrast enhancement of the corresponding
intervertebral disc. Mild chronic wedging of T6 and T11.

Cord:  Normal signal and morphology.

Paraspinal and other soft tissues: Bilateral pleural effusions, left
greater than right.

Disc levels:

T1-2: No spinal canal or neural foraminal stenosis.

T2-3:  No spinal canal or neural foraminal stenosis.

T3-4:  No spinal canal or neural foraminal stenosis.

T4-5: Posterior disc protrusion without significant spinal canal or
neural foraminal stenosis.

T5-6: Left posterolateral disc protrusion without significant spinal
canal or neural foraminal stenosis.

T6-7:  No spinal canal or neural foraminal stenosis.

T7-8: Posterior disc protrusion resulting mild bilateral neural
foraminal narrowing. No significant spinal canal stenosis.

T8-9: Left posterolateral disc protrude causing indentation on the
thecal sac, abutting the anterior cord surface resulting in mild
left neural foraminal narrowing. No significant spinal canal
stenosis.

T9-10:  No spinal canal or neural foraminal stenosis.

T10-11:  No spinal canal or neural foraminal stenosis.

T11-12:  No spinal canal or neural foraminal stenosis.
IMPRESSION: 1. Motion degraded study, particularly cervical axial images.
2. Endplate irregularity and sclerotic changes at T7-8 with T2
hyperintensity and contrast enhancement of the corresponding
intervertebral disc, may be degenerative versus
discitis/osteomyelitis.
3. Multilevel degenerative changes of the cervical spine with mild
spinal canal stenosis from C3-4 through C6-7.
4. No high-grade spinal canal stenosis at any level.
5. Bilateral pleural effusions, left greater than right.

## 2020-02-12 IMAGING — MR MR CERVICAL SPINE WO/W CM
5 of 8 series · 26 of 48 positions shown · IV contrast (gadavist)
Comparison: None.

CLINICAL DATA: Epidural abscess.

EXAM:
MRI CERVICAL AND THORACIC SPINE WITHOUT AND WITH CONTRAST
TECHNIQUE: Multiplanar and multiecho pulse sequences of the cervical spine, to
include the craniocervical junction and cervicothoracic junction,
and the thoracic spine, were obtained without and with intravenous
contrast.
CONTRAST:  8mL GADAVIST GADOBUTROL 1 MMOL/ML IV SOLN

[Series 5: T2 · sagittal · 3.0mm · 0.69mm/px · 3 of 15 slices shown (1 of 2)]
[im 1/15]
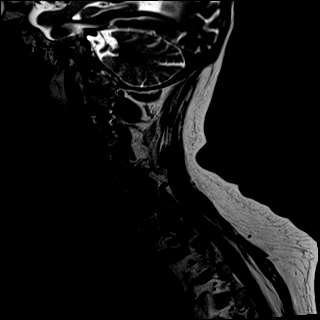
[im 8/15]
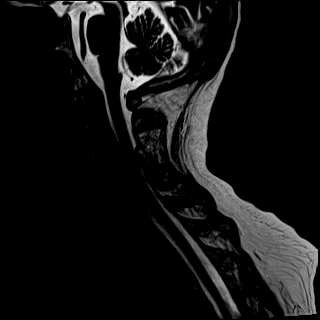
[im 15/15]
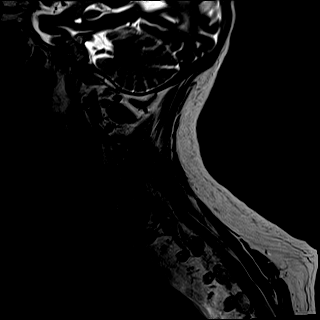

[Series 6: T1 · sagittal · 3.0mm · 0.69mm/px · 3 of 15 slices shown (1 of 2)]
[im 1/15]
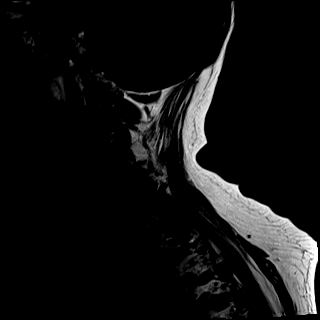
[im 8/15]
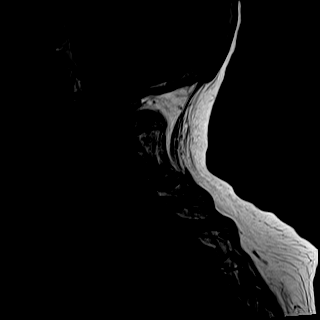
[im 15/15]
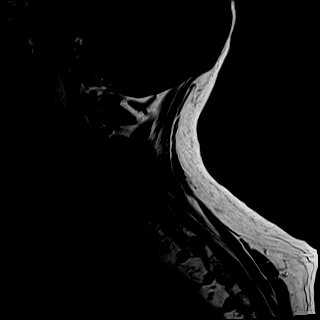

[Series 10: T1 · axial · 3.0mm · 0.39mm/px · z∈[-26,+89]mm · 9 of 40 slices shown (2 of 2)]
[im 1/40]
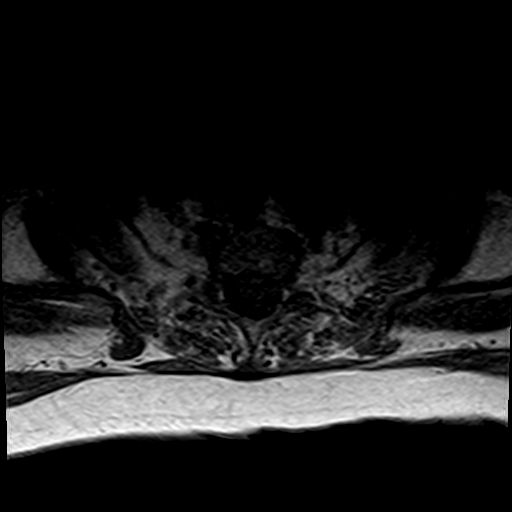
[im 5/40]
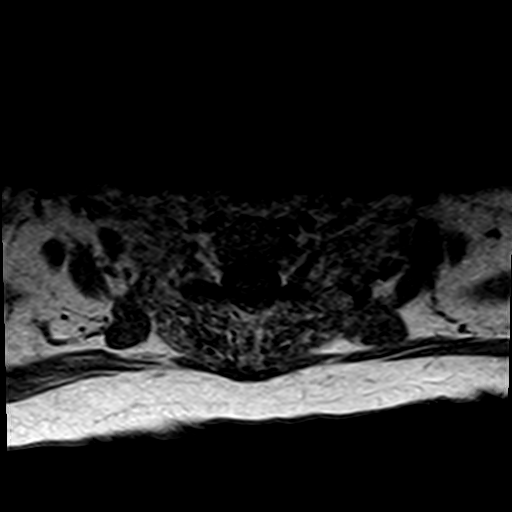
[im 10/40]
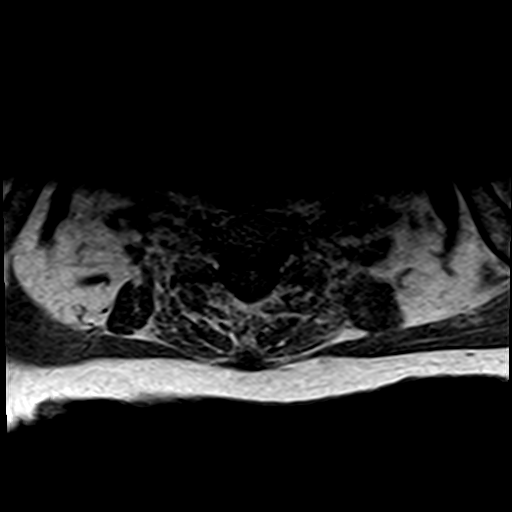
[im 15/40]
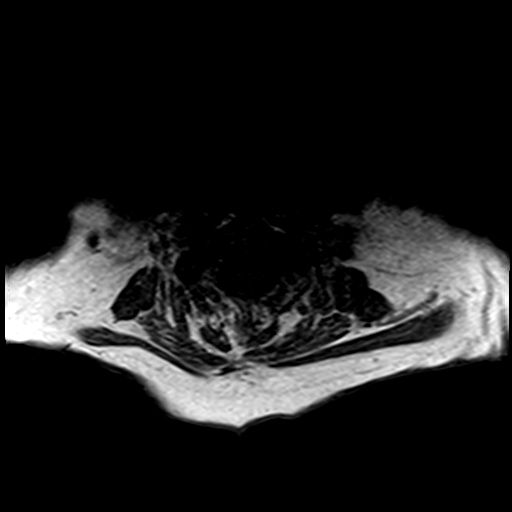
[im 20/40]
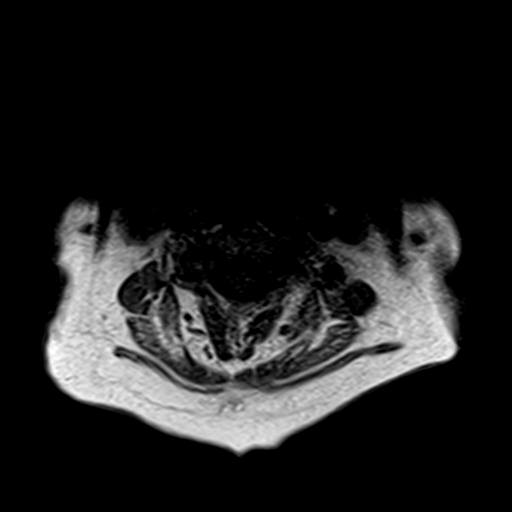
[im 25/40]
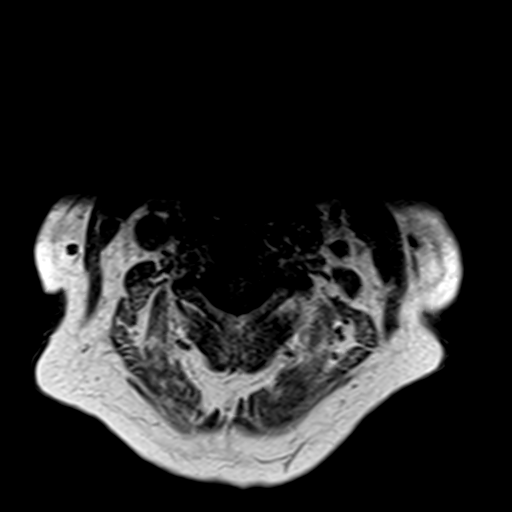
[im 30/40]
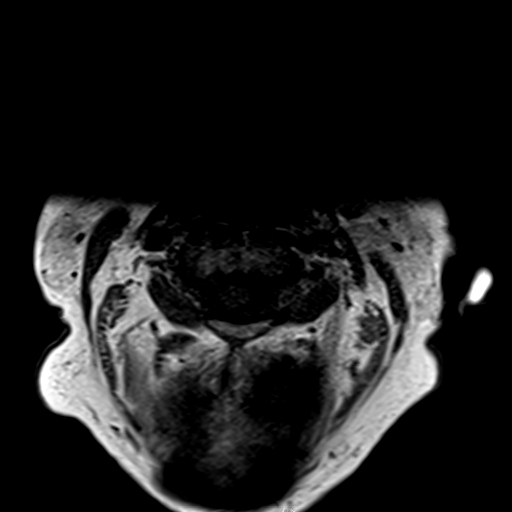
[im 35/40]
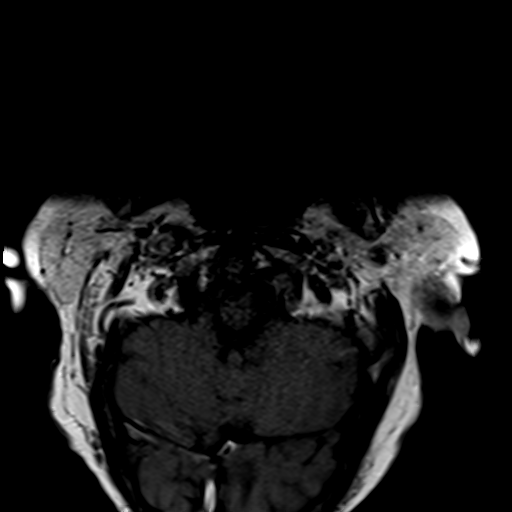
[im 40/40]
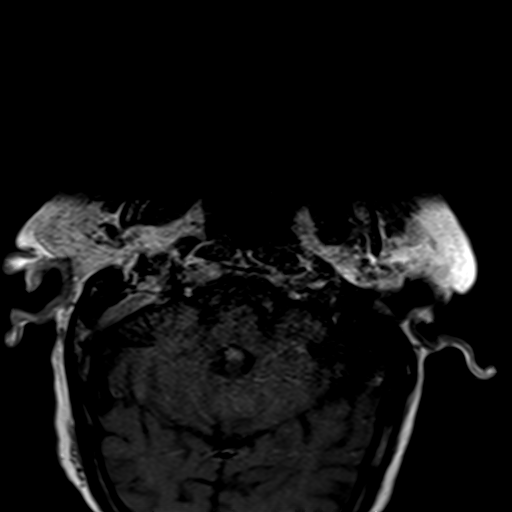

[Series 11: T1 fat-sat post-contrast · sagittal · 3.0mm · 0.43mm/px · 2 of 15 slices shown]
[im 1/15]
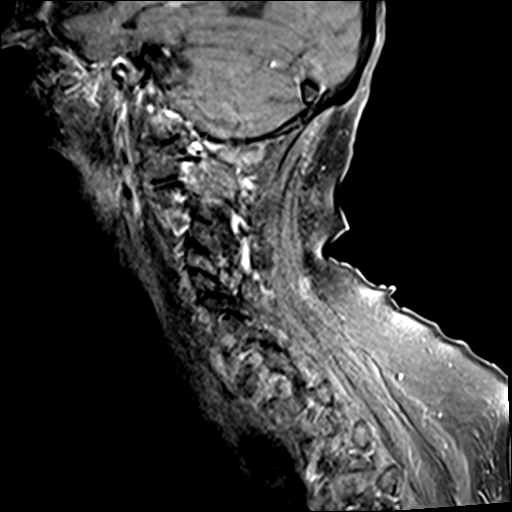
[im 8/15]
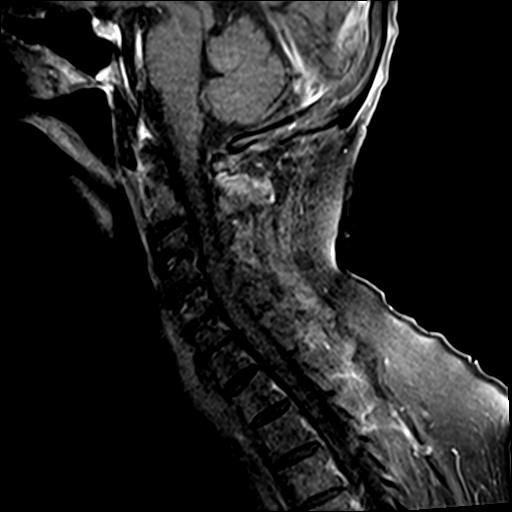

[Series 16: T2 · axial · 3.0mm · 0.66mm/px · z∈[-26,+89]mm · 9 of 40 slices shown (2 of 2)]
[im 1/40]
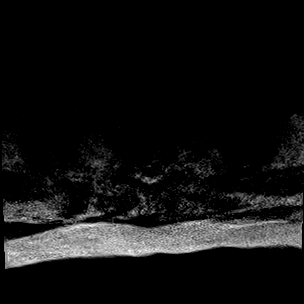
[im 5/40]
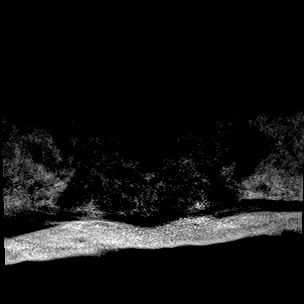
[im 10/40]
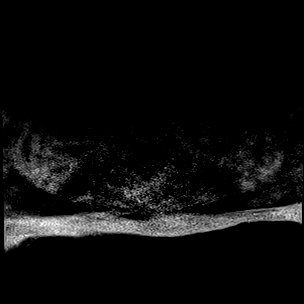
[im 15/40]
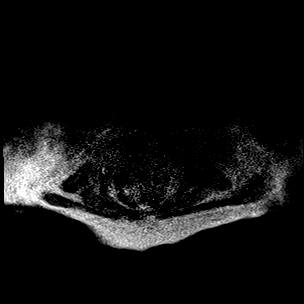
[im 20/40]
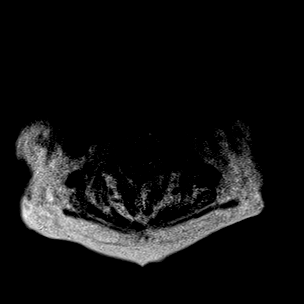
[im 25/40]
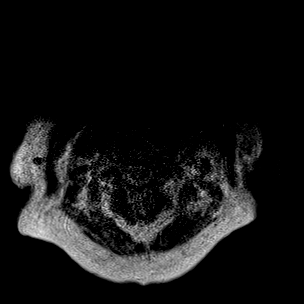
[im 30/40]
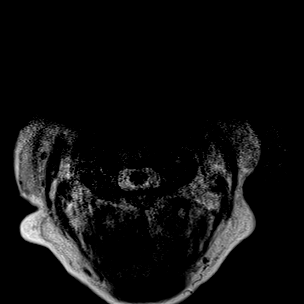
[im 35/40]
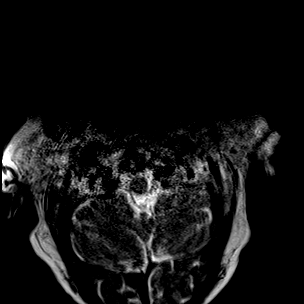
[im 40/40]
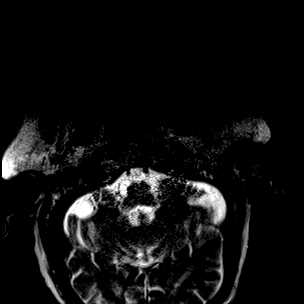

[26 of 48 positions shown; findings below may reference images not displayed]

FINDINGS: MRI CERVICAL SPINE FINDINGS

Images are significantly degraded by motion, particularly axial
views.

Alignment: Straightening of the cervical curvature. Small
anterolisthesis of C2 over C3 and small retrolisthesis of C3 over
C4.

Vertebrae: No fracture, evidence of discitis, or bone lesion.

Cord: No gross cord signal abnormality.

Posterior Fossa, vertebral arteries, paraspinal tissues: Negative.

Disc levels:

C2-3: Facet degenerative changes resulting mild left neural
foraminal narrowing. No spinal canal stenosis.

C3-4: Posterior disc protrusion resulting in mild spinal canal
stenosis. Uncovertebral and facet degenerative change resulting in
moderate bilateral neural foraminal narrowing.

C4-5: Posterior disc protrusion resulting in mild spinal canal
stenosis. Uncovertebral and facet degenerative changes resulting in
severe right and moderate left neural foraminal narrowing.

C5-6: Posterior disc protrusion resulting mild spinal canal
stenosis. Uncovertebral and facet degenerative changes resulting in
moderate bilateral neural foraminal narrowing.

C6-7: Posterior disc protrusion resulting mild spinal canal
stenosis. Uncovertebral and facet degenerative changes resulting in
mild bilateral neural foraminal narrowing.

C7-T1: No spinal canal or neural foraminal stenosis.

MRI THORACIC SPINE FINDINGS

Alignment:  Mildly exaggerated thoracic kyphosis.

Vertebrae: Endplate irregularity and sclerotic changes at T7-8 with
T2 hyperintensity and contrast enhancement of the corresponding
intervertebral disc. Mild chronic wedging of T6 and T11.

Cord:  Normal signal and morphology.

Paraspinal and other soft tissues: Bilateral pleural effusions, left
greater than right.

Disc levels:

T1-2: No spinal canal or neural foraminal stenosis.

T2-3:  No spinal canal or neural foraminal stenosis.

T3-4:  No spinal canal or neural foraminal stenosis.

T4-5: Posterior disc protrusion without significant spinal canal or
neural foraminal stenosis.

T5-6: Left posterolateral disc protrusion without significant spinal
canal or neural foraminal stenosis.

T6-7:  No spinal canal or neural foraminal stenosis.

T7-8: Posterior disc protrusion resulting mild bilateral neural
foraminal narrowing. No significant spinal canal stenosis.

T8-9: Left posterolateral disc protrude causing indentation on the
thecal sac, abutting the anterior cord surface resulting in mild
left neural foraminal narrowing. No significant spinal canal
stenosis.

T9-10:  No spinal canal or neural foraminal stenosis.

T10-11:  No spinal canal or neural foraminal stenosis.

T11-12:  No spinal canal or neural foraminal stenosis.
IMPRESSION: 1. Motion degraded study, particularly cervical axial images.
2. Endplate irregularity and sclerotic changes at T7-8 with T2
hyperintensity and contrast enhancement of the corresponding
intervertebral disc, may be degenerative versus
discitis/osteomyelitis.
3. Multilevel degenerative changes of the cervical spine with mild
spinal canal stenosis from C3-4 through C6-7.
4. No high-grade spinal canal stenosis at any level.
5. Bilateral pleural effusions, left greater than right.

## 2020-02-12 MED ORDER — PREDNISONE 5 MG PO TABS: 5.0000 mg | ORAL_TABLET | Freq: Every day | ORAL | Status: DC

## 2020-02-12 MED ORDER — PREDNISONE 5 MG PO TABS
10.0000 mg | ORAL_TABLET | Freq: Every day | ORAL | Status: DC
  Administered 2020-02-18 – 2020-02-21 (×4): 10 mg via ORAL
  Filled 2020-02-12 (×4): qty 2

## 2020-02-12 MED ORDER — DOCUSATE SODIUM 50 MG/5ML PO LIQD
100.0000 mg | Freq: Two times a day (BID) | ORAL | Status: DC
  Administered 2020-02-12 – 2020-02-13 (×2): 100 mg via ORAL
  Filled 2020-02-12 (×2): qty 10

## 2020-02-12 MED ORDER — GADOBUTROL 1 MMOL/ML IV SOLN
8.0000 mL | Freq: Once | INTRAVENOUS | Status: AC | PRN
  Administered 2020-02-12: 8 mL via INTRAVENOUS

## 2020-02-12 MED ORDER — PREDNISONE 20 MG PO TABS: 20.0000 mg | ORAL_TABLET | Freq: Every day | ORAL | Status: DC

## 2020-02-12 MED ORDER — PREDNISONE 20 MG PO TABS
20.0000 mg | ORAL_TABLET | Freq: Every day | ORAL | Status: AC
  Administered 2020-02-13 – 2020-02-17 (×4): 20 mg via ORAL
  Filled 2020-02-12 (×5): qty 1

## 2020-02-12 MED ORDER — POLYETHYLENE GLYCOL 3350 17 G PO PACK
17.0000 g | PACK | Freq: Every day | ORAL | Status: DC
  Administered 2020-02-12 – 2020-02-20 (×5): 17 g via ORAL
  Filled 2020-02-12 (×6): qty 1

## 2020-02-12 MED ORDER — ENSURE ENLIVE PO LIQD
237.0000 mL | Freq: Three times a day (TID) | ORAL | Status: DC
  Administered 2020-02-13 – 2020-02-14 (×2): 237 mL via ORAL

## 2020-02-12 NOTE — Progress Notes (Signed)
Subjective:  Neck and back pain  Antibiotics:  Anti-infectives (From admission, onward)   Start     Dose/Rate Route Frequency Ordered Stop   02/09/20 2300  ceFAZolin (ANCEF) IVPB 2g/100 mL premix        2 g 200 mL/hr over 30 Minutes Intravenous Every 8 hours 02/09/20 2233     02/09/20 1000  ceFEPIme (MAXIPIME) 1 g in sodium chloride 0.9 % 100 mL IVPB  Status:  Discontinued        1 g 200 mL/hr over 30 Minutes Intravenous Every 12 hours 02/09/20 0846 02/09/20 2233   02/09/20 1000  linezolid (ZYVOX) IVPB 600 mg  Status:  Discontinued        600 mg 300 mL/hr over 60 Minutes Intravenous Every 12 hours 02/09/20 0846 02/09/20 2233   01/27/20 1000  remdesivir 100 mg in sodium chloride 0.9 % 100 mL IVPB        100 mg 200 mL/hr over 30 Minutes Intravenous Daily 02/11/20 1602 01/30/20 1418   01/27/20 1000  hydroxychloroquine (PLAQUENIL) tablet 200 mg        200 mg Oral 2 times daily 01/27/20 0358     2020/02/11 1630  remdesivir 100 mg in sodium chloride 0.9 % 100 mL IVPB        100 mg 200 mL/hr over 30 Minutes Intravenous Every 30 min February 11, 2020 1602 2020/02/11 1745      Medications: Scheduled Meds: . apixaban  5 mg Oral BID  . vitamin C  500 mg Oral Daily  . calcium carbonate  1,250 mg Oral Q breakfast  . calcium carbonate  0.5 tablet Oral Q supper  . chlorhexidine  15 mL Mouth Rinse BID  . Chlorhexidine Gluconate Cloth  6 each Topical Daily  . docusate  100 mg Oral BID  . doxazosin  1 mg Oral QHS  . folic acid  1 mg Oral Daily  . glipiZIDE  2.5 mg Oral QAC breakfast  . hydroxychloroquine  200 mg Oral BID  . insulin aspart  0-20 Units Subcutaneous TID WC  . insulin aspart  0-5 Units Subcutaneous QHS  . mouth rinse  15 mL Mouth Rinse BID  . pantoprazole  40 mg Oral Daily  . polyethylene glycol  17 g Oral Daily  . pravastatin  80 mg Oral q1800  . [START ON 02/13/2020] predniSONE  20 mg Oral Q breakfast   Followed by  . [START ON 02/18/2020] predniSONE  10 mg Oral Q  breakfast   Followed by  . [START ON 02/23/2020] predniSONE  5 mg Oral Q breakfast  . vitamin B-12  1,000 mcg Oral Daily  . zinc sulfate  220 mg Oral Daily   Continuous Infusions: . sodium chloride Stopped (02/12/20 0756)  .  ceFAZolin (ANCEF) IV 200 mL/hr at 02/12/20 0800   PRN Meds:.acetaminophen **OR** acetaminophen, senna-docusate    Objective: Weight change:   Intake/Output Summary (Last 24 hours) at 02/12/2020 1402 Last data filed at 02/12/2020 1300 Gross per 24 hour  Intake 1074.85 ml  Output 700 ml  Net 374.85 ml   Blood pressure 113/68, pulse (!) 110, temperature 98.9 F (37.2 C), temperature source Oral, resp. rate (!) 28, height 5\' 6"  (1.676 m), weight 79.6 kg, SpO2 95 %. Temp:  [97.2 F (36.2 C)-98.9 F (37.2 C)] 98.9 F (37.2 C) (01/24 0700) Pulse Rate:  [69-110] 110 (01/24 1340) Resp:  [18-36] 28 (01/24 1340) BP: (91-142)/(61-96) 113/68 (01/24 1325) SpO2:  [91 %-100 %]  95 % (01/24 1325) FiO2 (%):  [40 %] 40 % (01/24 0000) Weight:  [79.6 kg] 79.6 kg (01/24 0412)  Physical Exam: Physical Exam HENT:     Head: Normocephalic and atraumatic.  Eyes:     Extraocular Movements: Extraocular movements intact.  Cardiovascular:     Rate and Rhythm: Rhythm irregular.     Heart sounds: Murmur heard.    Pulmonary:     Effort: No respiratory distress.     Breath sounds: Rhonchi present.  Abdominal:     General: Bowel sounds are normal. There is no distension.  Musculoskeletal:        General: Normal range of motion.       Feet:  Feet:     Right foot:     Skin integrity: Callus present. No ulcer or blister.     Left foot:     Skin integrity: No ulcer, blister, skin breakdown or erythema.  Skin:    General: Skin is warm.  Neurological:     General: No focal deficit present.     Mental Status: He is alert.       CBC:    BMET Recent Labs    02/11/20 0209 02/12/20 0418  NA 132* 130*  K 3.8 3.8  CL 105 104  CO2 19* 18*  GLUCOSE 102* 127*  BUN 21  18  CREATININE 0.77 0.69  CALCIUM 7.4* 7.5*     Liver Panel  No results for input(s): PROT, ALBUMIN, AST, ALT, ALKPHOS, BILITOT, BILIDIR, IBILI in the last 72 hours.     Sedimentation Rate No results for input(s): ESRSEDRATE in the last 72 hours. C-Reactive Protein Recent Labs    02/11/20 0209 02/12/20 0418  CRP 20.6* 11.3*    Micro Results: Recent Results (from the past 720 hour(s))  Resp Panel by RT-PCR (Flu A&B, Covid) Nasopharyngeal Swab     Status: Abnormal   Collection Time: 01/29/2020  1:42 PM   Specimen: Nasopharyngeal Swab; Nasopharyngeal(NP) swabs in vial transport medium  Result Value Ref Range Status   SARS Coronavirus 2 by RT PCR POSITIVE (A) NEGATIVE Final    Comment: RESULT CALLED TO, READ BACK BY AND VERIFIED WITH: SIMMS.M,RN @ 1547 ON 01-29-2020, [email or web address].P (NOTE) SARS-CoV-2 target nucleic acids are DETECTED.  The SARS-CoV-2 RNA is generally detectable in upper respiratory specimens during the acute phase of infection. Positive results are indicative of the presence of the identified virus, but do not rule out bacterial infection or co-infection with other pathogens not detected by the test. Clinical correlation with patient history and other diagnostic information is necessary to determine patient infection status. The expected result is Negative.  Fact Sheet for Patients: BloggerCourse.com  Fact Sheet for Healthcare Providers: SeriousBroker.it  This test is not yet approved or cleared by the [place] FDA and  has been authorized for detection and/or diagnosis of SARS-CoV-2 by FDA under an Emergency Use Authorization (EUA).  This EUA will remain in effect (meaning this  test can be used) for the duration of  the COVID-19 declaration under Section 564(b)(1) of the Act, 21 U.S.C. section 360bbb-3(b)(1), unless the authorization is terminated or revoked sooner.     Influenza A by PCR NEGATIVE  NEGATIVE Final   Influenza B by PCR NEGATIVE NEGATIVE Final    Comment: (NOTE) The Xpert Xpress SARS-CoV-2/FLU/RSV plus assay is intended as an aid in the diagnosis of influenza from Nasopharyngeal swab specimens and should not be used as a sole basis for treatment. Nasal washings and  aspirates are unacceptable for Xpert Xpress SARS-CoV-2/FLU/RSV testing.  Fact Sheet for Patients: BloggerCourse.com  Fact Sheet for Healthcare Providers: SeriousBroker.it  This test is not yet approved or cleared by the [place] FDA and has been authorized for detection and/or diagnosis of SARS-CoV-2 by FDA under an Emergency Use Authorization (EUA). This EUA will remain in effect (meaning this test can be used) for the duration of the COVID-19 declaration under Section 564(b)(1) of the Act, 21 U.S.C. section 360bbb-3(b)(1), unless the authorization is terminated or revoked.  Performed at [facility], [address] [place], [place] [address]   Blood Culture (routine x 2)     Status: None   Collection Time: 02-09-2020  4:11 PM   Specimen: BLOOD LEFT FOREARM  Result Value Ref Range Status   Specimen Description   Final    BLOOD LEFT FOREARM Performed at [facility], [address] [place], [place] [address]    Special Requests   Final    BOTTLES DRAWN AEROBIC AND ANAEROBIC Blood Culture adequate volume Performed at [facility], [address] [place], [place] [address]    Culture   Final    NO GROWTH 5 DAYS Performed at [facility], [address], [place], [place] [address]    Report Status 01/31/2020 FINAL  Final  Blood Culture (routine x 2)     Status: None   Collection Time: 02-09-2020  4:45 PM   Specimen: BLOOD RIGHT HAND  Result Value Ref Range Status   Specimen Description   Final    BLOOD RIGHT HAND Performed at [facility], [address], [place], [place] [address]    Special  Requests   Final    BOTTLES DRAWN AEROBIC AND ANAEROBIC Blood Culture adequate volume Performed at [facility], [address] [place], [place] [address]    Culture   Final    NO GROWTH 5 DAYS Performed at [facility], [address], [place], [place] [address]    Report Status 01/31/2020 FINAL  Final  Culture, blood (Routine X 2) w Reflex to ID Panel     Status: Abnormal   Collection Time: 02/09/20  8:10 AM   Specimen: BLOOD LEFT HAND  Result Value Ref Range Status   Specimen Description BLOOD LEFT HAND  Final   Special Requests   Final    BOTTLES DRAWN AEROBIC ONLY Blood Culture results may not be optimal due to an inadequate volume of blood received in culture bottles   Culture  Setup Time   Final    GRAM POSITIVE COCCI IN CLUSTERS AEROBIC BOTTLE ONLY CRITICAL VALUE NOTED.  VALUE IS CONSISTENT WITH PREVIOUSLY REPORTED AND CALLED VALUE.    Culture (A)  Final    STAPHYLOCOCCUS AUREUS SUSCEPTIBILITIES PERFORMED ON PREVIOUS CULTURE WITHIN THE LAST 5 DAYS. Performed at [facility], [address], [place], [place] [address]    Report Status 02/11/2020 FINAL  Final  Culture, blood (Routine X 2) w Reflex to ID Panel     Status: Abnormal   Collection Time: 02/09/20  8:22 AM   Specimen: BLOOD RIGHT HAND  Result Value Ref Range Status   Specimen Description BLOOD RIGHT HAND  Final   Special Requests   Final    BOTTLES DRAWN AEROBIC AND ANAEROBIC Blood Culture results may not be optimal due to an inadequate volume of blood received in culture bottles   Culture  Setup Time   Final    GRAM POSITIVE COCCI IN CLUSTERS IN BOTH AEROBIC AND ANAEROBIC BOTTLES Organism ID to follow CRITICAL RESULT CALLED TO, READ BACK BY AND VERIFIED WITH: C AMEND [facility] 02/09/20 2216 JDW Performed at [facility], [address], [place], [place] [address]    Culture STAPHYLOCOCCUS AUREUS (A)  Final   Report Status 02/11/2020 FINAL  Final   Organism ID, Bacteria  STAPHYLOCOCCUS AUREUS  Final      Susceptibility   Staphylococcus aureus - MIC*    CIPROFLOXACIN <=0.5 SENSITIVE Sensitive     ERYTHROMYCIN <=0.25 SENSITIVE Sensitive     GENTAMICIN <=0.5 SENSITIVE Sensitive     OXACILLIN 0.5 SENSITIVE Sensitive     TETRACYCLINE <=1 SENSITIVE Sensitive     VANCOMYCIN 1 SENSITIVE Sensitive     TRIMETH/SULFA <=10 SENSITIVE Sensitive     CLINDAMYCIN <=0.25 SENSITIVE Sensitive     RIFAMPIN <=0.5 SENSITIVE Sensitive     Inducible Clindamycin NEGATIVE Sensitive     * STAPHYLOCOCCUS AUREUS  Blood Culture ID Panel (Reflexed)     Status: Abnormal   Collection Time: 02/09/20  8:22 AM  Result Value Ref Range Status   Enterococcus faecalis NOT DETECTED NOT DETECTED Final   Enterococcus Faecium NOT DETECTED NOT DETECTED Final   Listeria monocytogenes NOT DETECTED NOT DETECTED Final   Staphylococcus species DETECTED (A) NOT DETECTED Final    Comment: CRITICAL RESULT CALLED TO, READ BACK BY AND VERIFIED WITH: C AMEND PHARMD 02/09/20 2216 JDW    Staphylococcus aureus (BCID) DETECTED (A) NOT DETECTED Final    Comment: CRITICAL RESULT CALLED TO, READ BACK BY AND VERIFIED WITH: C AMEND PHARMD 02/09/20 2216 JDW    Staphylococcus epidermidis NOT DETECTED NOT DETECTED Final   Staphylococcus lugdunensis NOT DETECTED NOT DETECTED Final   Streptococcus species NOT DETECTED NOT DETECTED Final   Streptococcus agalactiae NOT DETECTED NOT DETECTED Final   Streptococcus pneumoniae NOT DETECTED NOT DETECTED Final   Streptococcus pyogenes NOT DETECTED NOT DETECTED Final   A.calcoaceticus-baumannii NOT DETECTED NOT DETECTED Final   Bacteroides fragilis NOT DETECTED NOT DETECTED Final   Enterobacterales NOT DETECTED NOT DETECTED Final   Enterobacter cloacae complex NOT DETECTED NOT DETECTED Final   Escherichia coli NOT DETECTED NOT DETECTED Final   Klebsiella aerogenes NOT DETECTED NOT DETECTED Final   Klebsiella oxytoca NOT DETECTED NOT DETECTED Final   Klebsiella pneumoniae  NOT DETECTED NOT DETECTED Final   Proteus species NOT DETECTED NOT DETECTED Final   Salmonella species NOT DETECTED NOT DETECTED Final   Serratia marcescens NOT DETECTED NOT DETECTED Final   Haemophilus influenzae NOT DETECTED NOT DETECTED Final   Neisseria meningitidis NOT DETECTED NOT DETECTED Final   Pseudomonas aeruginosa NOT DETECTED NOT DETECTED Final   Stenotrophomonas maltophilia NOT DETECTED NOT DETECTED Final   Candida albicans NOT DETECTED NOT DETECTED Final   Candida auris NOT DETECTED NOT DETECTED Final   Candida glabrata NOT DETECTED NOT DETECTED Final   Candida krusei NOT DETECTED NOT DETECTED Final   Candida parapsilosis NOT DETECTED NOT DETECTED Final   Candida tropicalis NOT DETECTED NOT DETECTED Final   Cryptococcus neoformans/gattii NOT DETECTED NOT DETECTED Final   Meth resistant mecA/C and MREJ NOT DETECTED NOT DETECTED Final    Comment: Performed at [facility], [address] [place], [place] [address]  MRSA PCR Screening     Status: None   Collection Time: 02/09/20  9:45 AM   Specimen: Nasal Mucosa; Nasopharyngeal  Result Value Ref  Range Status   MRSA by PCR NEGATIVE NEGATIVE Final    Comment:        The GeneXpert MRSA Assay (FDA approved for NASAL specimens only), is one component of a comprehensive MRSA colonization surveillance program. It is not intended to diagnose MRSA infection nor to guide or monitor treatment for MRSA infections. Performed at [facility], [address] [place], [place] [address]   Culture, blood (Routine X 2) w Reflex to ID Panel     Status: None (Preliminary result)   Collection Time: 02/10/20  3:30 AM   Specimen: BLOOD LEFT HAND  Result Value Ref Range Status   Specimen Description BLOOD LEFT HAND  Final   Special Requests   Final    AEROBIC BOTTLE ONLY Blood Culture results may not be optimal due to an inadequate volume of blood received in culture bottles   Culture  Setup Time   Final    GRAM POSITIVE  COCCI IN CLUSTERS AEROBIC BOTTLE ONLY CRITICAL VALUE NOTED.  VALUE IS CONSISTENT WITH PREVIOUSLY REPORTED AND CALLED VALUE.    Culture   Final    GRAM POSITIVE COCCI IDENTIFICATION TO FOLLOW Performed at [facility], [address] [place], [place] [address]    Report Status PENDING  Incomplete  Culture, blood (Routine X 2) w Reflex to ID Panel     Status: None (Preliminary result)   Collection Time: 02/10/20  6:37 PM   Specimen: BLOOD  Result Value Ref Range Status   Specimen Description BLOOD SITE NOT SPECIFIED  Final   Special Requests   Final    BOTTLES DRAWN AEROBIC AND ANAEROBIC Blood Culture adequate volume   Culture   Final    NO GROWTH < 24 HOURS Performed at [facility], [address] [place], [place] [address]    Report Status PENDING  Incomplete    Studies/Results: MR CERVICAL SPINE W WO CONTRAST  Result Date: 02/12/2020 CLINICAL DATA:  Epidural abscess. EXAM: MRI CERVICAL AND THORACIC SPINE WITHOUT AND WITH CONTRAST TECHNIQUE: Multiplanar and multiecho pulse sequences of the cervical spine, to include the craniocervical junction and cervicothoracic junction, and the thoracic spine, were obtained without and with intravenous contrast. CONTRAST:  8mL GADAVIST GADOBUTROL 1 MMOL/ML IV SOLN COMPARISON:  None. FINDINGS: MRI CERVICAL SPINE FINDINGS Images are significantly degraded by motion, particularly axial views. Alignment: Straightening of the cervical curvature. Small anterolisthesis of C2 over C3 and small retrolisthesis of C3 over C4. Vertebrae: No fracture, evidence of discitis, or bone lesion. Cord: No gross cord signal abnormality. Posterior Fossa, vertebral arteries, paraspinal tissues: Negative. Disc levels: C2-3: Facet degenerative changes resulting mild left neural foraminal narrowing. No spinal canal stenosis. C3-4: Posterior disc protrusion resulting in mild spinal canal stenosis. Uncovertebral and facet degenerative change resulting in moderate  bilateral neural foraminal narrowing. C4-5: Posterior disc protrusion resulting in mild spinal canal stenosis. Uncovertebral and facet degenerative changes resulting in severe right and moderate left neural foraminal narrowing. C5-6: Posterior disc protrusion resulting mild spinal canal stenosis. Uncovertebral and facet degenerative changes resulting in moderate bilateral neural foraminal narrowing. C6-7: Posterior disc protrusion resulting mild spinal canal stenosis. Uncovertebral and facet degenerative changes resulting in mild bilateral neural foraminal narrowing. C7-T1: No spinal canal or neural foraminal stenosis. MRI THORACIC SPINE FINDINGS Alignment:  Mildly exaggerated thoracic kyphosis. Vertebrae: Endplate irregularity and sclerotic changes at T7-8 with T2 hyperintensity and contrast enhancement of the corresponding intervertebral disc. Mild chronic wedging of T6 and T11. Cord:  Normal signal and morphology. Paraspinal and other soft tissues: Bilateral pleural effusions, left greater than right. Disc levels: T1-2: No spinal canal or neural foraminal stenosis. T2-3:  No spinal canal or neural foraminal stenosis. T3-4:  No spinal canal or neural foraminal stenosis. T4-5: Posterior disc protrusion without significant spinal canal or neural foraminal stenosis. T5-6: Left posterolateral disc protrusion without significant spinal canal or neural foraminal stenosis. T6-7:  No spinal canal or neural foraminal stenosis. T7-8: Posterior disc protrusion resulting mild bilateral neural foraminal narrowing. No significant spinal canal stenosis. T8-9: Left posterolateral disc protrude causing indentation on the thecal sac, abutting the anterior cord surface resulting in mild left neural foraminal narrowing. No significant spinal canal stenosis. T9-10:  No spinal canal or neural foraminal stenosis. T10-11:  No spinal canal or neural foraminal stenosis. T11-12:  No spinal canal or neural foraminal stenosis. IMPRESSION: 1.  Motion degraded study, particularly cervical axial images. 2. Endplate irregularity and sclerotic changes at T7-8 with T2 hyperintensity and contrast enhancement of the corresponding intervertebral disc, may be degenerative versus discitis/osteomyelitis. 3. 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 4. No high-grade spinal canal stenosis at any level. 5. Bilateral pleural effusions, left greater than right. Electronically Signed   By: Baldemar Lenis M.D.   On: 02/12/2020 12:59   MR THORACIC SPINE W WO CONTRAST  Result Date: 02/12/2020 CLINICAL DATA:  Epidural abscess. EXAM: MRI CERVICAL AND THORACIC SPINE WITHOUT AND WITH CONTRAST TECHNIQUE: Multiplanar and multiecho pulse sequences of the cervical spine, to include the craniocervical junction and cervicothoracic junction, and the thoracic spine, were obtained without and with intravenous contrast. CONTRAST:  8mL GADAVIST GADOBUTROL 1 MMOL/ML IV SOLN COMPARISON:  None. FINDINGS: MRI CERVICAL SPINE FINDINGS Images are significantly degraded by motion, particularly axial views. Alignment: Straightening of the cervical curvature. Small anterolisthesis of C2 over C3 and small retrolisthesis of C3 over C4. Vertebrae: No fracture, evidence of discitis, or bone lesion. Cord: No gross cord signal abnormality. Posterior Fossa, vertebral arteries, paraspinal tissues: Negative. Disc levels: C2-3: Facet degenerative changes resulting mild left neural foraminal narrowing. No spinal canal stenosis. C3-4: Posterior disc protrusion resulting in mild spinal canal stenosis. Uncovertebral and facet degenerative change resulting in moderate bilateral neural foraminal narrowing. C4-5: Posterior disc protrusion resulting in mild spinal canal stenosis. Uncovertebral and facet degenerative changes resulting in severe right and moderate left neural foraminal narrowing. C5-6: Posterior disc protrusion  resulting mild spinal canal stenosis. Uncovertebral and facet degenerative changes resulting in moderate bilateral neural foraminal narrowing. C6-7: Posterior disc protrusion resulting mild spinal canal stenosis. Uncovertebral and facet degenerative changes resulting in mild bilateral neural foraminal narrowing. C7-T1: No spinal canal or neural foraminal stenosis. MRI THORACIC SPINE FINDINGS Alignment:  Mildly exaggerated thoracic kyphosis. Vertebrae: Endplate irregularity and sclerotic changes at T7-8 with T2 hyperintensity and contrast enhancement of the corresponding intervertebral disc. Mild chronic wedging of T6 and T11. Cord:  Normal signal and morphology. Paraspinal and other soft tissues: Bilateral pleural effusions, left greater than right. Disc levels: T1-2: No spinal canal or neural foraminal stenosis. T2-3:  No spinal canal or neural foraminal stenosis. T3-4:  No spinal canal or neural foraminal stenosis. T4-5: Posterior disc protrusion without significant spinal canal or neural foraminal stenosis. T5-6: Left posterolateral disc protrusion without significant spinal canal or neural foraminal stenosis. T6-7:  No spinal canal or neural foraminal stenosis. T7-8: Posterior disc protrusion resulting mild bilateral neural foraminal narrowing. No significant spinal canal stenosis.  T8-9: Left posterolateral disc protrude causing indentation on the thecal sac, abutting the anterior cord surface resulting in mild left neural foraminal narrowing. No significant spinal canal stenosis. T9-10:  No spinal canal or neural foraminal stenosis. T10-11:  No spinal canal or neural foraminal stenosis. T11-12:  No spinal canal or neural foraminal stenosis. IMPRESSION: 1. Motion degraded study, particularly cervical axial images. 2. Endplate irregularity and sclerotic changes at T7-8 with T2 hyperintensity and contrast enhancement of the corresponding intervertebral disc, may be degenerative versus discitis/osteomyelitis. 3.  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 4. No high-grade spinal canal stenosis at any level. 5. Bilateral pleural effusions, left greater than right. Electronically Signed   By: Baldemar Lenis M.D.   On: 02/12/2020 12:59   ECHOCARDIOGRAM LIMITED  Result Date: 02/11/2020    ECHOCARDIOGRAM LIMITED REPORT   Patient Name:   Javier Beard Date of Exam: 02/11/2020 Medical Rec #:  [id number]     Height:       66.0 in Accession #:    [id number]    Weight:       170.9 lb Date of Birth:  1940-10-11     BSA:          1.870 m Patient Age:    79 years      BP:           142/84 mmHg Patient Gender: M             HR:           89 bpm. Exam Location:  Inpatient Procedure: Limited Echo Indications:    Bacteremia  History:        Patient has prior history of Echocardiogram examinations, most                 recent 02/02/2020. Covid 19 positive.  Sonographer:    Roosvelt Maser RDCS Referring Phys: [phone] MATTHEW R HUNSUCKER IMPRESSIONS  1. Left ventricular ejection fraction, by estimation, is 60 to 65%. The left ventricle has normal function.  2. Right ventricular systolic function is normal. The right ventricular size is mildly enlarged.  3. The mitral valve is normal in structure. Trivial mitral valve regurgitation.  4. Tricuspid valve regurgitation is moderate.  5. The aortic valve is tricuspid. There is mild calcification of the aortic valve. Aortic valve regurgitation is trivial. Comparison(s): A prior study was performed on 01/31/2020. Prior images reviewed side by side. Compared to both recent studies; Improvement in RV function from 02/02/20. Similar tricuspid regurgitation from 01/31/20. Vegetation not visualized in this study. FINDINGS  Left Ventricle: Left ventricular ejection fraction, by estimation, is 60 to 65%. The left ventricle has normal function. Right Ventricle: The right ventricular size is mildly enlarged. Right ventricular systolic  function is normal. Pericardium: There is no evidence of pericardial effusion. Mitral Valve: The mitral valve is normal in structure. Trivial mitral valve regurgitation. Tricuspid Valve: The tricuspid valve is grossly normal. Tricuspid valve regurgitation is moderate. Aortic Valve: The aortic valve is tricuspid. There is mild calcification of the aortic valve. There is moderate aortic valve annular calcification. Aortic valve regurgitation is trivial. Pulmonic Valve: The pulmonic valve was grossly normal. Pulmonic valve regurgitation is not visualized. Venous: The inferior vena cava was not well visualized. TRICUSPID VALVE TR Peak grad:   34.1 mmHg TR Vmax:        292.00 cm/s Riley Lam MD Electronically signed by Riley Lam MD Signature  Date/Time: 02/11/2020/5:33:39 PM    Final    [place] EKG SITE RITE  Result Date: 02/11/2020 If Site Rite image not attached, placement could not be confirmed due to current cardiac rhythm.     Assessment/Plan:  INTERVAL HISTORY:  Pt sp TTE   Principal Problem:   Acute hypoxemic respiratory failure due to COVID-19 [facility]) Active Problems:   Type 2 diabetes mellitus with vascular disease (HCC)   Rheumatoid arthritis (HCC)   PAF (paroxysmal atrial fibrillation) (HCC)   CAD (coronary artery disease)   Pneumonia due to COVID-19 virus   Hyponatremia   Multifocal pneumonia    Javier Beard is a 80 y.o. male with history of rheumatoid arthritis, prior amputation of a toe, admission with COVID-pneumonia now found to have methicillin sensitive Staph aureus bacteremia.  #1 Methicillin sensitive Staphylococcus aureus bacteremia:   Certainly could have developed this as a superinfection or secondary infection after his COVID-pneumonia  He also has had neck pain and back pain for the last several weeks and I wonder if he may be had a smoldering occult infection there.  I will order  MRI of his cervical and thoracic spine. If he cannot lie still for  this study will get CT w contrast of same areas  I would plan on giving him at least 4 weeks of systemic therapy.  TEE if cardiology will do one on him.  #2 rheumatoid arthritis:  Would NOT let him have Simponi when he leaves and make sure he has completed treatment for his infection FIRST  #3 pressure ulcer: Continue to monitor  #4 COVID: has been treated for this and is in airborne, contact   LOS: 16 days   Acey Lav 02/12/2020, 2:02 PM

## 2020-02-12 NOTE — Plan of Care (Signed)
  Problem: Respiratory: Goal: Will maintain a patent airway Outcome: Progressing   Problem: Clinical Measurements: Goal: Respiratory complications will improve Outcome: Progressing Note: Decreased patients oxygen levels from 15L [place] to 12L [place]. Current oxygen saturation is 100%   Problem: Nutrition: Goal: Adequate nutrition will be maintained Outcome: Not Progressing Note: Pt is not eating much of meals. Will continue to assist with feeds. Will discuss with rounding team adding nutritional supplements.

## 2020-02-12 NOTE — Progress Notes (Addendum)
NAME:  Javier Beard, MRN:  [id number], DOB:  03/24/1940, LOS: 16 ADMISSION DATE:  02/05/2020, CONSULTATION DATE:  02/12/20 REFERRING MD:  Dr. Loney Loh, CHIEF COMPLAINT:  Worsening dyspnea   Brief History:  80 y.o. 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  PCCM consulted for possible bipap  History of Present Illness:  Javier Beard is a 80 y.o. 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   Early morning hours of 1/12 Pt became acutely more dyspneic and had increasing oxygen requirements from 3L to 15L and non-rebreather.  He has not had increasing leukocytosis and no known HF.   ABG 7.4/26/188/19.   PCCM consulted for possible Bipap and ICU transfer. Pulmonary critical care called back 02/09/2020 for acute decompensation after 13 days of treatment of COVID.  Noted to have worsening renal failure, worsening mental status, and hypotension.  Pulmonary critical care at bedside at the request of rapid response team.  Transferred to the medical intensive care unit.  Family has been contacted for possible limited CODE BLUE status to avoid intubation if possible.  Past Medical History:   has a past medical history of Arthritis, Atrial fibrillation (HCC), Coronary artery disease, DM type 2 (diabetes mellitus, type 2) (HCC), Hypertension, and Rheumatoid arteritis (HCC).   Significant Hospital Events:  1/8 Admitted 1/12 PCCM consult, transfer to progressive  1/21 Worsened hypoxemia, septic shock due to pna on pressors - transferred to ICU 1/22 Cr improved, pressors weaned off 1/23 Cr better, remains  off pressors, O2 slowly weaning  Consults:  PCCM  Procedures:    Significant Diagnostic Tests:  1/12 CXR>>Increasing heterogeneous opacities throughout both lungs, concerning for worsening infection in the setting of COVID-19 1/14 Bubble study> LVEF 60-65%, no shunt. McConnels sign positive.  Echo 1/23 > LVEF 60-65%, RV function normal with mild enlargement. Moderate TR.  Micro Data:    Antimicrobials:   Remdesevir 1/7-1/11 02/09/2020 Zyvox 02/09/2020 cefepime 02/10/2020 >> cefazolin (MSSA bacteremia/pna)  Interim History / Subjective:  Weaned down to 12L [place] this morning and tolerating well. C/o back, chest pain. Related to positioning.   Objective   Blood pressure 127/75, pulse 91, temperature 98.9 F (37.2 C), temperature source Oral, resp. rate (!) 36, height 5\' 6"  (1.676 m), weight 79.6 kg, SpO2 94 %.    FiO2 (%):  [40 %-60 %] 40 %   Intake/Output Summary (Last 24 hours) at 02/12/2020 0752 Last data filed at 02/12/2020 0400 Gross per 24 hour  Intake 900 ml  Output 950 ml  Net -50 ml   Filed Weights   02/03/20 0410 02/09/20 0915 02/12/20 0412  Weight: 72.6 kg 77.5 kg 79.6 kg   General: Frail elderly male, in NAD HEENT: Oral mucosa is dry, no JVD is appreciated.  Neuro: Awake, alert, confused CV: Heart sounds are distant PULM: Rapid, shallow breathing, corase GI: soft, bsx4 active  Extremities: warm/dry, +1 edema LE, UEs swollen Skin: no rashes or lesions   Resolved Hospital Problem list   Septic shock  Assessment & Plan:   Acute hypoxemic respiratory failure - improving: Multifactorial. Worsened CXR. 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  Acute worsening - PE considered but on chronic Eliquis so not felt likely. Suspect aspiration given encephalopathy vs HCAP in setting of shock. Worsened in setting of metabolic acidosis increasing ventilatory needs, hyperventilation.  Acute decompensation likely due to MSSA bacteremia.  -DNR per  GOC and description of his values  -HFNC, goal SPO2>88, Now weaned down to 12L [place] HF.  -Abx as below -Weaning steroids. (on prednisone 5 at baseline)   MSSA pneumonia/bactermia - resolved: Leukocytosis, tachypnea, tachycardia with worsening hypoxemia and CXR infiltrates. MSSA in blood.TR on Echo and c/p back pain. - Cefazolin. Needs 4 weeks ABX pending MRI and TEE.  - Consider PICC for long term ABX - ID consult given MSSA bacteremia - Will need MRI cervical and thoracic spine once more stable.  - TEE when able.    Acute Renal Failure: Septic shock, hypovolemia - likely ATN. CR improved. UOP ok overnight. - Encourage PO intake - MAP > 65   RA: Plaquenil, MTX at baseline - Continue Plaquenil  - Holding MTX - Do not wean steroids past Prednisone 5 mg per home regimen   Toxic metabolic encephalopathy: in setting of sepsis, renal failure, hypoxemia, delirium. Improving - Supportive care   Afib: chronic  - eliquis  Best practice (evaluated daily)  Diet: npo - advance as tolerated Pain/Anxiety/Delirium protocol (if indicated): n/a VAP protocol (if indicated): n/a DVT prophylaxis: eliquis GI prophylaxis: n/a Glucose control:ssi Mobility: OOB to chair Disposition:icu  Goals of Care:  Last date of multidisciplinary goals of care discussion: 02/10/20 Family and staff present: MD, RN, daughter, cousin Summary of discussion: DNR. Continue aggressive care. Consider comfort if not improving or worsens Follow up goals of care discussion due: 1/24 Code Status: DNR  Labs   CBC: Recent Labs  Lab 02/06/20 0158 02/07/20 0353 02/08/20 0234 02/09/20 0201 02/10/20 0308 02/11/20 0209 02/12/20 0418  WBC 13.4* 12.5* 12.9* 30.4* 24.3* 15.6* 9.8  NEUTROABS 12.2* 11.1*  --   --   --   --   --   HGB 11.2* 12.2* 11.4* 12.6* 11.6* 9.5* 9.7*  HCT 31.9* 36.3* 32.9* 38.3* 34.9* 27.8* 30.3*  MCV 91.4 91.2 91.4 93.6 97.5 93.0 95.3  PLT 183 167 169 199 PLATELET CLUMPS NOTED ON SMEAR,  UNABLE TO ESTIMATE 83* 63*    Basic Metabolic Panel: Recent Labs  Lab 02/08/20 0234 02/09/20 0201 02/10/20 0308 02/11/20 0209 02/12/20 0418  NA 128* 132* 135 132* 130*  K 4.2 4.4 5.2* 3.8 3.8  CL 99 102 106 105 104  CO2 20* 17* 15* 19* 18*  GLUCOSE 212* 76 208* 102* 127*  BUN 27* 38* 31* 21 18  CREATININE 0.90 1.88* 1.32* 0.77 0.69  CALCIUM 7.6* 8.0* 7.5* 7.4* 7.5*  MG 2.3 2.0 2.1 2.1 2.1  PHOS  --   --  4.0  --   --    GFR: Estimated Creatinine Clearance: 76.7 mL/min (by C-G formula based on SCr of 0.69 mg/dL). Recent Labs  Lab 02/09/20 0201 02/09/20 0806 02/10/20 0308 02/11/20 0209 02/12/20 0418  PROCALCITON  --  2.89  --   --   --   WBC 30.4*  --  24.3* 15.6* 9.8    Liver Function Tests: No results for input(s): AST, ALT, ALKPHOS, BILITOT, PROT, ALBUMIN in the last 168 hours. No results for input(s): LIPASE, AMYLASE in the last 168 hours. Recent Labs  Lab 02/09/20 0959  AMMONIA 30    ABG    Component Value Date/Time   PHART 7.483 (H) 01/31/2020 0350  PCO2ART 26.2 (L) 01/31/2020 0350   PO2ART 188 (H) 01/31/2020 0350   HCO3 18.8 (L) 02/08/2020 2133   ACIDBASEDEF 5.2 (H) 02/08/2020 2133   O2SAT 86.2 02/08/2020 2133     Coagulation Profile: No results for input(s): INR, PROTIME in the last 168 hours.  Cardiac Enzymes: No results for input(s): CKTOTAL, CKMB, CKMBINDEX, TROPONINI in the last 168 hours.  HbA1C: No results found for: HGBA1C  CBG: Recent Labs  Lab 02/11/20 1102 02/11/20 1158 02/11/20 1602 02/11/20 2228 02/12/20 0719  GLUCAP 68* 111* 115* 146* 110*    Critical care time: N/A  Joneen Roach, AGACNP-BC [facility]  See Amion for personal pager PCCM on call pager [phone]  02/12/2020 9:26 AM

## 2020-02-12 NOTE — Progress Notes (Signed)
Physical Therapy Treatment Patient Details Name: Javier Beard MRN: [id number] DOB: [birth year] Today's Date: 02/12/2020    History of Present Illness Pt is a 80 y.o. male admitted 02/12/2020 with worsening SOB, nausea/vomitting. Workup for acute hypoxic respiratory failure due to COVID-19 PNA. CTA 1/14 negative for PE. Pt with worsening respiratory status and AMS early AM 1/21; head CT negative for acute abnormality. PMH includes RA, DM2, afib, HF.    PT Comments    Pt with significantly decreased activity tolerance with ability to stand EOB x 2 with RR up to 55 on 8L. Pt with significant weakness and fatigue and unable to transfer OOB or step today with return to supine. RN aware and D/C to CIR at this time not a tolerable option for pt. SNF currently recommended and will reassess as medical stability improves.     Follow Up Recommendations  SNF;Supervision/Assistance - 24 hour     Equipment Recommendations  Rolling walker with 5" wheels    Recommendations for Other Services       Precautions / Restrictions Precautions Precautions: Fall;Other (comment) Precaution Comments: watch SpO2    Mobility  Bed Mobility Overal bed mobility: Needs Assistance Bed Mobility: Supine to Sit;Sit to Supine     Supine to sit: Mod assist Sit to supine: Mod assist   General bed mobility comments: pt able to roll from left to right with mod assist to elevate trunk from surface and bring legs off EOB. Pt with right lean with significant struggle to achieve sitting today. Return to supine with assist to lift legs and position in bed. Total assist to slide toward [facility]  Transfers Overall transfer level: Needs assistance   Transfers: Sit to/from Stand Sit to Stand: Mod assist         General transfer comment: mod assist to stand from EOB x 2 with pt unable to weight shift or step limited by fatigue and returned to bed  Ambulation/Gait             General Gait Details: unable this  date   Stairs             Wheelchair Mobility    Modified Rankin (Stroke Patients Only)       Balance Overall balance assessment: Needs assistance   Sitting balance-Leahy Scale: Poor Sitting balance - Comments: right posterior lean with mod assist for sitting with progression to minguard   Standing balance support: Bilateral upper extremity supported Standing balance-Leahy Scale: Poor                              Cognition Arousal/Alertness: Awake/alert Behavior During Therapy: Flat affect Overall Cognitive Status: Impaired/Different from baseline Area of Impairment: Orientation                 Orientation Level: Time Current Attention Level: Selective   Following Commands: Follows one step commands consistently Safety/Judgement: Decreased awareness of deficits;Decreased awareness of safety   Problem Solving: Slow processing General Comments: pt internally distracted by pain and SOB      Exercises      General Comments        Pertinent Vitals/Pain Pain Score: 5  Pain Location: back Pain Descriptors / Indicators: Aching;Guarding;Sore Pain Intervention(s): Limited activity within patient's tolerance;Monitored during session;Repositioned;Patient requesting pain meds-RN notified    Home Living  Prior Function            PT Goals (current goals can now be found in the care plan section) Acute Rehab PT Goals Time For Goal Achievement: 02/26/20 Potential to Achieve Goals: Fair Progress towards PT goals: Not progressing toward goals - comment (medical and physical decline)    Frequency    Min 3X/week      PT Plan Discharge plan needs to be updated    Co-evaluation              AM-PAC PT "6 Clicks" Mobility   Outcome Measure  Help needed turning from your back to your side while in a flat bed without using bedrails?: A Little Help needed moving from lying on your back to sitting on the  side of a flat bed without using bedrails?: A Lot Help needed moving to and from a bed to a chair (including a wheelchair)?: A Lot Help needed standing up from a chair using your arms (e.g., wheelchair or bedside chair)?: A Lot Help needed to walk in hospital room?: Total Help needed climbing 3-5 steps with a railing? : Total 6 Click Score: 11    End of Session Equipment Utilized During Treatment: Oxygen Activity Tolerance: Patient limited by fatigue Patient left: in bed;with call bell/phone within reach;with bed alarm set Nurse Communication: Mobility status PT Visit Diagnosis: Unsteadiness on feet (R26.81);Other abnormalities of gait and mobility (R26.89)     Time: 1340-1402 PT Time Calculation (min) (ACUTE ONLY): 22 min  Charges:  $Therapeutic Activity: 8-22 mins                     Javier Beard P, PT Acute Rehabilitation Services Pager: [phone] Office: [phone]    Javier Beard Javier Beard 02/12/2020, 2:10 PM

## 2020-02-13 ENCOUNTER — Other Ambulatory Visit: Payer: Self-pay

## 2020-02-13 ENCOUNTER — Inpatient Hospital Stay (HOSPITAL_COMMUNITY): Payer: Medicare Other

## 2020-02-13 DIAGNOSIS — R0902 Hypoxemia: Secondary | ICD-10-CM

## 2020-02-13 DIAGNOSIS — M069 Rheumatoid arthritis, unspecified: Secondary | ICD-10-CM | POA: Diagnosis not present

## 2020-02-13 DIAGNOSIS — R7881 Bacteremia: Secondary | ICD-10-CM | POA: Diagnosis not present

## 2020-02-13 DIAGNOSIS — E871 Hypo-osmolality and hyponatremia: Secondary | ICD-10-CM | POA: Diagnosis not present

## 2020-02-13 DIAGNOSIS — D696 Thrombocytopenia, unspecified: Secondary | ICD-10-CM

## 2020-02-13 DIAGNOSIS — D649 Anemia, unspecified: Secondary | ICD-10-CM

## 2020-02-13 DIAGNOSIS — J9601 Acute respiratory failure with hypoxia: Secondary | ICD-10-CM | POA: Diagnosis not present

## 2020-02-13 DIAGNOSIS — U071 COVID-19: Secondary | ICD-10-CM | POA: Diagnosis not present

## 2020-02-13 LAB — CBC
HCT: 33.1 % — ABNORMAL LOW (ref 39.0–52.0)
Hemoglobin: 11 g/dL — ABNORMAL LOW (ref 13.0–17.0)
MCH: 30.8 pg (ref 26.0–34.0)
MCHC: 33.2 g/dL (ref 30.0–36.0)
MCV: 92.7 fL (ref 80.0–100.0)
Platelets: 64 10*3/uL — ABNORMAL LOW (ref 150–400)
RBC: 3.57 MIL/uL — ABNORMAL LOW (ref 4.22–5.81)
RDW: 15.6 % — ABNORMAL HIGH (ref 11.5–15.5)
WBC: 8.5 10*3/uL (ref 4.0–10.5)
nRBC: 0 % (ref 0.0–0.2)

## 2020-02-13 LAB — BASIC METABOLIC PANEL
Anion gap: 9 (ref 5–15)
BUN: 18 mg/dL (ref 8–23)
CO2: 18 mmol/L — ABNORMAL LOW (ref 22–32)
Calcium: 7.5 mg/dL — ABNORMAL LOW (ref 8.9–10.3)
Chloride: 104 mmol/L (ref 98–111)
Creatinine, Ser: 0.7 mg/dL (ref 0.61–1.24)
GFR, Estimated: >60 mL/min (ref >60)
Glucose, Bld: 58 mg/dL — ABNORMAL LOW (ref 70–99)
Potassium: 4 mmol/L (ref 3.5–5.1)
Sodium: 131 mmol/L — ABNORMAL LOW (ref 135–145)

## 2020-02-13 LAB — GLUCOSE, CAPILLARY
Glucose-Capillary: 71 mg/dL (ref 70–99)
Glucose-Capillary: 130 mg/dL — ABNORMAL HIGH (ref 70–99)
Glucose-Capillary: 149 mg/dL — ABNORMAL HIGH (ref 70–99)
Glucose-Capillary: 103 mg/dL — ABNORMAL HIGH (ref 70–99)

## 2020-02-13 LAB — CULTURE, BLOOD (ROUTINE X 2)

## 2020-02-13 LAB — MAGNESIUM: Magnesium: 2 mg/dL (ref 1.7–2.4)

## 2020-02-13 LAB — C-REACTIVE PROTEIN: CRP: 10.4 mg/dL — ABNORMAL HIGH (ref <1.0)

## 2020-02-13 IMAGING — DX DG CHEST 1V PORT
1 series · 1 of 1 positions shown · non-contrast
Comparison: Chest CTA [DATE]. Portable chest [DATE] and
earlier.

CLINICAL DATA: 79-year-old male with hypoxia.  [CP].

EXAM:
PORTABLE CHEST 1 VIEW

[chest ap]
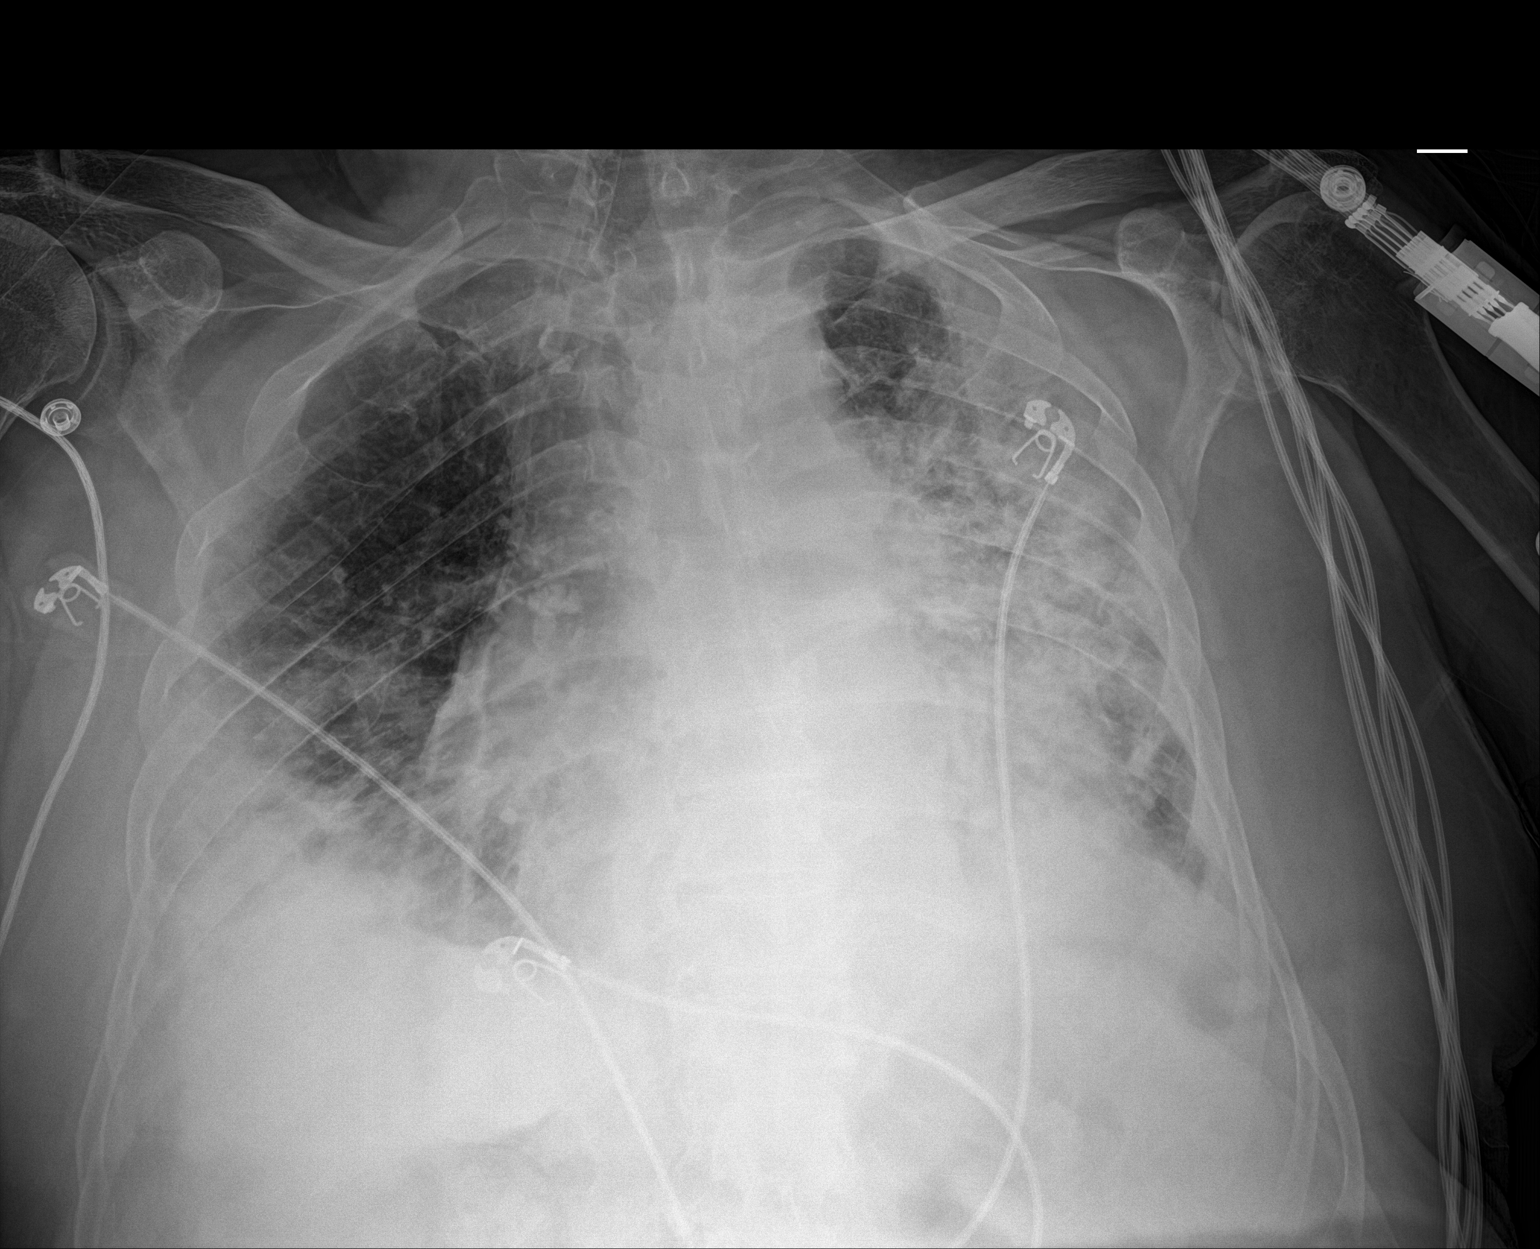

[1 of 1 positions shown; findings below may reference images not displayed]

FINDINGS: Portable AP semi upright view at [CP] hours. The patient is less
rotated today. Lung volumes are improved. Mediastinal contour
appears stable with moderate to large hiatal hernia demonstrated by
CTA. Visualized tracheal air column is within normal limits.

Left greater than right confluent bilateral pulmonary opacity. Right
upper lobe remains relatively spared. Ventilation is stable. No
pneumothorax or definite pleural effusion. Stable visualized osseous
structures.
IMPRESSION: Stable [CP] pneumonia since [DATE].

## 2020-02-13 MED ORDER — INSULIN ASPART 100 UNIT/ML ~~LOC~~ SOLN
0.0000 [IU] | Freq: Three times a day (TID) | SUBCUTANEOUS | Status: DC
  Administered 2020-02-13 – 2020-02-14 (×2): 1 [IU] via SUBCUTANEOUS
  Administered 2020-02-14 – 2020-02-15 (×2): 2 [IU] via SUBCUTANEOUS
  Administered 2020-02-15 (×2): 1 [IU] via SUBCUTANEOUS
  Administered 2020-02-17 – 2020-02-18 (×2): 3 [IU] via SUBCUTANEOUS
  Administered 2020-02-18: 2 [IU] via SUBCUTANEOUS
  Administered 2020-02-18: 5 [IU] via SUBCUTANEOUS
  Administered 2020-02-19 – 2020-02-20 (×4): 2 [IU] via SUBCUTANEOUS
  Administered 2020-02-21: 1 [IU] via SUBCUTANEOUS

## 2020-02-13 MED ORDER — FUROSEMIDE 10 MG/ML IJ SOLN
40.0000 mg | Freq: Once | INTRAMUSCULAR | Status: AC
  Administered 2020-02-13: 40 mg via INTRAVENOUS
  Filled 2020-02-13: qty 4

## 2020-02-13 MED ORDER — VANCOMYCIN HCL 1750 MG/350ML IV SOLN
1750.0000 mg | Freq: Once | INTRAVENOUS | Status: AC
  Administered 2020-02-13: 1750 mg via INTRAVENOUS
  Filled 2020-02-13: qty 350

## 2020-02-13 MED ORDER — VANCOMYCIN HCL IN DEXTROSE 1-5 GM/200ML-% IV SOLN
1000.0000 mg | Freq: Two times a day (BID) | INTRAVENOUS | Status: DC
  Administered 2020-02-14 – 2020-02-20 (×13): 1000 mg via INTRAVENOUS
  Filled 2020-02-13 (×13): qty 200

## 2020-02-13 MED ORDER — DOCUSATE SODIUM 100 MG PO CAPS
100.0000 mg | ORAL_CAPSULE | Freq: Two times a day (BID) | ORAL | Status: DC
  Administered 2020-02-13 – 2020-02-20 (×13): 100 mg via ORAL
  Filled 2020-02-13 (×14): qty 1

## 2020-02-13 NOTE — Progress Notes (Addendum)
NAME:  Javier Beard, MRN:  [id number], DOB:  04/18/1940, LOS: 17 ADMISSION DATE:  02/18/2020, CONSULTATION DATE:  02/13/20 REFERRING MD:  Dr. Loney Loh, CHIEF COMPLAINT:  Worsening dyspnea   Brief History:  80 y.o. 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  PCCM consulted for possible bipap  History of Present Illness:  Javier Beard is a 80 y.o. 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   Early morning hours of 1/12 Pt became acutely more dyspneic and had increasing oxygen requirements from 3L to 15L and non-rebreather.  He has not had increasing leukocytosis and no known HF.   ABG 7.4/26/188/19.   PCCM consulted for possible Bipap and ICU transfer. Pulmonary critical care called back 02/09/2020 for acute decompensation after 13 days of treatment of COVID.  Noted to have worsening renal failure, worsening mental status, and hypotension.  Pulmonary critical care at bedside at the request of rapid response team.  Transferred to the medical intensive care unit.  Family has been contacted for possible limited CODE BLUE status to avoid intubation if possible.  Past Medical History:   has a past medical history of Arthritis, Atrial fibrillation (HCC), Coronary artery disease, DM type 2 (diabetes mellitus, type 2) (HCC), Hypertension, and Rheumatoid arteritis (HCC).   Significant Hospital Events:  1/8 Admitted 1/12 PCCM consult, transfer to progressive  1/21 Worsened hypoxemia, septic shock due to pna on pressors - transferred to ICU 1/22 Cr improved, pressors weaned off 1/23 Cr better, remains  off pressors, O2 slowly weaning  Consults:  PCCM  Procedures:    Significant Diagnostic Tests:  1/12 CXR>>Increasing heterogeneous opacities throughout both lungs, concerning for worsening infection in the setting of COVID-19 1/14 Bubble study> LVEF 60-65%, no shunt. McConnels sign positive.  Echo 1/23 > LVEF 60-65%, RV function normal with mild enlargement. Moderate TR.  Micro Data:    Antimicrobials:   Remdesevir 1/7-1/11 02/09/2020 Zyvox 02/09/2020 cefepime 02/10/2020 >> cefazolin (MSSA bacteremia/pna)  Interim History / Subjective:  Unfortunately he declined a bit yesterday and is now requiring heated high flow again.    Objective   Blood pressure 107/62, pulse 78, temperature 97.8 F (36.6 C), temperature source Axillary, resp. rate (!) 27, height 5\' 6"  (1.676 m), weight 79.6 kg, SpO2 100 %.    FiO2 (%):  [70 %-80 %] 70 %   Intake/Output Summary (Last 24 hours) at 02/13/2020 1054 Last data filed at 02/13/2020 02/15/2020 Gross per 24 hour  Intake 940.28 ml  Output 350 ml  Net 590.28 ml   Filed Weights   02/03/20 0410 02/09/20 0915 02/12/20 0412  Weight: 72.6 kg 77.5 kg 79.6 kg   General: Frail elderly male, in NAD. Fatigued HEENT: Oral mucosa is dry, no JVD is appreciated.  Neuro: Drowsy, but does arouse to verbal and answers questions appropriately.  CV: Regular rate and rhythm  PULM:shallow breathing. Diminished.  GI: soft, NT Extremities: warm/dry, 1+ Edema bilateral LE Skin: no rashes or lesions   Resolved Hospital Problem list   Septic shock  Assessment & Plan:   Acute hypoxemic respiratory failure - improving: Multifactorial. Worsened CXR. 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 Acute worsening - PE considered but on chronic Eliquis so not felt likely. Suspect aspiration given encephalopathy vs HCAP in setting of shock. Worsened in setting of metabolic acidosis increasing ventilatory needs, hyperventilation.  Acute decompensation  likely due to MSSA bacteremia.   Not sure what happened overnight to cause him to require more FiO2. Was on 8L [place], now on 70% HF at 30 lpm. Most likely de-recruited. On eliquis so doubt PE. On tx for MSSA PNA, no reports of aspiration. CXR looks essentially unchanged.   -DNR per GOC and description of his values  -HFNC, goal SPO2>88, weaned down to 50% 25 LPM -Abx as below - Add lasix today -Weaning steroids. (on prednisone 5 at baseline)   MSSA pneumonia/bactermia - resolved: Leukocytosis, tachypnea, tachycardia with worsening hypoxemia and CXR infiltrates. MSSA in blood.TR on Echo and c/p back pain. - Cefazolin. Antibiotics per ID - Consider PICC - Will need MRI cervical and thoracic spine once more stable.  - TEE when able.    Acute Renal Failure: Septic shock, hypovolemia - likely ATN. CR improved. UOP ok overnight. - Per primary  RA: Plaquenil, MTX at baseline - Continue Plaquenil  - Holding MTX - Do not wean steroids past Prednisone 5 mg per home regimen   Toxic metabolic encephalopathy: in setting of sepsis, renal failure, hypoxemia, delirium. Improving - Supportive care   Afib: chronic  - eliquis, monitor plateletes  Best practice (evaluated daily)  Diet: npo - advance as tolerated Pain/Anxiety/Delirium protocol (if indicated): n/a VAP protocol (if indicated): n/a DVT prophylaxis: eliquis GI prophylaxis: n/a Glucose control:ssi Mobility: OOB to chair Disposition:icu  Goals of Care:  Last date of multidisciplinary goals of care discussion: 02/10/20 Family and staff present: MD, RN, daughter, cousin Summary of discussion: DNR. Continue aggressive care. Consider comfort if not improving or worsens Follow up goals of care discussion due: per primary Code Status: DNR  Labs   CBC: Recent Labs  Lab 02/07/20 0353 02/08/20 0234 02/09/20 0201 02/10/20 0308 02/11/20 0209 02/12/20 0418  WBC 12.5* 12.9* 30.4* 24.3* 15.6* 9.8  NEUTROABS 11.1*  --   --   --    --   --   HGB 12.2* 11.4* 12.6* 11.6* 9.5* 9.7*  HCT 36.3* 32.9* 38.3* 34.9* 27.8* 30.3*  MCV 91.2 91.4 93.6 97.5 93.0 95.3  PLT 167 169 199 PLATELET CLUMPS NOTED ON SMEAR, UNABLE TO ESTIMATE 83* 63*    Basic Metabolic Panel: Recent Labs  Lab 02/09/20 0201 02/10/20 0308 02/11/20 0209 02/12/20 0418 02/13/20 0843  NA 132* 135 132* 130* 131*  K 4.4 5.2* 3.8 3.8 4.0  CL 102 106 105 104 104  CO2 17* 15* 19* 18* 18*  GLUCOSE 76 208* 102* 127* 58*  BUN 38* 31* 21 18 18   CREATININE 1.88* 1.32* 0.77 0.69 0.70  CALCIUM 8.0* 7.5* 7.4* 7.5* 7.5*  MG 2.0 2.1 2.1 2.1 2.0  PHOS  --  4.0  --   --   --    GFR: Estimated Creatinine Clearance: 76.7 mL/min (by C-G formula based on SCr of 0.7 mg/dL). Recent Labs  Lab 02/09/20 0201 02/09/20 0806 02/10/20 0308 02/11/20 0209 02/12/20 0418  PROCALCITON  --  2.89  --   --   --   WBC 30.4*  --  24.3* 15.6* 9.8    Liver Function Tests: No results for input(s): AST, ALT, ALKPHOS, BILITOT, PROT, ALBUMIN in the last 168 hours. No results for input(s): LIPASE, AMYLASE in the last 168 hours.  Recent Labs  Lab 02/09/20 0959  AMMONIA 30    ABG    Component Value Date/Time   PHART 7.483 (H) 01/31/2020 0350   PCO2ART 26.2 (L) 01/31/2020 0350   PO2ART 188 (H) 01/31/2020 0350   HCO3 18.8 (L) 02/08/2020 2133   ACIDBASEDEF 5.2 (H) 02/08/2020 2133   O2SAT 86.2 02/08/2020 2133     Coagulation Profile: No results for input(s): INR, PROTIME in the last 168 hours.  Cardiac Enzymes: No results for input(s): CKTOTAL, CKMB, CKMBINDEX, TROPONINI in the last 168 hours.  HbA1C: No results found for: HGBA1C  CBG: Recent Labs  Lab 02/12/20 0719 02/12/20 1314 02/12/20 1725 02/12/20 2131 02/13/20 0746  GLUCAP 110* 87 158* 153* 71    Critical care time: N/A  Joneen Roach, AGACNP-BC [facility]  See Amion for personal pager PCCM on call pager [phone]  02/13/2020 10:54 AM

## 2020-02-13 NOTE — Progress Notes (Signed)
Subjective:  No new complaints  Antibiotics:  Anti-infectives (From admission, onward)   Start     Dose/Rate Route Frequency Ordered Stop   02/09/20 2300  ceFAZolin (ANCEF) IVPB 2g/100 mL premix        2 g 200 mL/hr over 30 Minutes Intravenous Every 8 hours 02/09/20 2233     02/09/20 1000  ceFEPIme (MAXIPIME) 1 g in sodium chloride 0.9 % 100 mL IVPB  Status:  Discontinued        1 g 200 mL/hr over 30 Minutes Intravenous Every 12 hours 02/09/20 0846 02/09/20 2233   02/09/20 1000  linezolid (ZYVOX) IVPB 600 mg  Status:  Discontinued        600 mg 300 mL/hr over 60 Minutes Intravenous Every 12 hours 02/09/20 0846 02/09/20 2233   01/27/20 1000  remdesivir 100 mg in sodium chloride 0.9 % 100 mL IVPB        100 mg 200 mL/hr over 30 Minutes Intravenous Daily 02/13/2020 1602 01/30/20 1418   01/27/20 1000  hydroxychloroquine (PLAQUENIL) tablet 200 mg  Status:  Discontinued        200 mg Oral 2 times daily 01/27/20 0358 02/13/20 1155   02/13/2020 1630  remdesivir 100 mg in sodium chloride 0.9 % 100 mL IVPB        100 mg 200 mL/hr over 30 Minutes Intravenous Every 30 min 2020-02-13 1602 02/13/2020 1745      Medications: Scheduled Meds: . apixaban  5 mg Oral BID  . vitamin C  500 mg Oral Daily  . calcium carbonate  1,250 mg Oral Q breakfast  . calcium carbonate  0.5 tablet Oral Q supper  . chlorhexidine  15 mL Mouth Rinse BID  . Chlorhexidine Gluconate Cloth  6 each Topical Daily  . docusate sodium  100 mg Oral BID  . feeding supplement  237 mL Oral TID BM  . folic acid  1 mg Oral Daily  . furosemide  40 mg Intravenous Once  . insulin aspart  0-20 Units Subcutaneous TID WC  . mouth rinse  15 mL Mouth Rinse BID  . pantoprazole  40 mg Oral Daily  . polyethylene glycol  17 g Oral Daily  . pravastatin  80 mg Oral q1800  . predniSONE  20 mg Oral Q breakfast   Followed by  . [START ON 02/18/2020] predniSONE  10 mg Oral Q breakfast   Followed by  . [START ON 02/23/2020] predniSONE  5  mg Oral Q breakfast  . vitamin B-12  1,000 mcg Oral Daily  . zinc sulfate  220 mg Oral Daily   Continuous Infusions: . sodium chloride 10 mL/hr at 02/13/20 1054  .  ceFAZolin (ANCEF) IV Stopped (02/13/20 1036)   PRN Meds:.acetaminophen **OR** acetaminophen, senna-docusate    Objective: Weight change:   Intake/Output Summary (Last 24 hours) at 02/13/2020 1208 Last data filed at 02/13/2020 1054 Gross per 24 hour  Intake 1047.93 ml  Output 350 ml  Net 697.93 ml   Blood pressure 107/62, pulse 94, temperature 97.8 F (36.6 C), temperature source Axillary, resp. rate (!) 32, height  (1.676 m), weight 79.6 kg, SpO2 98 %. Temp:  [96.9 F (36.1 C)-97.9 F (36.6 C)] 97.8 F (36.6 C) (01/25 0700) Pulse Rate:  [73-110] 94 (01/25 1000) Resp:  [20-34] 32 (01/25 1000) BP: (96-151)/(61-102) 107/62 (01/25 0900) SpO2:  [87 %-100 %] 98 % (01/25 1000) FiO2 (%):  [70 %-80 %] 70 % (01/25 0832)  Physical Exam: Physical Exam HENT:     Head: Normocephalic and atraumatic.  Eyes:     Extraocular Movements: Extraocular movements intact.  Cardiovascular:     Rate and Rhythm: Rhythm irregular.     Heart sounds: Murmur heard.    Pulmonary:     Effort: No respiratory distress.     Breath sounds: Rhonchi present.  Abdominal:     General: Bowel sounds are normal. There is no distension.  Musculoskeletal:        General: Normal range of motion.       Feet:  Feet:     Right foot:     Skin integrity: Callus present. No ulcer or blister.     Left foot:     Skin integrity: No ulcer, blister, skin breakdown or erythema.  Skin:    General: Skin is warm.  Neurological:     General: No focal deficit present.     Mental Status: He is alert.       CBC:    BMET Recent Labs    02/12/20 0418 02/13/20 0843  NA 130* 131*  K 3.8 4.0  CL 104 104  CO2 18* 18*  GLUCOSE 127* 58*  BUN 18 18  CREATININE 0.69 0.70  CALCIUM 7.5* 7.5*     Liver Panel  No results for input(s): PROT,  ALBUMIN, AST, ALT, ALKPHOS, BILITOT, BILIDIR, IBILI in the last 72 hours.     Sedimentation Rate No results for input(s): ESRSEDRATE in the last 72 hours. C-Reactive Protein Recent Labs    02/12/20 0418 02/13/20 0843  CRP 11.3* 10.4*    Micro Results: Recent Results (from the past 720 hour(s))  Resp Panel by RT-PCR (Flu A&B, Covid) Nasopharyngeal Swab     Status: Abnormal   Collection Time: 23-Feb-2020  1:42 PM   Specimen: Nasopharyngeal Swab; Nasopharyngeal(NP) swabs in vial transport medium  Result Value Ref Range Status   SARS Coronavirus 2 by RT PCR POSITIVE (A) NEGATIVE Final    Comment: RESULT CALLED TO, READ BACK BY AND VERIFIED WITH: [email or web address].M,RN @ 1547 ON 2020-02-23, [email or web address] (NOTE) SARS-CoV-2 target nucleic acids are DETECTED.  The SARS-CoV-2 RNA is generally detectable in upper respiratory specimens during the acute phase of infection. Positive results are indicative of the presence of the identified virus, but do not rule out bacterial infection or co-infection with other pathogens not detected by the test. Clinical correlation with patient history and other diagnostic information is necessary to determine patient infection status. The expected result is Negative.  Fact Sheet for Patients: BloggerCourse.com  Fact Sheet for Healthcare Providers: SeriousBroker.it  This test is not yet approved or cleared by the [place] FDA and  has been authorized for detection and/or diagnosis of SARS-CoV-2 by FDA under an Emergency Use Authorization (EUA).  This EUA will remain in effect (meaning this  test can be used) for the duration of  the COVID-19 declaration under Section 564(b)(1) of the Act, 21 U.S.C. section 360bbb-3(b)(1), unless the authorization is terminated or revoked sooner.     Influenza A by PCR NEGATIVE NEGATIVE Final   Influenza B by PCR NEGATIVE NEGATIVE Final    Comment: (NOTE) The Xpert Xpress  SARS-CoV-2/FLU/RSV plus assay is intended as an aid in the diagnosis of influenza from Nasopharyngeal swab specimens and should not be used as a sole basis for treatment. Nasal washings and aspirates are unacceptable for Xpert Xpress SARS-CoV-2/FLU/RSV testing.  Fact Sheet for Patients: BloggerCourse.com  Fact Sheet for Healthcare Providers: SeriousBroker.it  This test is not yet approved or cleared by the [place] and has been authorized for detection and/or diagnosis of SARS-CoV-2 by FDA under an Emergency Use Authorization (EUA). This EUA will remain in effect (meaning this test can be used) for the duration of the COVID-19 declaration under Section 564(b)(1) of the Act, 21 U.S.C. section 360bbb-3(b)(1), unless the authorization is terminated or revoked.  Performed at [facility], [address] [place], [place] [address]   Blood Culture (routine x 2)     Status: None   Collection Time: 02-12-20  4:11 PM   Specimen: BLOOD LEFT FOREARM  Result Value Ref Range Status   Specimen Description   Final    BLOOD LEFT FOREARM Performed at [facility], [address] [place], [place] [address]    Special Requests   Final    BOTTLES DRAWN AEROBIC AND ANAEROBIC Blood Culture adequate volume Performed at [facility], [address] [place], [place] [address]    Culture   Final    NO GROWTH 5 DAYS Performed at [facility], [address] [place], [place] [address]    Report Status 01/31/2020 FINAL  Final  Blood Culture (routine x 2)     Status: None   Collection Time: Feb 12, 2020  4:45 PM   Specimen: BLOOD RIGHT HAND  Result Value Ref Range Status   Specimen Description   Final    BLOOD RIGHT HAND Performed at [facility], [address], [place], [place] [address]    Special Requests   Final    BOTTLES DRAWN AEROBIC AND ANAEROBIC Blood Culture adequate volume Performed at  [facility], [address] [place], [place] [address]    Culture   Final    NO GROWTH 5 DAYS Performed at [facility], [address] [place], [place] [address]    Report Status 01/31/2020 FINAL  Final  Culture, blood (Routine X 2) w Reflex to ID Panel     Status: Abnormal   Collection Time: 02/09/20  8:10 AM   Specimen: BLOOD LEFT HAND  Result Value Ref Range Status   Specimen Description BLOOD LEFT HAND  Final   Special Requests   Final    BOTTLES DRAWN AEROBIC ONLY Blood Culture results may not be optimal due to an inadequate volume of blood received in culture bottles   Culture  Setup Time   Final    GRAM POSITIVE COCCI IN CLUSTERS AEROBIC BOTTLE ONLY CRITICAL VALUE NOTED.  VALUE IS CONSISTENT WITH PREVIOUSLY REPORTED AND CALLED VALUE.    Culture (A)  Final    STAPHYLOCOCCUS AUREUS SUSCEPTIBILITIES PERFORMED ON PREVIOUS CULTURE WITHIN THE LAST 5 DAYS. Performed at [facility], [address], [place], [place] [address]    Report Status 02/11/2020 FINAL  Final  Culture, blood (Routine X 2) w Reflex to ID Panel     Status: Abnormal   Collection Time: 02/09/20  8:22 AM   Specimen: BLOOD RIGHT HAND  Result Value Ref Range Status   Specimen Description BLOOD RIGHT HAND  Final   Special Requests   Final    BOTTLES DRAWN AEROBIC AND ANAEROBIC Blood Culture results may not be optimal due to an inadequate volume of blood received in culture bottles   Culture  Setup Time   Final    GRAM POSITIVE COCCI IN CLUSTERS IN BOTH AEROBIC AND ANAEROBIC BOTTLES Organism ID to  follow CRITICAL RESULT CALLED TO, READ BACK BY AND VERIFIED WITH: C AMEND [facility] 02/09/20 2216 JDW Performed at [facility], [address], [place], [place] [address]    Culture STAPHYLOCOCCUS AUREUS (A)  Final   Report Status 02/11/2020 FINAL  Final   Organism ID, Bacteria STAPHYLOCOCCUS AUREUS  Final      Susceptibility   Staphylococcus aureus - MIC*    CIPROFLOXACIN <=0.5  SENSITIVE Sensitive     ERYTHROMYCIN <=0.25 SENSITIVE Sensitive     GENTAMICIN <=0.5 SENSITIVE Sensitive     OXACILLIN 0.5 SENSITIVE Sensitive     TETRACYCLINE <=1 SENSITIVE Sensitive     VANCOMYCIN 1 SENSITIVE Sensitive     TRIMETH/SULFA <=10 SENSITIVE Sensitive     CLINDAMYCIN <=0.25 SENSITIVE Sensitive     RIFAMPIN <=0.5 SENSITIVE Sensitive     Inducible Clindamycin NEGATIVE Sensitive     * STAPHYLOCOCCUS AUREUS  Blood Culture ID Panel (Reflexed)     Status: Abnormal   Collection Time: 02/09/20  8:22 AM  Result Value Ref Range Status   Enterococcus faecalis NOT DETECTED NOT DETECTED Final   Enterococcus Faecium NOT DETECTED NOT DETECTED Final   Listeria monocytogenes NOT DETECTED NOT DETECTED Final   Staphylococcus species DETECTED (A) NOT DETECTED Final    Comment: CRITICAL RESULT CALLED TO, READ BACK BY AND VERIFIED WITH: C AMEND PHARMD 02/09/20 2216 JDW    Staphylococcus aureus (BCID) DETECTED (A) NOT DETECTED Final    Comment: CRITICAL RESULT CALLED TO, READ BACK BY AND VERIFIED WITH: C AMEND PHARMD 02/09/20 2216 JDW    Staphylococcus epidermidis NOT DETECTED NOT DETECTED Final   Staphylococcus lugdunensis NOT DETECTED NOT DETECTED Final   Streptococcus species NOT DETECTED NOT DETECTED Final   Streptococcus agalactiae NOT DETECTED NOT DETECTED Final   Streptococcus pneumoniae NOT DETECTED NOT DETECTED Final   Streptococcus pyogenes NOT DETECTED NOT DETECTED Final   A.calcoaceticus-baumannii NOT DETECTED NOT DETECTED Final   Bacteroides fragilis NOT DETECTED NOT DETECTED Final   Enterobacterales NOT DETECTED NOT DETECTED Final   Enterobacter cloacae complex NOT DETECTED NOT DETECTED Final   Escherichia coli NOT DETECTED NOT DETECTED Final   Klebsiella aerogenes NOT DETECTED NOT DETECTED Final   Klebsiella oxytoca NOT DETECTED NOT DETECTED Final   Klebsiella pneumoniae NOT DETECTED NOT DETECTED Final   Proteus species NOT DETECTED NOT DETECTED Final   Salmonella species  NOT DETECTED NOT DETECTED Final   Serratia marcescens NOT DETECTED NOT DETECTED Final   Haemophilus influenzae NOT DETECTED NOT DETECTED Final   Neisseria meningitidis NOT DETECTED NOT DETECTED Final   Pseudomonas aeruginosa NOT DETECTED NOT DETECTED Final   Stenotrophomonas maltophilia NOT DETECTED NOT DETECTED Final   Candida albicans NOT DETECTED NOT DETECTED Final   Candida auris NOT DETECTED NOT DETECTED Final   Candida glabrata NOT DETECTED NOT DETECTED Final   Candida krusei NOT DETECTED NOT DETECTED Final   Candida parapsilosis NOT DETECTED NOT DETECTED Final   Candida tropicalis NOT DETECTED NOT DETECTED Final   Cryptococcus neoformans/gattii NOT DETECTED NOT DETECTED Final   Meth resistant mecA/C and MREJ NOT DETECTED NOT DETECTED Final    Comment: Performed at [facility], [address], [place], [place] [address]  MRSA PCR Screening     Status: None   Collection Time: 02/09/20  9:45 AM   Specimen: Nasal Mucosa; Nasopharyngeal  Result Value Ref Range Status   MRSA by PCR NEGATIVE NEGATIVE Final    Comment:        The  GeneXpert MRSA Assay (FDA approved for NASAL specimens only), is one component of a comprehensive MRSA colonization surveillance program. It is not intended to diagnose MRSA infection nor to guide or monitor treatment for MRSA infections. Performed at [facility], [address] [place], [place] [address]   Culture, blood (Routine X 2) w Reflex to ID Panel     Status: Abnormal   Collection Time: 02/10/20  3:30 AM   Specimen: BLOOD LEFT HAND  Result Value Ref Range Status   Specimen Description BLOOD LEFT HAND  Final   Special Requests   Final    AEROBIC BOTTLE ONLY Blood Culture results may not be optimal due to an inadequate volume of blood received in culture bottles   Culture  Setup Time   Final    GRAM POSITIVE COCCI IN CLUSTERS AEROBIC BOTTLE ONLY CRITICAL VALUE NOTED.  VALUE IS CONSISTENT WITH PREVIOUSLY REPORTED AND CALLED  VALUE.    Culture (A)  Final    STAPHYLOCOCCUS CAPITIS THE SIGNIFICANCE OF ISOLATING THIS ORGANISM FROM A SINGLE SET OF BLOOD CULTURES WHEN MULTIPLE SETS ARE DRAWN IS UNCERTAIN. PLEASE NOTIFY THE MICROBIOLOGY DEPARTMENT WITHIN ONE WEEK IF SPECIATION AND SENSITIVITIES ARE REQUIRED. Performed at [facility], [address] [place], [place] [address]    Report Status 02/13/2020 FINAL  Final  Culture, blood (Routine X 2) w Reflex to ID Panel     Status: None (Preliminary result)   Collection Time: 02/10/20  6:37 PM   Specimen: BLOOD  Result Value Ref Range Status   Specimen Description BLOOD SITE NOT SPECIFIED  Final   Special Requests   Final    BOTTLES DRAWN AEROBIC AND ANAEROBIC Blood Culture adequate volume   Culture   Final    NO GROWTH 2 DAYS Performed at [facility], [address], [place], [place] [address]    Report Status PENDING  Incomplete    Studies/Results: MR CERVICAL SPINE W WO CONTRAST  Result Date: 02/12/2020 CLINICAL DATA:  Epidural abscess. EXAM: MRI CERVICAL AND THORACIC SPINE WITHOUT AND WITH CONTRAST TECHNIQUE: Multiplanar and multiecho pulse sequences of the cervical spine, to include the craniocervical junction and cervicothoracic junction, and the thoracic spine, were obtained without and with intravenous contrast. CONTRAST:  8mL GADAVIST GADOBUTROL 1 MMOL/ML IV SOLN COMPARISON:  None. FINDINGS: MRI CERVICAL SPINE FINDINGS Images are significantly degraded by motion, particularly axial views. Alignment: Straightening of the cervical curvature. Small anterolisthesis of C2 over C3 and small retrolisthesis of C3 over C4. Vertebrae: No fracture, evidence of discitis, or bone lesion. Cord: No gross cord signal abnormality. Posterior Fossa, vertebral arteries, paraspinal tissues: Negative. Disc levels: C2-3: Facet degenerative changes resulting mild left neural foraminal narrowing. No spinal canal stenosis. C3-4: Posterior disc protrusion resulting in mild  spinal canal stenosis. Uncovertebral and facet degenerative change resulting in moderate bilateral neural foraminal narrowing. C4-5: Posterior disc protrusion resulting in mild spinal canal stenosis. Uncovertebral and facet degenerative changes resulting in severe right and moderate left neural foraminal narrowing. C5-6: Posterior disc protrusion resulting mild spinal canal stenosis. Uncovertebral and facet degenerative changes resulting in moderate bilateral neural foraminal narrowing. C6-7: Posterior disc protrusion resulting mild spinal canal stenosis. Uncovertebral and facet degenerative changes resulting in mild bilateral neural foraminal narrowing. C7-T1: No spinal canal or neural foraminal stenosis. MRI THORACIC SPINE FINDINGS Alignment:  Mildly exaggerated thoracic kyphosis. Vertebrae: Endplate irregularity and sclerotic changes at T7-8 with T2 hyperintensity and contrast enhancement of the corresponding intervertebral disc. Mild chronic wedging  of T6 and T11. Cord:  Normal signal and morphology. Paraspinal and other soft tissues: Bilateral pleural effusions, left greater than right. Disc levels: T1-2: No spinal canal or neural foraminal stenosis. T2-3:  No spinal canal or neural foraminal stenosis. T3-4:  No spinal canal or neural foraminal stenosis. T4-5: Posterior disc protrusion without significant spinal canal or neural foraminal stenosis. T5-6: Left posterolateral disc protrusion without significant spinal canal or neural foraminal stenosis. T6-7:  No spinal canal or neural foraminal stenosis. T7-8: Posterior disc protrusion resulting mild bilateral neural foraminal narrowing. No significant spinal canal stenosis. T8-9: Left posterolateral disc protrude causing indentation on the thecal sac, abutting the anterior cord surface resulting in mild left neural foraminal narrowing. No significant spinal canal stenosis. T9-10:  No spinal canal or neural foraminal stenosis. T10-11:  No spinal canal or neural  foraminal stenosis. T11-12:  No spinal canal or neural foraminal stenosis. IMPRESSION: 1. Motion degraded study, particularly cervical axial images. 2. Endplate irregularity and sclerotic changes at T7-8 with T2 hyperintensity and contrast enhancement of the corresponding intervertebral disc, may be degenerative versus discitis/osteomyelitis. 3. 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 4. No high-grade spinal canal stenosis at any level. 5. Bilateral pleural effusions, left greater than right. Electronically Signed   By: Baldemar Lenis M.D.   On: 02/12/2020 12:59   MR THORACIC SPINE W WO CONTRAST  Result Date: 02/12/2020 CLINICAL DATA:  Epidural abscess. EXAM: MRI CERVICAL AND THORACIC SPINE WITHOUT AND WITH CONTRAST TECHNIQUE: Multiplanar and multiecho pulse sequences of the cervical spine, to include the craniocervical junction and cervicothoracic junction, and the thoracic spine, were obtained without and with intravenous contrast. CONTRAST:  8mL GADAVIST GADOBUTROL 1 MMOL/ML IV SOLN COMPARISON:  None. FINDINGS: MRI CERVICAL SPINE FINDINGS Images are significantly degraded by motion, particularly axial views. Alignment: Straightening of the cervical curvature. Small anterolisthesis of C2 over C3 and small retrolisthesis of C3 over C4. Vertebrae: No fracture, evidence of discitis, or bone lesion. Cord: No gross cord signal abnormality. Posterior Fossa, vertebral arteries, paraspinal tissues: Negative. Disc levels: C2-3: Facet degenerative changes resulting mild left neural foraminal narrowing. No spinal canal stenosis. C3-4: Posterior disc protrusion resulting in mild spinal canal stenosis. Uncovertebral and facet degenerative change resulting in moderate bilateral neural foraminal narrowing. C4-5: Posterior disc protrusion resulting in mild spinal canal stenosis. Uncovertebral and facet degenerative changes resulting in severe  right and moderate left neural foraminal narrowing. C5-6: Posterior disc protrusion resulting mild spinal canal stenosis. Uncovertebral and facet degenerative changes resulting in moderate bilateral neural foraminal narrowing. C6-7: Posterior disc protrusion resulting mild spinal canal stenosis. Uncovertebral and facet degenerative changes resulting in mild bilateral neural foraminal narrowing. C7-T1: No spinal canal or neural foraminal stenosis. MRI THORACIC SPINE FINDINGS Alignment:  Mildly exaggerated thoracic kyphosis. Vertebrae: Endplate irregularity and sclerotic changes at T7-8 with T2 hyperintensity and contrast enhancement of the corresponding intervertebral disc. Mild chronic wedging of T6 and T11. Cord:  Normal signal and morphology. Paraspinal and other soft tissues: Bilateral pleural effusions, left greater than right. Disc levels: T1-2: No spinal canal or neural foraminal stenosis. T2-3:  No spinal canal or neural foraminal stenosis. T3-4:  No spinal canal or neural foraminal stenosis. T4-5: Posterior disc protrusion without significant spinal canal or neural foraminal stenosis. T5-6: Left posterolateral disc protrusion without significant spinal canal or neural foraminal stenosis. T6-7:  No spinal canal or neural foraminal stenosis. T7-8: Posterior disc protrusion resulting mild bilateral neural foraminal  narrowing. No significant spinal canal stenosis. T8-9: Left posterolateral disc protrude causing indentation on the thecal sac, abutting the anterior cord surface resulting in mild left neural foraminal narrowing. No significant spinal canal stenosis. T9-10:  No spinal canal or neural foraminal stenosis. T10-11:  No spinal canal or neural foraminal stenosis. T11-12:  No spinal canal or neural foraminal stenosis. IMPRESSION: 1. Motion degraded study, particularly cervical axial images. 2. Endplate irregularity and sclerotic changes at T7-8 with T2 hyperintensity and contrast enhancement of the  corresponding intervertebral disc, may be degenerative versus discitis/osteomyelitis. 3. 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 4. No high-grade spinal canal stenosis at any level. 5. Bilateral pleural effusions, left greater than right. Electronically Signed   By: Baldemar Lenis M.D.   On: 02/12/2020 12:59   DG CHEST PORT 1 VIEW  Result Date: 02/13/2020 CLINICAL DATA:  80 year old male with hypoxia.  COVID-19. EXAM: PORTABLE CHEST 1 VIEW COMPARISON:  Chest CTA 02/02/2020. Portable chest 02/10/2020 and earlier. FINDINGS: Portable AP semi upright view at 0431 hours. The patient is less rotated today. Lung volumes are improved. Mediastinal contour appears stable with moderate to large hiatal hernia demonstrated by CTA. Visualized tracheal air column is within normal limits. Left greater than right confluent bilateral pulmonary opacity. Right upper lobe remains relatively spared. Ventilation is stable. No pneumothorax or definite pleural effusion. Stable visualized osseous structures. IMPRESSION: Stable COVID-19 pneumonia since 02/10/2020. Electronically Signed   By: Odessa Fleming M.D.   On: 02/13/2020 05:45   ECHOCARDIOGRAM LIMITED  Result Date: 02/11/2020    ECHOCARDIOGRAM LIMITED REPORT   Patient Name:   Javier Beard Date of Exam: 02/11/2020 Medical Rec #:  [id number]     Height:       66.0 in Accession #:    [id number]    Weight:       170.9 lb Date of Birth:  1940/10/19     BSA:          1.870 m Patient Age:    79 years      BP:           142/84 mmHg Patient Gender: M             HR:           89 bpm. Exam Location:  Inpatient Procedure: Limited Echo Indications:    Bacteremia  History:        Patient has prior history of Echocardiogram examinations, most                 recent 02/02/2020. Covid 19 positive.  Sonographer:    Roosvelt Maser RDCS Referring Phys: [id number] MATTHEW R HUNSUCKER IMPRESSIONS  1. Left ventricular ejection  fraction, by estimation, is 60 to 65%. The left ventricle has normal function.  2. Right ventricular systolic function is normal. The right ventricular size is mildly enlarged.  3. The mitral valve is normal in structure. Trivial mitral valve regurgitation.  4. Tricuspid valve regurgitation is moderate.  5. The aortic valve is tricuspid. There is mild calcification of the aortic valve. Aortic valve regurgitation is trivial. Comparison(s): A prior study was performed on 01/31/2020. Prior images reviewed side by side. Compared to both recent studies; Improvement in RV function from 02/02/20. Similar tricuspid regurgitation from 01/31/20. Vegetation not visualized in this study. FINDINGS  Left Ventricle: Left ventricular ejection fraction, by estimation, is 60 to 65%. The left ventricle has normal function. Right  Ventricle: The right ventricular size is mildly enlarged. Right ventricular systolic function is normal. Pericardium: There is no evidence of pericardial effusion. Mitral Valve: The mitral valve is normal in structure. Trivial mitral valve regurgitation. Tricuspid Valve: The tricuspid valve is grossly normal. Tricuspid valve regurgitation is moderate. Aortic Valve: The aortic valve is tricuspid. There is mild calcification of the aortic valve. There is moderate aortic valve annular calcification. Aortic valve regurgitation is trivial. Pulmonic Valve: The pulmonic valve was grossly normal. Pulmonic valve regurgitation is not visualized. Venous: The inferior vena cava was not well visualized. TRICUSPID VALVE TR Peak grad:   34.1 mmHg TR Vmax:        292.00 cm/s Riley Lam MD Electronically signed by Riley Lam MD Signature Date/Time: 02/11/2020/5:33:39 PM    Final       Assessment/Plan:  INTERVAL HISTORY:  MRI without infection  Platelets trending down  Principal Problem:   Acute hypoxemic respiratory failure due to COVID-19 [facility]) Active Problems:   COVID-19 virus infection   Type  2 diabetes mellitus with vascular disease (HCC)   Rheumatoid arthritis (HCC)   PAF (paroxysmal atrial fibrillation) (HCC)   CAD (coronary artery disease)   Pneumonia due to COVID-19 virus   Hyponatremia   Multifocal pneumonia   Acute respiratory distress   Acute respiratory failure with hypoxia (HCC)   MSSA bacteremia    THERRON SELLS is a 80 y.o. male with history of rheumatoid arthritis, prior amputation of a toe, admission with COVID-pneumonia now found to have methicillin sensitive Staph aureus bacteremia.  #1 Methicillin sensitive Staphylococcus aureus bacteremia:   Certainly could have developed this as a superinfection or secondary infection after his COVID-pneumonia    MRI of his cervical and thoracic spine motion degraded but no infection  I would plan on giving him at least 4 weeks of systemic therapy.  TEE if cardiology will do one on him we are getting close to him being at 21 days  #2 TTpenia worsening: Dr Rito Ehrlich concerned it could be due to beta lacta, has stopped Hydroxychloroquine  If need to switch from beta-lactam can go to vancomycin, will discuss further with ID pharmacy  #2 rheumatoid arthritis:  Would NOT let him have Simponi when he leaves and make sure he has completed treatment for his infection FIRST  #3 pressure ulcer: Continue to monitor  #4 COVID: has been treated for this and is in airborne, contact   LOS: 17 days   Acey Lav 02/13/2020, 12:08 PM

## 2020-02-13 NOTE — Progress Notes (Addendum)
PROGRESS NOTE  Javier HILLEARY WUJ:[id number] DOB: Sep 08, 1940 DOA: 02-23-2020  PCP: Lenox Ponds, MD  Brief History/Interval Summary: 80 y.o. 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   Early morning hours of 1/12 he became acutely more dyspneic and had increasing oxygen requirements from 3L to 15L and non-rebreather.  He has not had increasing leukocytosis and no known HF.   ABG 7.4/26/188/19.   PCCM consulted for possible Bipap and ICU transfer. Pulmonary critical care called back 02/09/2020 for acute decompensation after 13 days of treatment of COVID.  Noted to have worsening renal failure, worsening mental status, and hypotension.    Patient subsequently found to have MSSA bacteremia.  ID was consulted.   Consultants: Pulmonary and critical care medicine.  Infectious disease.  Significant Hospital Events:  1/8 Admitted 1/12 PCCM consult, transfer to progressive  1/21 Worsened hypoxemia, septic shock due to pna on pressors - transferred to ICU 1/22 Cr improved, pressors weaned off 1/23 Cr better, remains off pressors, O2 slowly weaning   Significant Diagnostic Tests:  1/12 CXR>>Increasing heterogeneous opacities throughout both lungs, concerning for worsening infection in the setting of COVID-19 1/14 Bubble study> LVEF 60-65%, no shunt. McConnels sign positive.  Echo 1/23 > LVEF 60-65%, RV function normal with mild enlargement. Moderate TR.    Antibiotics: Anti-infectives (From admission, onward)   Start     Dose/Rate Route Frequency Ordered Stop   02/09/20 2300  ceFAZolin (ANCEF) IVPB 2g/100 mL premix        2 g 200 mL/hr over 30 Minutes Intravenous Every 8 hours 02/09/20 2233     02/09/20 1000  ceFEPIme (MAXIPIME) 1 g in sodium chloride 0.9 % 100 mL IVPB  Status:   Discontinued        1 g 200 mL/hr over 30 Minutes Intravenous Every 12 hours 02/09/20 0846 02/09/20 2233   02/09/20 1000  linezolid (ZYVOX) IVPB 600 mg  Status:  Discontinued        600 mg 300 mL/hr over 60 Minutes Intravenous Every 12 hours 02/09/20 0846 02/09/20 2233   01/27/20 1000  remdesivir 100 mg in sodium chloride 0.9 % 100 mL IVPB        100 mg 200 mL/hr over 30 Minutes Intravenous Daily 2020/02/23 1602 01/30/20 1418   01/27/20 1000  hydroxychloroquine (PLAQUENIL) tablet 200 mg        200 mg Oral 2 times daily 01/27/20 0358     2020-02-23 1630  remdesivir 100 mg in sodium chloride 0.9 % 100 mL IVPB        100 mg 200 mL/hr over 30 Minutes Intravenous Every 30 min 02-23-2020 1602 02/23/2020 1745      Subjective/Interval History: Patient complains of back pain.  Also complains of difficulty breathing even with minimal exertion.  Denies chest pain.  No nausea vomiting.    Assessment/Plan:  Acute Hypoxic Resp. Failure/Pneumonia due to COVID-19 Patient has had a prolonged hospital stay.  Patient previously treated for COVID-19 with Remdesivir and steroids.  Patient was not given baricitinib or Actemra due to immunosuppressed status.  Patient's respiratory status was improving.  Patient was down to 3 to 4 L of oxygen yesterday.  However upon my He was being transferred to another unit he became more hypoxic.  Currently requiring high flow nasal cannula 30 L/min along with a  nonrebreather.  Saturations however noted to be in the late 90s.  Chest x-ray was done earlier this morning which showed stable findings.  Concern for volume overload.  Discussed with pulmonology who saw him yesterday.  Lasix will be ordered.  Continue ICU setting for now.  MSSA pneumonia/bacteremia/septic shock ID is following.  Echocardiogram was done which does not show any obvious vegetations.  Would like to do TEE but his respiratory status currently will not allow it. There was also concern for discitis as patient is  complaining of neck and back pain.  MRI of the cervical and thoracic spine was done yesterday.  It was a motion degraded study.  Some irregularity and sclerotic changes were noted in T7-8 raising concern for discitis.  Mild cervical stenosis noted. Patient currently on cefazolin.  Chronic atrial fibrillation Patient on Eliquis.  History of rheumatoid arthritis Methotrexate on hold.  Plaquenil being continued.  Patient on prednisone 5 mg daily at home.  Currently on tapering prednisone. Also on Simponi every 8 weeks.  Per ID this medication should NOT be restarted until he has completed treatment for his MSSA infection.  Acute kidney injury/hyponatremia Likely due to septic shock/hypovolemia/ATN.  Improved.  Monitor sodium levels.  Normocytic anemia No evidence of overt bleeding.  Drop in hemoglobin as noted.  Possibly due to dilution.  Continue to trend.  Thrombocytopenia Platelet count noted to be slowly trending downwards.  Possibly due to sepsis.  Patient however is on apixaban.  We will recheck labs tomorrow. Medications reviewed.  For now we will hold his hydroxychloroquine.  Check peripheral smear. Cefazolin can also cause thrombocytopenia.  We will run this by ID.  Toxic metabolic encephalopathy In the setting of sepsis, renal failure, hypoxemia ICU stay.  Improving.  Pressure injury Pressure Injury 02/09/20 Sacrum Medial Deep Tissue Pressure Injury - Purple or maroon localized area of discolored intact skin or blood-filled blister due to damage of underlying soft tissue from pressure and/or shear. (Active)  02/09/20 0900  Location: Sacrum  Location Orientation: Medial  Staging: Deep Tissue Pressure Injury - Purple or maroon localized area of discolored intact skin or blood-filled blister due to damage of underlying soft tissue from pressure and/or shear.  Wound Description (Comments):   Present on Admission: Yes     DVT Prophylaxis: On apixaban Code Status: DNR Family  Communication: Discussed with patient.  Will update family later today Disposition Plan: SNF recommended by PT  Status is: Inpatient  Remains inpatient appropriate because:IV treatments appropriate due to intensity of illness or inability to take PO and Inpatient level of care appropriate due to severity of illness   Dispo: The patient is from: Home              Anticipated d/c is to: SNF              Anticipated d/c date is: > 3 days              Patient currently is not medically stable to d/c.   Difficult to place patient No    ]  Medications:  Scheduled: . apixaban  5 mg Oral BID  . vitamin C  500 mg Oral Daily  . calcium carbonate  1,250 mg Oral Q breakfast  . calcium carbonate  0.5 tablet Oral Q supper  . chlorhexidine  15 mL Mouth Rinse BID  . Chlorhexidine Gluconate Cloth  6 each Topical Daily  . docusate sodium  100 mg Oral BID  . feeding supplement  237 mL Oral TID BM  . folic acid  1 mg Oral Daily  . furosemide  40 mg Intravenous Once  . glipiZIDE  2.5 mg Oral QAC breakfast  . hydroxychloroquine  200 mg Oral BID  . insulin aspart  0-20 Units Subcutaneous TID WC  . insulin aspart  0-5 Units Subcutaneous QHS  . mouth rinse  15 mL Mouth Rinse BID  . pantoprazole  40 mg Oral Daily  . polyethylene glycol  17 g Oral Daily  . pravastatin  80 mg Oral q1800  . predniSONE  20 mg Oral Q breakfast   Followed by  . [START ON 02/18/2020] predniSONE  10 mg Oral Q breakfast   Followed by  . [START ON 02/23/2020] predniSONE  5 mg Oral Q breakfast  . vitamin B-12  1,000 mcg Oral Daily  . zinc sulfate  220 mg Oral Daily   Continuous: . sodium chloride 10 mL/hr at 02/13/20 1054  .  ceFAZolin (ANCEF) IV Stopped (02/13/20 1036)   AOZ:HYQMVHQIONGEX **OR** acetaminophen, senna-docusate   Objective:  Vital Signs  Vitals:   02/13/20 0800 02/13/20 0832 02/13/20 0900 02/13/20 1000  BP: 113/64 113/64 107/62   Pulse: 87 92 78 94  Resp: (!) 22 (!) 28 (!) 27 (!) 32  Temp:       TempSrc:      SpO2: 100%  100% 98%  Weight:      Height:        Intake/Output Summary (Last 24 hours) at 02/13/2020 1138 Last data filed at 02/13/2020 1054 Gross per 24 hour  Intake 1047.93 ml  Output 350 ml  Net 697.93 ml   Filed Weights   02/03/20 0410 02/09/20 0915 02/12/20 0412  Weight: 72.6 kg 77.5 kg 79.6 kg    General appearance: Awake alert.  In no distress.  Mildly distracted Resp: Noted to be tachypneic.  No use of accessory muscles.  Crackles bilateral bases.  No wheezing or rhonchi. Cardio: S1-S2 is normal regular.  No S3-S4.  No rubs murmurs or bruit GI: Abdomen is soft.  Nontender nondistended.  Bowel sounds are present normal.  No masses organomegaly Extremities: No edema.   Neurologic:  No focal neurological deficits.    Lab Results:  Data Reviewed: I have personally reviewed following labs and imaging studies  CBC: Recent Labs  Lab 02/07/20 0353 02/08/20 0234 02/09/20 0201 02/10/20 0308 02/11/20 0209 02/12/20 0418  WBC 12.5* 12.9* 30.4* 24.3* 15.6* 9.8  NEUTROABS 11.1*  --   --   --   --   --   HGB 12.2* 11.4* 12.6* 11.6* 9.5* 9.7*  HCT 36.3* 32.9* 38.3* 34.9* 27.8* 30.3*  MCV 91.2 91.4 93.6 97.5 93.0 95.3  PLT 167 169 199 PLATELET CLUMPS NOTED ON SMEAR, UNABLE TO ESTIMATE 83* 63*    Basic Metabolic Panel: Recent Labs  Lab 02/09/20 0201 02/10/20 0308 02/11/20 0209 02/12/20 0418 02/13/20 0843  NA 132* 135 132* 130* 131*  K 4.4 5.2* 3.8 3.8 4.0  CL 102 106 105 104 104  CO2 17* 15* 19* 18* 18*  GLUCOSE 76 208* 102* 127* 58*  BUN 38* 31* CREATININE 1.88* 1.32* 0.77 0.69 0.70  CALCIUM 8.0* 7.5* 7.4* 7.5* 7.5*  MG 2.0 2.1 2.1 2.1 2.0  PHOS  --  4.0  --   --   --     GFR: Estimated Creatinine Clearance: 76.7 mL/min (by C-G formula based on SCr of 0.7 mg/dL).   Recent Labs  Lab  02/09/20 0959  AMMONIA 30     CBG: Recent Labs  Lab 02/12/20 0719 02/12/20 1314 02/12/20 1725 02/12/20 2131 02/13/20 0746  GLUCAP 110* 87  158* 153* 71     Recent Results (from the past 240 hour(s))  Culture, blood (Routine X 2) w Reflex to ID Panel     Status: Abnormal   Collection Time: 02/09/20  8:10 AM   Specimen: BLOOD LEFT HAND  Result Value Ref Range Status   Specimen Description BLOOD LEFT HAND  Final   Special Requests   Final    BOTTLES DRAWN AEROBIC ONLY Blood Culture results may not be optimal due to an inadequate volume of blood received in culture bottles   Culture  Setup Time   Final    GRAM POSITIVE COCCI IN CLUSTERS AEROBIC BOTTLE ONLY CRITICAL VALUE NOTED.  VALUE IS CONSISTENT WITH PREVIOUSLY REPORTED AND CALLED VALUE.    Culture (A)  Final    STAPHYLOCOCCUS AUREUS SUSCEPTIBILITIES PERFORMED ON PREVIOUS CULTURE WITHIN THE LAST 5 DAYS. Performed at [facility], [address], [place], [place] [address]    Report Status 02/11/2020 FINAL  Final  Culture, blood (Routine X 2) w Reflex to ID Panel     Status: Abnormal   Collection Time: 02/09/20  8:22 AM   Specimen: BLOOD RIGHT HAND  Result Value Ref Range Status   Specimen Description BLOOD RIGHT HAND  Final   Special Requests   Final    BOTTLES DRAWN AEROBIC AND ANAEROBIC Blood Culture results may not be optimal due to an inadequate volume of blood received in culture bottles   Culture  Setup Time   Final    GRAM POSITIVE COCCI IN CLUSTERS IN BOTH AEROBIC AND ANAEROBIC BOTTLES Organism ID to follow CRITICAL RESULT CALLED TO, READ BACK BY AND VERIFIED WITH: C AMEND [facility] 02/09/20 2216 JDW Performed at [facility], [address] [place], [place] [address]    Culture STAPHYLOCOCCUS AUREUS (A)  Final   Report Status 02/11/2020 FINAL  Final   Organism ID, Bacteria STAPHYLOCOCCUS AUREUS  Final      Susceptibility   Staphylococcus aureus - MIC*    CIPROFLOXACIN <=0.5 SENSITIVE Sensitive     ERYTHROMYCIN <=0.25 SENSITIVE Sensitive     GENTAMICIN <=0.5 SENSITIVE Sensitive     OXACILLIN 0.5 SENSITIVE Sensitive     TETRACYCLINE <=1  SENSITIVE Sensitive     VANCOMYCIN 1 SENSITIVE Sensitive     TRIMETH/SULFA <=10 SENSITIVE Sensitive     CLINDAMYCIN <=0.25 SENSITIVE Sensitive     RIFAMPIN <=0.5 SENSITIVE Sensitive     Inducible Clindamycin NEGATIVE Sensitive     * STAPHYLOCOCCUS AUREUS  Blood Culture ID Panel (Reflexed)     Status: Abnormal   Collection Time: 02/09/20  8:22 AM  Result Value Ref Range Status   Enterococcus faecalis NOT DETECTED NOT DETECTED Final   Enterococcus Faecium NOT DETECTED NOT DETECTED Final   Listeria monocytogenes NOT DETECTED NOT DETECTED Final   Staphylococcus species DETECTED (A) NOT DETECTED Final    Comment: CRITICAL RESULT CALLED TO, READ BACK BY AND VERIFIED WITH: C AMEND PHARMD 02/09/20 2216 JDW    Staphylococcus aureus (BCID) DETECTED (A) NOT DETECTED Final    Comment: CRITICAL RESULT CALLED TO, READ BACK BY AND VERIFIED WITH: C AMEND PHARMD 02/09/20 2216 JDW    Staphylococcus epidermidis NOT DETECTED NOT DETECTED Final   Staphylococcus lugdunensis NOT DETECTED NOT DETECTED Final   Streptococcus species NOT DETECTED NOT DETECTED Final  Streptococcus agalactiae NOT DETECTED NOT DETECTED Final   Streptococcus pneumoniae NOT DETECTED NOT DETECTED Final   Streptococcus pyogenes NOT DETECTED NOT DETECTED Final   A.calcoaceticus-baumannii NOT DETECTED NOT DETECTED Final   Bacteroides fragilis NOT DETECTED NOT DETECTED Final   Enterobacterales NOT DETECTED NOT DETECTED Final   Enterobacter cloacae complex NOT DETECTED NOT DETECTED Final   Escherichia coli NOT DETECTED NOT DETECTED Final   Klebsiella aerogenes NOT DETECTED NOT DETECTED Final   Klebsiella oxytoca NOT DETECTED NOT DETECTED Final   Klebsiella pneumoniae NOT DETECTED NOT DETECTED Final   Proteus species NOT DETECTED NOT DETECTED Final   Salmonella species NOT DETECTED NOT DETECTED Final   Serratia marcescens NOT DETECTED NOT DETECTED Final   Haemophilus influenzae NOT DETECTED NOT DETECTED Final   Neisseria meningitidis  NOT DETECTED NOT DETECTED Final   Pseudomonas aeruginosa NOT DETECTED NOT DETECTED Final   Stenotrophomonas maltophilia NOT DETECTED NOT DETECTED Final   Candida albicans NOT DETECTED NOT DETECTED Final   Candida auris NOT DETECTED NOT DETECTED Final   Candida glabrata NOT DETECTED NOT DETECTED Final   Candida krusei NOT DETECTED NOT DETECTED Final   Candida parapsilosis NOT DETECTED NOT DETECTED Final   Candida tropicalis NOT DETECTED NOT DETECTED Final   Cryptococcus neoformans/gattii NOT DETECTED NOT DETECTED Final   Meth resistant mecA/C and MREJ NOT DETECTED NOT DETECTED Final    Comment: Performed at [facility], [address], [place], [place] [address]  MRSA PCR Screening     Status: None   Collection Time: 02/09/20  9:45 AM   Specimen: Nasal Mucosa; Nasopharyngeal  Result Value Ref Range Status   MRSA by PCR NEGATIVE NEGATIVE Final    Comment:        The GeneXpert MRSA Assay (FDA approved for NASAL specimens only), is one component of a comprehensive MRSA colonization surveillance program. It is not intended to diagnose MRSA infection nor to guide or monitor treatment for MRSA infections. Performed at [facility], [address], [place], [place] [address]   Culture, blood (Routine X 2) w Reflex to ID Panel     Status: Abnormal   Collection Time: 02/10/20  3:30 AM   Specimen: BLOOD LEFT HAND  Result Value Ref Range Status   Specimen Description BLOOD LEFT HAND  Final   Special Requests   Final    AEROBIC BOTTLE ONLY Blood Culture results may not be optimal due to an inadequate volume of blood received in culture bottles   Culture  Setup Time   Final    GRAM POSITIVE COCCI IN CLUSTERS AEROBIC BOTTLE ONLY CRITICAL VALUE NOTED.  VALUE IS CONSISTENT WITH PREVIOUSLY REPORTED AND CALLED VALUE.    Culture (A)  Final    STAPHYLOCOCCUS CAPITIS THE SIGNIFICANCE OF ISOLATING THIS ORGANISM FROM A SINGLE SET OF BLOOD CULTURES WHEN MULTIPLE SETS ARE DRAWN IS  UNCERTAIN. PLEASE NOTIFY THE MICROBIOLOGY DEPARTMENT WITHIN ONE WEEK IF SPECIATION AND SENSITIVITIES ARE REQUIRED. Performed at [facility], [address] [place], [place] [address]    Report Status 02/13/2020 FINAL  Final  Culture, blood (Routine X 2) w Reflex to ID Panel     Status: None (Preliminary result)   Collection Time: 02/10/20  6:37 PM   Specimen: BLOOD  Result Value Ref Range Status   Specimen Description BLOOD SITE NOT SPECIFIED  Final   Special Requests   Final    BOTTLES DRAWN AEROBIC AND ANAEROBIC Blood Culture adequate volume   Culture  Final    NO GROWTH 2 DAYS Performed at [facility], [address] [place], [place] [address]    Report Status PENDING  Incomplete      Radiology Studies: MR CERVICAL SPINE W WO CONTRAST  Result Date: 02/12/2020 CLINICAL DATA:  Epidural abscess. EXAM: MRI CERVICAL AND THORACIC SPINE WITHOUT AND WITH CONTRAST TECHNIQUE: Multiplanar and multiecho pulse sequences of the cervical spine, to include the craniocervical junction and cervicothoracic junction, and the thoracic spine, were obtained without and with intravenous contrast. CONTRAST:  8mL GADAVIST GADOBUTROL 1 MMOL/ML IV SOLN COMPARISON:  None. FINDINGS: MRI CERVICAL SPINE FINDINGS Images are significantly degraded by motion, particularly axial views. Alignment: Straightening of the cervical curvature. Small anterolisthesis of C2 over C3 and small retrolisthesis of C3 over C4. Vertebrae: No fracture, evidence of discitis, or bone lesion. Cord: No gross cord signal abnormality. Posterior Fossa, vertebral arteries, paraspinal tissues: Negative. Disc levels: C2-3: Facet degenerative changes resulting mild left neural foraminal narrowing. No spinal canal stenosis. C3-4: Posterior disc protrusion resulting in mild spinal canal stenosis. Uncovertebral and facet degenerative change resulting in moderate bilateral neural foraminal narrowing. C4-5: Posterior disc protrusion resulting  in mild spinal canal stenosis. Uncovertebral and facet degenerative changes resulting in severe right and moderate left neural foraminal narrowing. C5-6: Posterior disc protrusion resulting mild spinal canal stenosis. Uncovertebral and facet degenerative changes resulting in moderate bilateral neural foraminal narrowing. C6-7: Posterior disc protrusion resulting mild spinal canal stenosis. Uncovertebral and facet degenerative changes resulting in mild bilateral neural foraminal narrowing. C7-T1: No spinal canal or neural foraminal stenosis. MRI THORACIC SPINE FINDINGS Alignment:  Mildly exaggerated thoracic kyphosis. Vertebrae: Endplate irregularity and sclerotic changes at T7-8 with T2 hyperintensity and contrast enhancement of the corresponding intervertebral disc. Mild chronic wedging of T6 and T11. Cord:  Normal signal and morphology. Paraspinal and other soft tissues: Bilateral pleural effusions, left greater than right. Disc levels: T1-2: No spinal canal or neural foraminal stenosis. T2-3:  No spinal canal or neural foraminal stenosis. T3-4:  No spinal canal or neural foraminal stenosis. T4-5: Posterior disc protrusion without significant spinal canal or neural foraminal stenosis. T5-6: Left posterolateral disc protrusion without significant spinal canal or neural foraminal stenosis. T6-7:  No spinal canal or neural foraminal stenosis. T7-8: Posterior disc protrusion resulting mild bilateral neural foraminal narrowing. No significant spinal canal stenosis. T8-9: Left posterolateral disc protrude causing indentation on the thecal sac, abutting the anterior cord surface resulting in mild left neural foraminal narrowing. No significant spinal canal stenosis. T9-10:  No spinal canal or neural foraminal stenosis. T10-11:  No spinal canal or neural foraminal stenosis. T11-12:  No spinal canal or neural foraminal stenosis. IMPRESSION: 1. Motion degraded study, particularly cervical axial images. 2. Endplate  irregularity and sclerotic changes at T7-8 with T2 hyperintensity and contrast enhancement of the corresponding intervertebral disc, may be degenerative versus discitis/osteomyelitis. 3. 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 4. No high-grade spinal canal stenosis at any level. 5. Bilateral pleural effusions, left greater than right. Electronically Signed   By: Baldemar Lenis M.D.   On: 02/12/2020 12:59   MR THORACIC SPINE W WO CONTRAST  Result Date: 02/12/2020 CLINICAL DATA:  Epidural abscess. EXAM: MRI CERVICAL AND THORACIC SPINE WITHOUT AND WITH CONTRAST TECHNIQUE: Multiplanar and multiecho pulse sequences of the cervical spine, to include the craniocervical junction and cervicothoracic junction, and the thoracic spine, were obtained without and with intravenous contrast. CONTRAST:  8mL  GADAVIST GADOBUTROL 1 MMOL/ML IV SOLN COMPARISON:  None. FINDINGS: MRI CERVICAL SPINE FINDINGS Images are significantly degraded by motion, particularly axial views. Alignment: Straightening of the cervical curvature. Small anterolisthesis of C2 over C3 and small retrolisthesis of C3 over C4. Vertebrae: No fracture, evidence of discitis, or bone lesion. Cord: No gross cord signal abnormality. Posterior Fossa, vertebral arteries, paraspinal tissues: Negative. Disc levels: C2-3: Facet degenerative changes resulting mild left neural foraminal narrowing. No spinal canal stenosis. C3-4: Posterior disc protrusion resulting in mild spinal canal stenosis. Uncovertebral and facet degenerative change resulting in moderate bilateral neural foraminal narrowing. C4-5: Posterior disc protrusion resulting in mild spinal canal stenosis. Uncovertebral and facet degenerative changes resulting in severe right and moderate left neural foraminal narrowing. C5-6: Posterior disc protrusion resulting mild spinal canal stenosis. Uncovertebral and facet degenerative  changes resulting in moderate bilateral neural foraminal narrowing. C6-7: Posterior disc protrusion resulting mild spinal canal stenosis. Uncovertebral and facet degenerative changes resulting in mild bilateral neural foraminal narrowing. C7-T1: No spinal canal or neural foraminal stenosis. MRI THORACIC SPINE FINDINGS Alignment:  Mildly exaggerated thoracic kyphosis. Vertebrae: Endplate irregularity and sclerotic changes at T7-8 with T2 hyperintensity and contrast enhancement of the corresponding intervertebral disc. Mild chronic wedging of T6 and T11. Cord:  Normal signal and morphology. Paraspinal and other soft tissues: Bilateral pleural effusions, left greater than right. Disc levels: T1-2: No spinal canal or neural foraminal stenosis. T2-3:  No spinal canal or neural foraminal stenosis. T3-4:  No spinal canal or neural foraminal stenosis. T4-5: Posterior disc protrusion without significant spinal canal or neural foraminal stenosis. T5-6: Left posterolateral disc protrusion without significant spinal canal or neural foraminal stenosis. T6-7:  No spinal canal or neural foraminal stenosis. T7-8: Posterior disc protrusion resulting mild bilateral neural foraminal narrowing. No significant spinal canal stenosis. T8-9: Left posterolateral disc protrude causing indentation on the thecal sac, abutting the anterior cord surface resulting in mild left neural foraminal narrowing. No significant spinal canal stenosis. T9-10:  No spinal canal or neural foraminal stenosis. T10-11:  No spinal canal or neural foraminal stenosis. T11-12:  No spinal canal or neural foraminal stenosis. IMPRESSION: 1. Motion degraded study, particularly cervical axial images. 2. Endplate irregularity and sclerotic changes at T7-8 with T2 hyperintensity and contrast enhancement of the corresponding intervertebral disc, may be degenerative versus discitis/osteomyelitis. 3. Multilevel degenerative changes of the cervical spine with mild spinal canal  stenosis from C3-4 through C6-7. 4. No high-grade spinal canal stenosis at any level. 5. Bilateral pleural effusions, left greater than right. Electronically Signed   By: Baldemar Lenis M.D.   On: 02/12/2020 12:59   DG CHEST PORT 1 VIEW  Result Date: 02/13/2020 CLINICAL DATA:  80 year old male with hypoxia.  COVID-19. EXAM: PORTABLE CHEST 1 VIEW COMPARISON:  Chest CTA 02/02/2020. Portable chest 02/10/2020 and earlier. FINDINGS: Portable AP semi upright view at 0431 hours. The patient is less rotated today. Lung volumes are improved. Mediastinal contour appears stable with moderate to large hiatal hernia demonstrated by CTA. Visualized tracheal air column is within normal limits. Left greater than right confluent bilateral pulmonary opacity. Right upper lobe remains relatively spared. Ventilation is stable. No pneumothorax or definite pleural effusion. Stable visualized osseous structures. IMPRESSION: Stable COVID-19 pneumonia since 02/10/2020. Electronically Signed   By: Odessa Fleming M.D.   On: 02/13/2020 05:45   ECHOCARDIOGRAM LIMITED  Result Date: 02/11/2020    ECHOCARDIOGRAM LIMITED REPORT   Patient Name:   ADE STMARIE Date of Exam: 02/11/2020 Medical Rec #:  [id number]     Height:       66.0 in Accession #:    [id number]    Weight:       170.9 lb Date of Birth:  February 14, 1940     BSA:          1.870 m Patient Age:    79 years      BP:           142/84 mmHg Patient Gender: M             HR:           89 bpm. Exam Location:  Inpatient Procedure: Limited Echo Indications:    Bacteremia  History:        Patient has prior history of Echocardiogram examinations, most                 recent 02/02/2020. Covid 19 positive.  Sonographer:    Roosvelt Maser RDCS Referring Phys: [id number] MATTHEW R HUNSUCKER IMPRESSIONS  1. Left ventricular ejection fraction, by estimation, is 60 to 65%. The left ventricle has normal function.  2. Right ventricular systolic function is normal. The right ventricular size is mildly  enlarged.  3. The mitral valve is normal in structure. Trivial mitral valve regurgitation.  4. Tricuspid valve regurgitation is moderate.  5. The aortic valve is tricuspid. There is mild calcification of the aortic valve. Aortic valve regurgitation is trivial. Comparison(s): A prior study was performed on 01/31/2020. Prior images reviewed side by side. Compared to both recent studies; Improvement in RV function from 02/02/20. Similar tricuspid regurgitation from 01/31/20. Vegetation not visualized in this study. FINDINGS  Left Ventricle: Left ventricular ejection fraction, by estimation, is 60 to 65%. The left ventricle has normal function. Right Ventricle: The right ventricular size is mildly enlarged. Right ventricular systolic function is normal. Pericardium: There is no evidence of pericardial effusion. Mitral Valve: The mitral valve is normal in structure. Trivial mitral valve regurgitation. Tricuspid Valve: The tricuspid valve is grossly normal. Tricuspid valve regurgitation is moderate. Aortic Valve: The aortic valve is tricuspid. There is mild calcification of the aortic valve. There is moderate aortic valve annular calcification. Aortic valve regurgitation is trivial. Pulmonic Valve: The pulmonic valve was grossly normal. Pulmonic valve regurgitation is not visualized. Venous: The inferior vena cava was not well visualized. TRICUSPID VALVE TR Peak grad:   34.1 mmHg TR Vmax:        292.00 cm/s Riley Lam MD Electronically signed by Riley Lam MD Signature Date/Time: 02/11/2020/5:33:39 PM    Final        LOS: 17 days   Osvaldo Shipper  [facility] Pager on [email or web address]  02/13/2020, 11:38 AM

## 2020-02-13 NOTE — Progress Notes (Signed)
eLink Physician-Brief Progress Note Patient Name: Javier Beard DOB: 1941-01-09 MRN: [id number]   Date of Service  02/13/2020  HPI/Events of Note  Hypoxia - Patient desaturated down into the 70's on [place] O2. Now on HFNC + NRB with sat = 95% and RR = 32.   eICU Interventions  Plan: 1. Cancel transfer to Progressive Care.  2. Portable CXR STAT.     Intervention Category Major Interventions: Hypoxemia - evaluation and management  Argentina Kosch Dennard Nip 02/13/2020, 4:31 AM

## 2020-02-13 NOTE — Progress Notes (Signed)
Pharmacy Antibiotic Note  Javier Beard is a 80 y.o. male admitted on 01/23/2020 with MSSA bacteremia.  Pharmacy has been consulted for vancomycin dosing. Patient was previously on cefazolin for his MSSA bacteremia, however he has developed thrombocytopenia without an obvious cause. Cefazolin has the potential to cause this in patients so the ID team is recommending changing to vancomycin to see if it will help his platelet recovery  Predicted AUC: 491 using Scr 0.7 and IBW   Plan: Vancomycin 1750mg  x1  Vancomycin 1000mg  IV q12h   Height: 5\' 6"  (167.6 cm) Weight: 79.6 kg (175 lb 7.8 oz) IBW/kg (Calculated) : 63.8  Temp (24hrs), Avg:97.5 F (36.4 C), Min:96.9 F (36.1 C), Max:97.9 F (36.6 C)  Recent Labs  Lab 02/08/20 0234 02/09/20 0201 02/10/20 0308 02/11/20 0209 02/12/20 0418 02/13/20 0843  WBC 12.9* 30.4* 24.3* 15.6* 9.8  --   CREATININE 0.90 1.88* 1.32* 0.77 0.69 0.70    Estimated Creatinine Clearance: 76.7 mL/min (by C-G formula based on SCr of 0.7 mg/dL).    No Known Allergies  Antimicrobials this admission: 1/21 linezolid x1 1/21 cefepime x1 1/22 cefazolin >>1/25 1/25 vancomycin >>   Microbiology results: 1/21 BCx: MSSA 1/22 BCx: staph captis 1/25 BCx pending  Thank you for allowing pharmacy to be a part of this patient's care.  2/21 02/13/2020 1:49 PM

## 2020-02-13 NOTE — Progress Notes (Signed)
Inpatient Diabetes Program Recommendations  AACE/ADA: New Consensus Statement on Inpatient Glycemic Control (2015)  Target Ranges:  Prepandial:   less than 140 mg/dL      Peak postprandial:   less than 180 mg/dL (1-2 hours)      Critically ill patients:  140 - 180 mg/dL   Lab Results  Component Value Date   GLUCAP 71 02/13/2020    Review of Glycemic Control Results for CRAWFORD, TAMURA (MRN [id number]) as of 02/13/2020 11:49  Ref. Range 02/12/2020 07:19 02/12/2020 13:14 02/12/2020 17:25 02/12/2020 21:31 02/13/2020 07:46  Glucose-Capillary Latest Ref Range: 70 - 99 mg/dL 536 (H) 87 644 (H) 034 (H) 71   Diabetes history: DM 2 Outpatient Diabetes medications:  Glucotrol 5 mg daily Current orders for Inpatient glycemic control:  Novolog resistant tid with meals, Prednisone taper Glucotrol 2.5 mg daily Inpatient Diabetes Program Recommendations:    Please consider reduction of Novolog correction to sensitive tid with meals and d/c Glucotrol.   Thanks  Beryl Meager, RN, BC-ADM Inpatient Diabetes Coordinator Pager [phone] (8a-5p)

## 2020-02-14 ENCOUNTER — Inpatient Hospital Stay (HOSPITAL_COMMUNITY): Payer: Medicare Other

## 2020-02-14 DIAGNOSIS — R0902 Hypoxemia: Secondary | ICD-10-CM

## 2020-02-14 DIAGNOSIS — R0603 Acute respiratory distress: Secondary | ICD-10-CM

## 2020-02-14 DIAGNOSIS — J9601 Acute respiratory failure with hypoxia: Secondary | ICD-10-CM | POA: Diagnosis not present

## 2020-02-14 DIAGNOSIS — E871 Hypo-osmolality and hyponatremia: Secondary | ICD-10-CM | POA: Diagnosis not present

## 2020-02-14 DIAGNOSIS — U071 COVID-19: Secondary | ICD-10-CM | POA: Diagnosis not present

## 2020-02-14 LAB — CBC
HCT: 32.1 % — ABNORMAL LOW (ref 39.0–52.0)
Hemoglobin: 10.9 g/dL — ABNORMAL LOW (ref 13.0–17.0)
MCH: 31.5 pg (ref 26.0–34.0)
MCHC: 34 g/dL (ref 30.0–36.0)
MCV: 92.8 fL (ref 80.0–100.0)
Platelets: 64 10*3/uL — ABNORMAL LOW (ref 150–400)
RBC: 3.46 MIL/uL — ABNORMAL LOW (ref 4.22–5.81)
RDW: 15.9 % — ABNORMAL HIGH (ref 11.5–15.5)
WBC: 8.8 10*3/uL (ref 4.0–10.5)
nRBC: 0 % (ref 0.0–0.2)

## 2020-02-14 LAB — BASIC METABOLIC PANEL
Anion gap: 10 (ref 5–15)
BUN: 18 mg/dL (ref 8–23)
CO2: 18 mmol/L — ABNORMAL LOW (ref 22–32)
Calcium: 7.6 mg/dL — ABNORMAL LOW (ref 8.9–10.3)
Chloride: 105 mmol/L (ref 98–111)
Creatinine, Ser: 0.7 mg/dL (ref 0.61–1.24)
GFR, Estimated: >60 mL/min (ref >60)
Glucose, Bld: 85 mg/dL (ref 70–99)
Potassium: 3.6 mmol/L (ref 3.5–5.1)
Sodium: 133 mmol/L — ABNORMAL LOW (ref 135–145)

## 2020-02-14 LAB — GLUCOSE, CAPILLARY
Glucose-Capillary: 80 mg/dL (ref 70–99)
Glucose-Capillary: 135 mg/dL — ABNORMAL HIGH (ref 70–99)
Glucose-Capillary: 151 mg/dL — ABNORMAL HIGH (ref 70–99)
Glucose-Capillary: 158 mg/dL — ABNORMAL HIGH (ref 70–99)

## 2020-02-14 LAB — MAGNESIUM: Magnesium: 2 mg/dL (ref 1.7–2.4)

## 2020-02-14 LAB — PATHOLOGIST SMEAR REVIEW

## 2020-02-14 LAB — C-REACTIVE PROTEIN: CRP: 13.8 mg/dL — ABNORMAL HIGH (ref <1.0)

## 2020-02-14 IMAGING — MR MR LUMBAR SPINE WO/W CM
4 of 7 series · 22 of 48 positions shown · IV contrast (gadavist)
Comparison: Prior MRI from [DATE].

CLINICAL DATA: Initial evaluation for back pain, epidural abscess
suspected.

EXAM:
MRI LUMBAR SPINE WITHOUT AND WITH CONTRAST
TECHNIQUE: Multiplanar and multiecho pulse sequences of the lumbar spine were
obtained without and with intravenous contrast.
CONTRAST:  7.5mL GADAVIST GADOBUTROL 1 MMOL/ML IV SOLN

[Series 5: T2 · sagittal · 4.0mm · 0.73mm/px · 4 of 16 slices shown (1 of 2)]
[im 1/16]
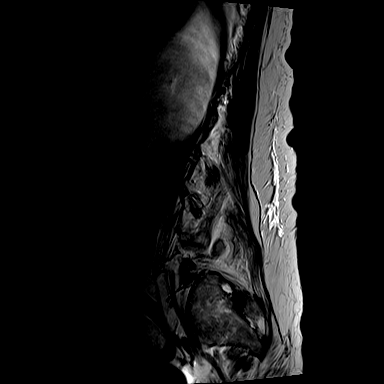
[im 6/16]
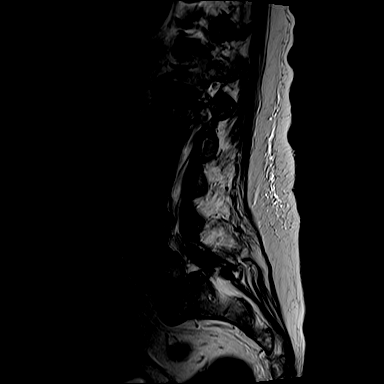
[im 11/16]
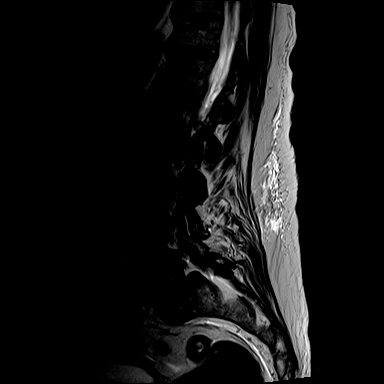
[im 16/16]
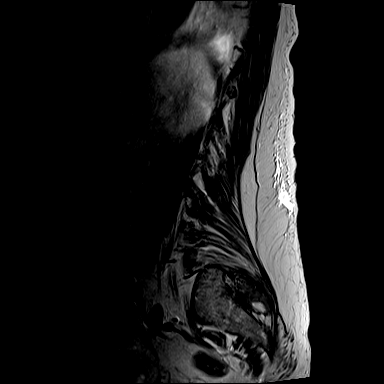

[Series 7: T1 · sagittal · 4.0mm · 0.88mm/px · 5 of 16 slices shown (1 of 2)]
[im 1/16]
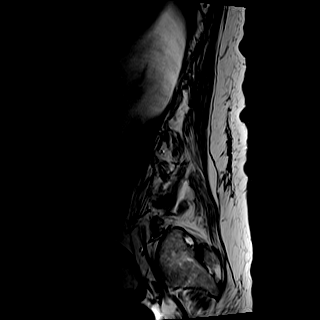
[im 4/16]
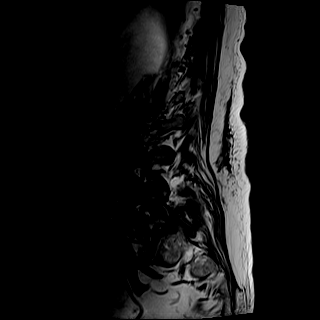
[im 8/16]
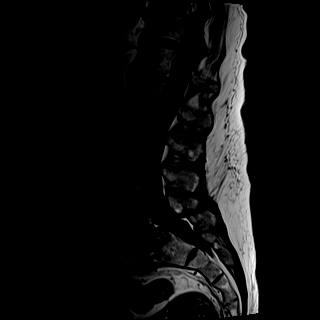
[im 12/16]
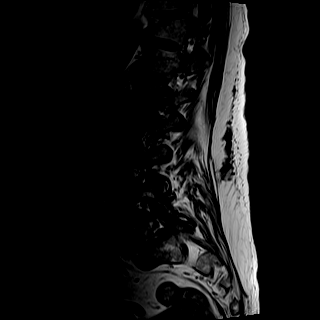
[im 16/16]
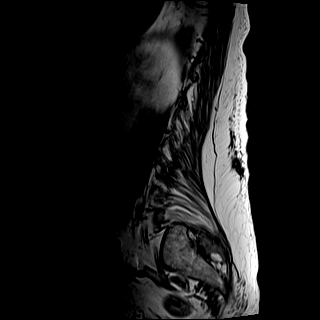

[Series 8: T2 · axial · 4.0mm · 0.57mm/px · z∈[-97,+121]mm · 8 of 34 slices shown (2 of 2)]
[im 1/34]
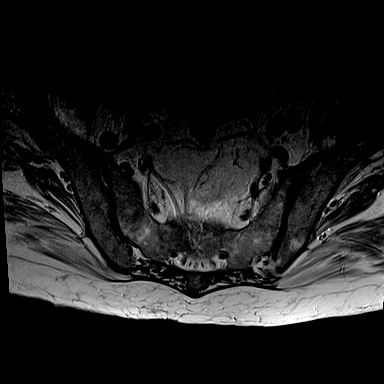
[im 4/34]
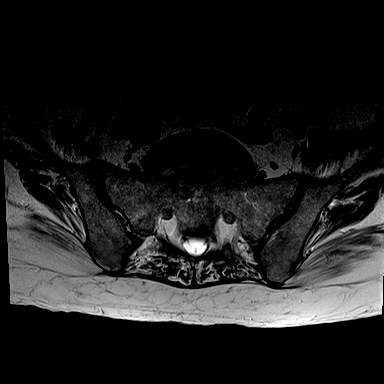
[im 12/34]
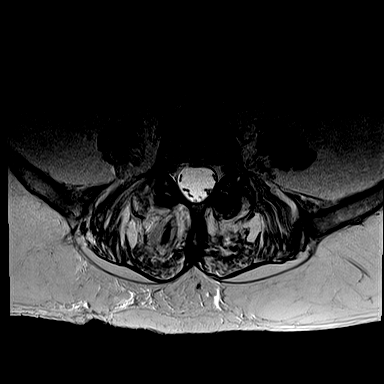
[im 15/34]
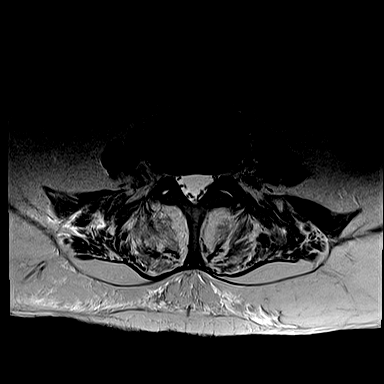
[im 19/34]
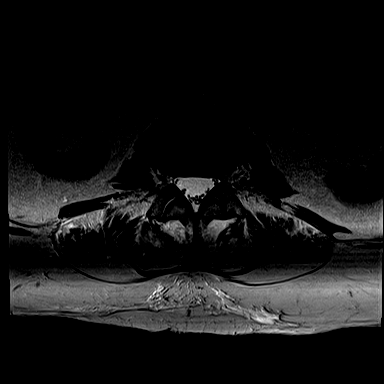
[im 23/34]
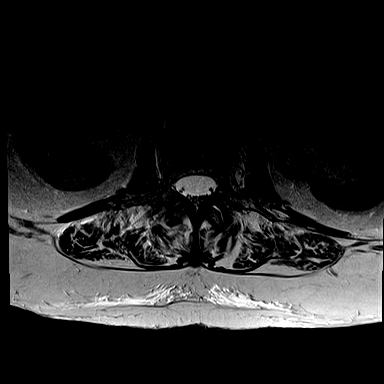
[im 30/34]
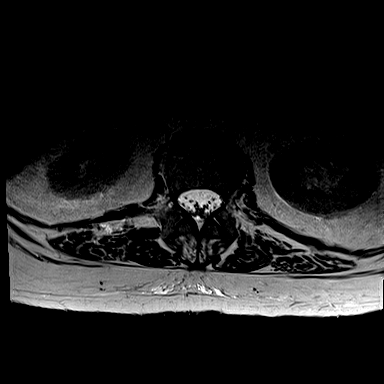
[im 34/34]
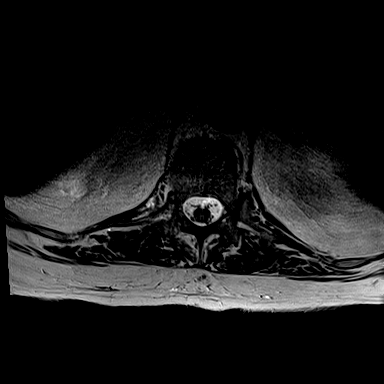

[Series 9: T1 · axial · 4.0mm · 0.34mm/px · z∈[-97,+101]mm · 5 of 34 slices shown (2 of 2)]
[im 1/34]
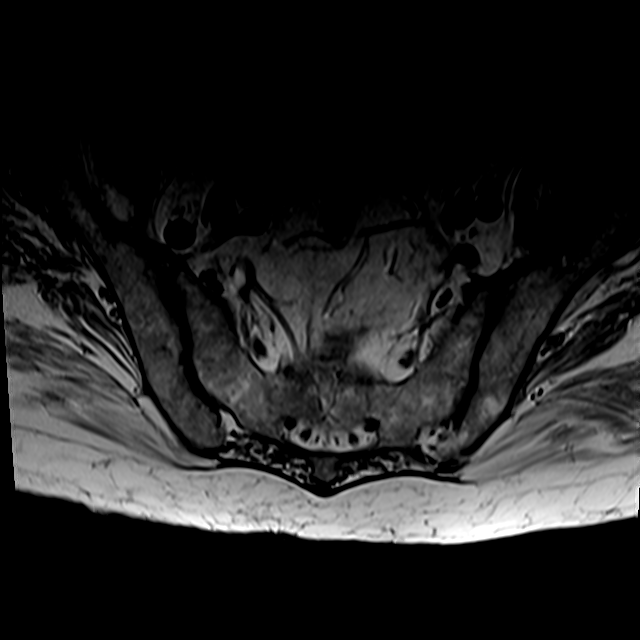
[im 4/34]
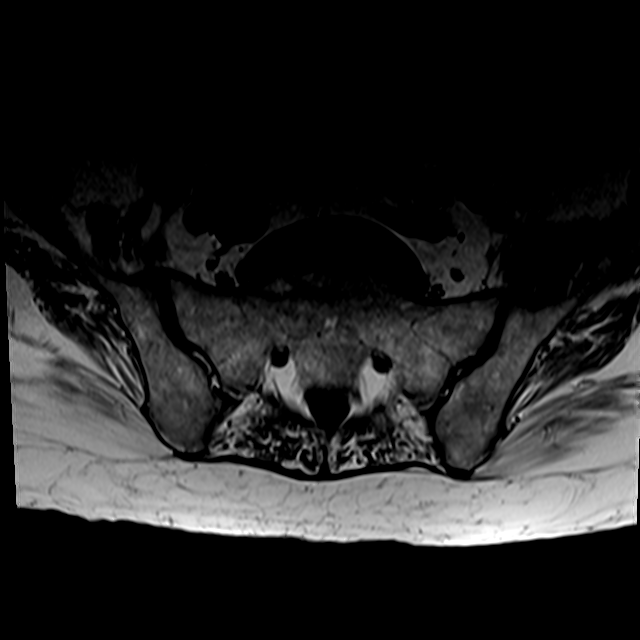
[im 12/34]
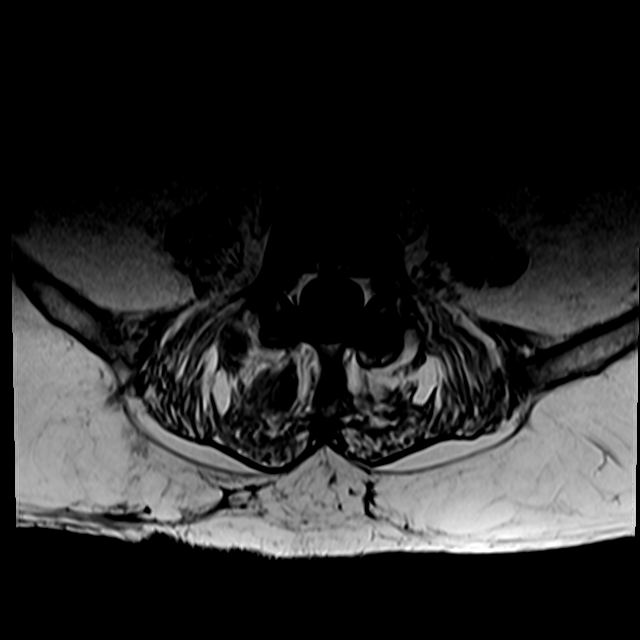
[im 19/34]
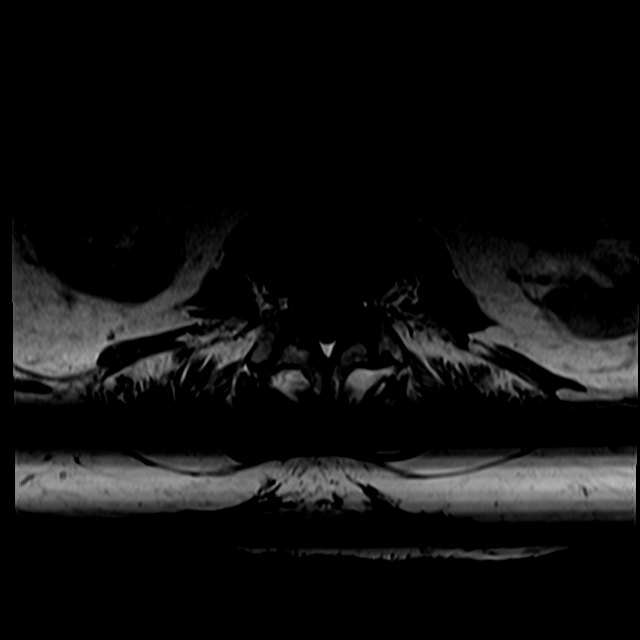
[im 30/34]
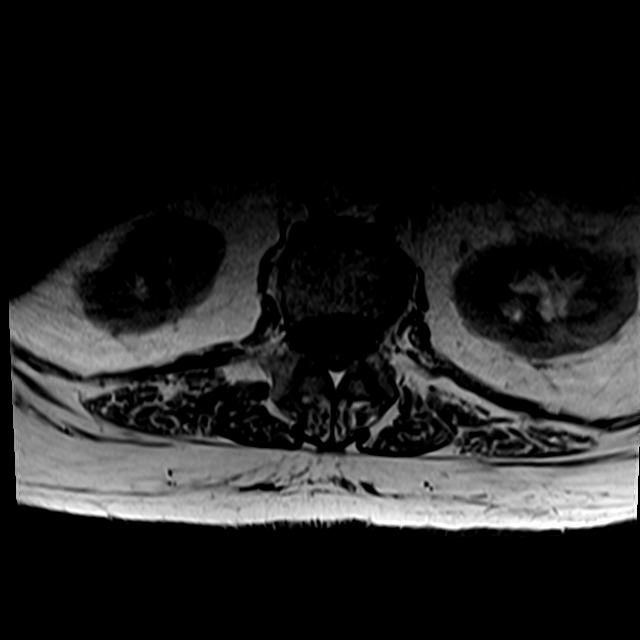

[22 of 48 positions shown; findings below may reference images not displayed]

FINDINGS: Segmentation: Standard. Lowest well-formed disc space labeled the
L5-S1 level.

Alignment: Chronic bilateral pars defects at L5 with associated 6 mm
spondylolisthesis of L5 on S1. Additional trace 2 mm anterolisthesis
of L4 on L5. 3 mm retrolisthesis of L1 on L2.

Vertebrae: Mild chronic anterior wedging deformity noted at the
superior endplate of T11. Vertebral body height otherwise maintained
without acute or subacute fracture. Underlying bone marrow signal
intensity within normal limits. No discrete or worrisome osseous
lesions. No abnormal marrow edema or enhancement. No findings to
suggest osteomyelitis discitis.

There is a prominent joint effusion involving the right L4-5 facet
(series 11, image 26). Prominent surrounding soft tissue edema and
enhancement. Constellation of findings highly suspicious for acute
septic arthritis with associated myositis/soft tissue infection.
Several T2 hyperintense irregular rim enhancing collections seen
within the posterior soft tissues immediately posterior to the right
L4-5 facet, consistent with abscess. Largest of these collections
measures 1.6 x 1.5 x 2.9 cm (series 10, image 3). An adjacent 1.9 cm
collection appears to communicate with the right L4-5 facet itself
(series 8, image 25). Small joint effusion with mild soft tissue
edema about the contralateral left L4-5 facet as well, which could
also be involved. Soft tissue signal intensity within the right
posterior and dorsal epidural space likely reflects mild phlegmon
(series 9, image 26). No frank epidural abscess at this time. Small
joint effusions noted within the bilateral L3-4 facets as well
without convincing evidence for septic arthritis.

Conus medullaris and cauda equina: Conus extends to the L1 level.
Conus and cauda equina appear normal.

Paraspinal and other soft tissues: No other acute abnormality within
the paraspinous soft tissues. Asymmetric atrophy noted within the
left psoas muscle. Multifocal atheromatous irregularity noted
throughout the visualized aorta. Visualized visceral structures
otherwise unremarkable.

Disc levels:

L1-2: Retrolisthesis. Diffuse disc bulge with disc desiccation and
intervertebral disc space narrowing. Disc bulging eccentric to the
left. Mild facet hypertrophy. Mild narrowing of the left lateral
recess without significant spinal stenosis. Mild left L1 foraminal
narrowing. Right neural foramen remains patent.

L2-3: Mild disc bulge with disc desiccation. Mild facet hypertrophy.
No significant spinal stenosis. Foramina remain patent.

L3-4: Mild disc bulge with disc desiccation. Mild to moderate
bilateral facet hypertrophy with associated trace joint effusions.
No significant spinal stenosis. Foramina remain patent.

L4-5: Trace anterolisthesis. Mild disc bulge with disc desiccation.
Moderate bilateral facet hypertrophy with associated joint
effusions. Findings of septic arthritis on the right. No significant
spinal stenosis. Mild bilateral L4 foraminal narrowing.

L5-S1: Chronic bilateral pars defects with associated 6 mm
spondylolisthesis. Degenerative intervertebral disc space narrowing
with disc desiccation and broad posterior pseudo disc bulge. Severe
bilateral facet arthrosis no significant spinal stenosis. Severe
right with moderate left L5 foraminal stenosis.
IMPRESSION: 1. Findings consistent with acute septic arthritis involving the
right L4-5 facet. Associated edema and enhancement within the
adjacent posterior paraspinous musculature consistent with
associated soft tissue infection/myositis. Superimposed multifocal
soft tissue abscesses measuring up to 2.9 cm as above. Trace
epidural phlegmon without frank epidural abscess at this time.
2. Chronic bilateral pars defects at L5 with associated 6 mm
spondylolisthesis, with resultant severe right and moderate left L5
foraminal stenosis.
3. Additional mild multilevel noncompressive disc bulging elsewhere
within the lumbar spine as above. No other high-grade stenosis or
evidence for neural impingement.

## 2020-02-14 MED ORDER — POTASSIUM CHLORIDE CRYS ER 20 MEQ PO TBCR
40.0000 meq | EXTENDED_RELEASE_TABLET | Freq: Two times a day (BID) | ORAL | Status: AC
  Administered 2020-02-14 (×2): 40 meq via ORAL
  Filled 2020-02-14 (×2): qty 2

## 2020-02-14 MED ORDER — ENSURE ENLIVE PO LIQD: 237.0000 mL | Freq: Three times a day (TID) | ORAL | Status: DC

## 2020-02-14 MED ORDER — BOOST / RESOURCE BREEZE PO LIQD CUSTOM
1.0000 | Freq: Three times a day (TID) | ORAL | Status: DC
  Administered 2020-02-14 – 2020-02-15 (×2): 1 via ORAL
  Filled 2020-02-14: qty 1

## 2020-02-14 MED ORDER — PROSOURCE PLUS PO LIQD
30.0000 mL | Freq: Three times a day (TID) | ORAL | Status: DC
  Administered 2020-02-14 – 2020-02-20 (×9): 30 mL via ORAL
  Filled 2020-02-14 (×13): qty 30

## 2020-02-14 MED ORDER — GADOBUTROL 1 MMOL/ML IV SOLN
7.5000 mL | Freq: Once | INTRAVENOUS | Status: AC | PRN
  Administered 2020-02-14: 7.5 mL via INTRAVENOUS

## 2020-02-14 MED ORDER — FUROSEMIDE 10 MG/ML IJ SOLN
40.0000 mg | Freq: Once | INTRAMUSCULAR | Status: AC
  Administered 2020-02-14: 40 mg via INTRAVENOUS
  Filled 2020-02-14: qty 4

## 2020-02-14 MED ORDER — MORPHINE SULFATE (PF) 2 MG/ML IV SOLN
2.0000 mg | INTRAVENOUS | Status: DC | PRN
  Administered 2020-02-14 – 2020-02-21 (×7): 2 mg via INTRAVENOUS
  Filled 2020-02-14 (×7): qty 1

## 2020-02-14 MED ORDER — TRAMADOL HCL 50 MG PO TABS
50.0000 mg | ORAL_TABLET | Freq: Once | ORAL | Status: AC | PRN
  Administered 2020-02-14: 50 mg via ORAL
  Filled 2020-02-14: qty 1

## 2020-02-14 MED ORDER — MAGNESIUM SULFATE IN D5W 1-5 GM/100ML-% IV SOLN
1.0000 g | Freq: Once | INTRAVENOUS | Status: AC
  Administered 2020-02-14: 1 g via INTRAVENOUS
  Filled 2020-02-14: qty 100

## 2020-02-14 NOTE — Progress Notes (Signed)
eLink Physician-Brief Progress Note Patient Name: Javier Beard DOB: [birth year] MRN: [id number]   Date of Service  02/14/2020  HPI/Events of Note  Notified of back and shoulder pain not relieved with Tylenol. Already on Prednisone for RA  Creatinine now 0.70 but came in with AKI  eICU Interventions  Ordered a one time dose of Tramadol  If pain persistent, NSAID may be an option if kidney function stable     Intervention Category Minor Interventions: Routine modifications to care plan (e.g. PRN medications for pain, fever)  Javier Beard Javier Beard 02/14/2020, 12:26 AM

## 2020-02-14 NOTE — Progress Notes (Signed)
eLink Physician-Brief Progress Note Patient Name: Javier Beard DOB: [birth year] MRN: [id number]   Date of Service  02/14/2020  HPI/Events of Note  Notified that tramadol did not work. Patient would have episodes of agitation and complaining of pain. Currently seen asleep.  eICU Interventions  Ordered morphine prn     Intervention Category Intermediate Interventions: Pain - evaluation and management Minor Interventions: Routine modifications to care plan (e.g. PRN medications for pain, fever)  [name] Jilliane Kazanjian 02/14/2020, 3:45 AM

## 2020-02-14 NOTE — Plan of Care (Signed)
  Problem: Education: Goal: Knowledge of risk factors and measures for prevention of condition will improve Outcome: Progressing   Problem: Respiratory: Goal: Will maintain a patent airway Outcome: Progressing Goal: Complications related to the disease process, condition or treatment will be avoided or minimized Outcome: Progressing   Problem: Education: Goal: Knowledge of General Education information will improve Description: Including pain rating scale, medication(s)/side effects and non-pharmacologic comfort measures Outcome: Progressing   Problem: Health Behavior/Discharge Planning: Goal: Ability to manage health-related needs will improve Outcome: Progressing   Problem: Clinical Measurements: Goal: Ability to maintain clinical measurements within normal limits will improve Outcome: Progressing Goal: Will remain free from infection Outcome: Progressing Goal: Diagnostic test results will improve Outcome: Progressing Goal: Respiratory complications will improve Outcome: Progressing Goal: Cardiovascular complication will be avoided Outcome: Progressing   Problem: Activity: Goal: Risk for activity intolerance will decrease Outcome: Progressing   Problem: Coping: Goal: Level of anxiety will decrease Outcome: Progressing

## 2020-02-14 NOTE — Progress Notes (Signed)
Cortrak Tube Team Note:  Consult received to place a Cortrak feeding tube.   Arrived to room. Per RN patient is reluctant to have feeding tube placed. After discussing importance of tube patient allowed RD to attempt placement of Cortrak tube. Advanced to approximately 50 cm and patient became agitated and requested tube be removed. Attempts at calming patient were unsuccessful. RD removed Cortrak tube. Patient refusing further attempts at placing Cortrak tube. RD left tube and bridle in room in case patient agreeable to attempt tube placement again on another day. Notified RN.   Felix Pacini, MS, RD, LDN Pager number available on Amion

## 2020-02-14 NOTE — Progress Notes (Signed)
Physical Therapy Treatment Patient Details Name: Javier Beard MRN: [id number] DOB: August 23, 1940 Today's Date: 02/14/2020    History of Present Illness Pt is a 80 y.o. male admitted 02/17/2020 with worsening SOB, nausea/vomitting. Workup for acute hypoxic respiratory failure due to COVID-19 PNA. CTA 1/14 negative for PE. Pt with worsening respiratory status and AMS early AM 1/21; head CT negative for acute abnormality. PMH includes RA, DM2, afib, HF.    PT Comments    Pt continues to do very little mobility for me when I come by.  He could not tell me if he was lifted or stood to get to the chair this morning and was only agreeable to seated exercises, incentive spirometer practice.  He is very weak and gets weaker the longer he is here.  He did not want to attempt standing, but I did get a RW in his room in the ICU so that we will be ready to practice next session.  He needs strong encouragement to mobilize more.  PT will continue to follow acutely for safe mobility progression.   Follow Up Recommendations  SNF     Equipment Recommendations  Rolling walker with 5" wheels    Recommendations for Other Services       Precautions / Restrictions Precautions Precautions: Fall;Other (comment) Precaution Comments: watch SpO2    Mobility  Bed Mobility               General bed mobility comments: Pt was OOB in the recliner chair.  Transfers                 General transfer comment: Pt refused to attempt standing, "Why don't you let me rest and come back later this morning" I re-oriented pt that it was alreay the afternoon.  Ambulation/Gait                 Stairs             Wheelchair Mobility    Modified Rankin (Stroke Patients Only)       Balance                                            Cognition Arousal/Alertness: Lethargic (PT woke pt up from sleeping) Behavior During Therapy: Flat affect Overall Cognitive Status:  Impaired/Different from baseline Area of Impairment: Memory                     Memory: Decreased short-term memory         General Comments: Asked pt how he got over to the chair this morning, did he stand or use the lift, "I don't know!"  per pt report.      Exercises General Exercises - Upper Extremity Shoulder Flexion: AAROM;Both;10 reps Elbow Flexion: AAROM;Both;10 reps General Exercises - Lower Extremity Ankle Circles/Pumps: AROM;Both;10 reps Long Arc Quad: AROM;Both;10 reps Hip Flexion/Marching: AAROM;Both;10 reps;Seated Other Exercises Other Exercises: Incentive spirometer x 5 reps, max inspired volume was    General Comments General comments (skin integrity, edema, etc.): O2 sats >90% on 12 L HFNC during seated HEP.  DOE 2/4 and HR in the 110s.      Pertinent Vitals/Pain Pain Assessment: Faces Faces Pain Scale: Hurts even more Pain Location: back Pain Descriptors / Indicators: Grimacing;Guarding Pain Intervention(s): Limited activity within patient's tolerance;Monitored during session;Repositioned    Home Living  Prior Function            PT Goals (current goals can now be found in the care plan section) Progress towards PT goals: Not progressing toward goals - comment (pt is getting weaker)    Frequency    Min 2X/week      PT Plan Frequency needs to be updated    Co-evaluation              AM-PAC PT "6 Clicks" Mobility   Outcome Measure  Help needed turning from your back to your side while in a flat bed without using bedrails?: A Lot Help needed moving from lying on your back to sitting on the side of a flat bed without using bedrails?: A Lot Help needed moving to and from a bed to a chair (including a wheelchair)?: Total Help needed standing up from a chair using your arms (e.g., wheelchair or bedside chair)?: Total Help needed to walk in hospital room?: Total Help needed climbing 3-5 steps  with a railing? : Total 6 Click Score: 8    End of Session Equipment Utilized During Treatment: Oxygen Activity Tolerance: Patient limited by fatigue (self limiting) Patient left: in chair;with call bell/phone within reach;with nursing/sitter in room Nurse Communication: Mobility status PT Visit Diagnosis: Unsteadiness on feet (R26.81);Other abnormalities of gait and mobility (R26.89)     Time: 1247-1310 PT Time Calculation (min) (ACUTE ONLY): 23 min  Charges:  $Therapeutic Exercise: 23-37 mins                     Corinna Capra, PT, DPT  Acute Rehabilitation [phone] pager [phone]) [phone] office

## 2020-02-14 NOTE — Progress Notes (Signed)
Initial Nutrition Assessment  DOCUMENTATION CODES:   Not applicable  INTERVENTION:   Liberalize diet to REGULAR  D/C Ensure Enlive  Add Boost Breeze po TID, each supplement provides 250 kcal and 9 grams of protein  30 ml ProSource Plus TID, each supplement provides 100 kcals and 15 grams protein.   Recommend Cortrak insertion given prolonged poor po intake with increased nutrient needs. Discussed with MD and MD agreeable.  ADDENDUM: Cortrak tube attempted but after intial attempt pt refused placement. Please see Cortrak Tube Team note for more info  Tube Feeding Recommendations via Cortrak if able: -Vital 1.5 at 60 ml/hr -Pro-Source TF 45 mL BID TF regimen provides 119 g of protien, 2240 kcals and 1094 ml of free water  NUTRITION DIAGNOSIS:   Inadequate oral intake related to decreased appetite,acute illness as evidenced by meal completion < 25%.  GOAL:   Patient will meet greater than or equal to 90% of their needs  MONITOR:   PO intake,TF tolerance,Labs,Weight trends  REASON FOR ASSESSMENT:   LOS    ASSESSMENT:   80 yo male admitted with acute respiratory failure related to COVID pneumonia and developed MSSA pneumonia/bacteremia, septic shock, AKI. PMH includes RA on immunosuppressants including steroids, DM  1/08 Admitted 1/21 Transferred to ICU with septic shock due to pneumonia requiring pressors  LOS day 19  Pt very deconditioned; currently on 12L HFNC  Recorded po intake 0-25% of meals, mostly 10% of meals or less; pt ate 0% at breakfast this AM.  Pt does not like the Ensure Enlive but is drinking water, tea and juice. Plan to trial Boost Breeze supplement as it is juice-like.   Noted pt with DTI to sacrum  Admit weight 81.6 kg; current wt 79.6 kg. Noted weight of 72.6 kg on 1/15. Pt is net negative 2.3 L per iI/O flow sheet; noted moderate edema in all extremities on exam. Unsure of actual dry weight   Labs: sodium 133 (L) Meds: folic acid, ss  novolog, colace, miralax, KCl, prednisone, B-12, zinc sulfate, Vit C, calcium carbonate   Diet Order:   Diet Order            Diet regular Room service appropriate? Yes; Fluid consistency: Thin  Diet effective now           Diet - low sodium heart healthy                 EDUCATION NEEDS:   Not appropriate for education at this time  Skin:  Skin Assessment: Skin Integrity Issues: Skin Integrity Issues:: DTI DTI: sacrum  Last BM:  1/21  Height:   Ht Readings from Last 1 Encounters:  01/23/2020 5\' 6"  (1.676 m)    Weight:   Wt Readings from Last 1 Encounters:  02/12/20 79.6 kg    BMI:  Body mass index is 28.32 kg/m.  Estimated Nutritional Needs:   Kcal:  2150-2550 kcals  Protein:  110-125 g  Fluid:  >/= 2 L   08-17-1989 MS, RDN, LDN, CNSC Registered Dietitian III Clinical Nutrition RD Pager and On-Call Pager Number Located in [place]

## 2020-02-14 NOTE — Progress Notes (Addendum)
[facility] Progress Note  Patient: Javier Beard    BJS:[id number]  DOA: 2020/01/31     Date of Service: the patient was seen and examined on 02/14/2020  Brief hospital course: Possible due to history of rheumatoid arthritis, type II DM, A. fib, chronic HFpEF.  Presents with complaints of cough and shortness of breath.  Found to have COVID-19 pneumonia with hypoxia  Currently plan is continue supportive care for hypoxia.  Assessment and Plan: 1. Acute Hypoxic respiratory failure, POA, 92 % on room air on admission.  Acute COVID-19 Viral Pneumonia CXR: hazy bilateral peripheral opacities CTA chest PE protocol 1/14; negative for PE Oxygen requirement: At rest 12 LPM.  CRP: 13.8, trending down from 28 Remdesivir: Completed on 1/11 Steroids: On prednisone taper Baricitinib/Actemra: Not initiated,not indicated. Antibiotics: Initiated on 02/09/2020 DVT Prophylaxis:  apixaban (ELIQUIS) tablet 5 mg  Prone positioning and incentive spirometer use recommended.  Overall plan: Continue supportive measures for COVID-19 pneumonia and monitor for complications  The treatment plan and use of medications and known side effects were discussed with patient/family. It was clearly explained that complete risks and long-term side effects are unknown. Patient/family agree with the treatment plan.    2.    Septic shock, not POA with MSSA bacteremia and MSSA pneumonia. Patient's oxygen condition worsen on 1/21 during transfer to ICU. Blood cultures came back positive for MSSA. Started on IV cefazolin. Currently ID following. Echocardiogram negative for vegetation. Recommend TEE when appropriate from cardiology perspective. MRI C-spine T-spine negative for acute infection. ID recommended MRI L-spine. 4 weeks of IV antibiotics recommended. IV cefazolin changed to IV vancomycin in the setting of thrombocytopenia.   3. Rheumatoid arthritis Currently holding all medication. Continue to hold  methotrexate and Simponi until he has finished his antibiotics. Continue to hold Plaquenil for at least 2 weeks. Continue prednisone.  4.  Type 2 diabetes mellitus uncontrolled with hyperglycemia and hypoglycemia without any complication Currently on sliding scale insulin, regimen adjusted Back on IV Solu-Medrol. Discontinue glipizide.  5.  Permanent A. fib On anticoagulation with Eliquis.  Rate controlled.  Without any medication.  6.  Chronic HFpEF Patient was on 60 mg of oral Lasix. Echocardiogram negative for any acute abnormality per Echocardiogram with bubble study also negative for any intra-chamber shunt. Diuretics on hold for now.  7. Acute kidney injury/hyponatremia Likely due to septic shock/hypovolemia/ATN.  Improved.  Monitor sodium levels.  8. Normocytic anemia No evidence of overt bleeding.  Drop in hemoglobin as noted.  Possibly due to dilution.  Continue to trend.  9. Thrombocytopenia Platelet count noted to be slowly trending downwards.  Possibly due to sepsis.  Patient however is on apixaban. Medications reviewed.  For now we will hold his hydroxychloroquine. Peripheral smear negative for any schistocytes or acute abnormality. Cefazolin can also cause thrombocytopenia.  Currently on vancomycin  10. Toxic metabolic encephalopathy In the setting of sepsis, renal failure, hypoxemia ICU stay.  Improving.  11.  Poor p.o. intake. Dietary recommended cortrak.  PCCM ordered 1. Unable to insert it without agitation and therefore patient currently refusing it. Monitor. Body mass index is 28.32 kg/m.   Malnutrition Type: Nutrition Problem: Inadequate oral intake Etiology: decreased appetite,acute illness Nutrition Interventions: Interventions: Tube feeding,Liberalize Diet   12.  Medial sacral DTI. POA Continue foam dressing  Pressure Injury 02/09/20 Sacrum Medial Deep Tissue Pressure Injury - Purple or maroon localized area of discolored intact skin or  blood-filled blister due to damage of underlying soft tissue from pressure and/or  shear. (Active)  02/09/20 0900  Location: Sacrum  Location Orientation: Medial  Staging: Deep Tissue Pressure Injury - Purple or maroon localized area of discolored intact skin or blood-filled blister due to damage of underlying soft tissue from pressure and/or shear.  Wound Description (Comments):   Present on Admission: Yes     Diet: Cardiac diet DVT Prophylaxis:    apixaban (ELIQUIS) tablet 5 mg    Advance goals of care discussion: Full code  Family Communication: no family was present at bedside, at the time of interview.  Discussed on the phone with daughter on 1/26  Disposition:  Status is: Inpatient  Remains inpatient appropriate because:Inpatient level of care appropriate due to severity of illness   Dispo: The patient is from: Home              Anticipated d/c is to: Home PT recommends SNF although patient wants to go home.              Anticipated d/c date is: 3 days              Patient currently is not medically stable to d/c.  Subjective: Still has shortness of breath and vomiting Holroyd feeding tube  Physical Exam:  General: Appear in mild distress, no Rash; Oral Mucosa Clear, moist. no Abnormal Neck Mass Or lumps, Conjunctiva normal  Cardiovascular: S1 and S2 Present, no Murmur, Respiratory: increased respiratory effort, Bilateral Air entry present and bilateral  Crackles, no wheezes Abdomen: Bowel Sound present, Soft and no tenderness Extremities: trace Pedal edema Neurology: alert and oriented to time, place, and person affect appropriate. no new focal deficit Gait not checked due to patient safety concerns    Vitals:   02/14/20 1600 02/14/20 1700 02/14/20 1800 02/14/20 1900  BP: 108/67 108/65 (!) 107/55 112/68  Pulse: 78 92 83 84  Resp: (!) 22 (!) 26 (!) 21 (!) 26  Temp: (!) 96.8 F (36 C)     TempSrc: Axillary     SpO2: 100% 96% 100% 100%  Weight:      Height:         Intake/Output Summary (Last 24 hours) at 02/14/2020 1933 Last data filed at 02/14/2020 1800 Gross per 24 hour  Intake 786.73 ml  Output 1550 ml  Net -763.27 ml   Filed Weights   02/03/20 0410 02/09/20 0915 02/12/20 0412  Weight: 72.6 kg 77.5 kg 79.6 kg    Data Reviewed: I have personally reviewed and interpreted daily labs, tele strips, imaging. I reviewed all nursing notes, pharmacy notes, vitals, pertinent old records I have discussed plan of care as described above with RN and patient/family.  CBC: Recent Labs  Lab 02/10/20 0308 02/11/20 0209 02/12/20 0418 02/13/20 1310 02/14/20 0307  WBC 24.3* 15.6* 9.8 8.5 8.8  HGB 11.6* 9.5* 9.7* 11.0* 10.9*  HCT 34.9* 27.8* 30.3* 33.1* 32.1*  MCV 97.5 93.0 95.3 92.7 92.8  PLT PLATELET CLUMPS NOTED ON SMEAR, UNABLE TO ESTIMATE 83* 63* 64* 64*   Basic Metabolic Panel: Recent Labs  Lab 02/10/20 0308 02/11/20 0209 02/12/20 0418 02/13/20 0843 02/14/20 0307  NA 135 132* 130* 131* 133*  K 5.2* 3.8 3.8 4.0 3.6  CL 106 105 104 104 105  CO2 15* 19* 18* 18* 18*  GLUCOSE 208* 102* 127* 58* 85  BUN 31* 21 18 18 18   CREATININE 1.32* 0.77 0.69 0.70 0.70  CALCIUM 7.5* 7.4* 7.5* 7.5* 7.6*  MG 2.1 2.1 2.1 2.0 2.0  PHOS 4.0  --   --   --   --  Studies: No results found.  Scheduled Meds: . (feeding supplement) PROSource Plus  30 mL Oral TID WC  . apixaban  5 mg Oral BID  . vitamin C  500 mg Oral Daily  . calcium carbonate  1,250 mg Oral Q breakfast  . calcium carbonate  0.5 tablet Oral Q supper  . chlorhexidine  15 mL Mouth Rinse BID  . Chlorhexidine Gluconate Cloth  6 each Topical Daily  . docusate sodium  100 mg Oral BID  . feeding supplement  1 Container Oral TID BM  . folic acid  1 mg Oral Daily  . insulin aspart  0-9 Units Subcutaneous TID WC  . mouth rinse  15 mL Mouth Rinse BID  . pantoprazole  40 mg Oral Daily  . polyethylene glycol  17 g Oral Daily  . potassium chloride  40 mEq Oral BID  . pravastatin  80 mg  Oral q1800  . predniSONE  20 mg Oral Q breakfast   Followed by  . [START ON 02/18/2020] predniSONE  10 mg Oral Q breakfast   Followed by  . [START ON 02/23/2020] predniSONE  5 mg Oral Q breakfast  . vitamin B-12  1,000 mcg Oral Daily  . zinc sulfate  220 mg Oral Daily   Continuous Infusions: . sodium chloride Stopped (02/14/20 1725)  . vancomycin 200 mL/hr at 02/14/20 1800   PRN Meds: acetaminophen **OR** acetaminophen, morphine injection, senna-docusate  Time spent: 35 minutes  Author: Lynden Oxford, MD [facility] 02/14/2020 7:33 PM  To reach On-call, see care teams to locate the attending and reach out via [email or web address]. Between 7PM-7AM, please contact night-coverage If you still have difficulty reaching the attending provider, please page the [facility] (Director on Call) for [facility] on amion for assistance.

## 2020-02-14 NOTE — Progress Notes (Signed)
Subjective:  Is now complaining of bilateral hip pain along with his neck pain.  On exam he cannot lift either leg against gravity  Antibiotics:  Anti-infectives (From admission, onward)   Start     Dose/Rate Route Frequency Ordered Stop   02/14/20 0600  vancomycin (VANCOCIN) IVPB 1000 mg/200 mL premix        1,000 mg 200 mL/hr over 60 Minutes Intravenous Every 12 hours 02/13/20 1348     02/13/20 1600  vancomycin (VANCOREADY) IVPB 1750 mg/350 mL        1,750 mg 175 mL/hr over 120 Minutes Intravenous  Once 02/13/20 1348 02/13/20 1924   02/09/20 2300  ceFAZolin (ANCEF) IVPB 2g/100 mL premix  Status:  Discontinued        2 g 200 mL/hr over 30 Minutes Intravenous Every 8 hours 02/09/20 2233 02/13/20 1349   02/09/20 1000  ceFEPIme (MAXIPIME) 1 g in sodium chloride 0.9 % 100 mL IVPB  Status:  Discontinued        1 g 200 mL/hr over 30 Minutes Intravenous Every 12 hours 02/09/20 0846 02/09/20 2233   02/09/20 1000  linezolid (ZYVOX) IVPB 600 mg  Status:  Discontinued        600 mg 300 mL/hr over 60 Minutes Intravenous Every 12 hours 02/09/20 0846 02/09/20 2233   01/27/20 1000  remdesivir 100 mg in sodium chloride 0.9 % 100 mL IVPB        100 mg 200 mL/hr over 30 Minutes Intravenous Daily 2020-02-14 1602 01/30/20 1418   01/27/20 1000  hydroxychloroquine (PLAQUENIL) tablet 200 mg  Status:  Discontinued        200 mg Oral 2 times daily 01/27/20 0358 02/13/20 1155   02/14/20 1630  remdesivir 100 mg in sodium chloride 0.9 % 100 mL IVPB        100 mg 200 mL/hr over 30 Minutes Intravenous Every 30 min 14-Feb-2020 1602 2020-02-14 1745      Medications: Scheduled Meds: . (feeding supplement) PROSource Plus  30 mL Oral TID WC  . apixaban  5 mg Oral BID  . vitamin C  500 mg Oral Daily  . calcium carbonate  1,250 mg Oral Q breakfast  . calcium carbonate  0.5 tablet Oral Q supper  . chlorhexidine  15 mL Mouth Rinse BID  . Chlorhexidine Gluconate Cloth  6 each Topical Daily  . docusate  sodium  100 mg Oral BID  . feeding supplement  1 Container Oral TID BM  . folic acid  1 mg Oral Daily  . insulin aspart  0-9 Units Subcutaneous TID WC  . mouth rinse  15 mL Mouth Rinse BID  . pantoprazole  40 mg Oral Daily  . polyethylene glycol  17 g Oral Daily  . potassium chloride  40 mEq Oral BID  . pravastatin  80 mg Oral q1800  . predniSONE  20 mg Oral Q breakfast   Followed by  . [START ON 02/18/2020] predniSONE  10 mg Oral Q breakfast   Followed by  . [START ON 02/23/2020] predniSONE  5 mg Oral Q breakfast  . vitamin B-12  1,000 mcg Oral Daily  . zinc sulfate  220 mg Oral Daily   Continuous Infusions: . sodium chloride Stopped (02/13/20 1724)  . vancomycin 1,000 mg (02/14/20 0521)   PRN Meds:.acetaminophen **OR** acetaminophen, morphine injection, senna-docusate    Objective: Weight change:   Intake/Output Summary (Last 24 hours) at 02/14/2020 1655 Last data filed at 02/14/2020  1000 Gross per 24 hour  Intake 817.69 ml  Output 1250 ml  Net -432.31 ml   Blood pressure 108/67, pulse 78, temperature (!) 96.8 F (36 C), temperature source Axillary, resp. rate (!) 22, height  (1.676 m), weight 79.6 kg, SpO2 100 %. Temp:  [96.8 F (36 C)-98.2 F (36.8 C)] 96.8 F (36 C) (01/26 1600) Pulse Rate:  [74-98] 78 (01/26 1600) Resp:  [22-36] 22 (01/26 1600) BP: (103-150)/(60-128) 108/67 (01/26 1600) SpO2:  [93 %-100 %] 100 % (01/26 1600)  Physical Exam: Physical Exam HENT:     Head: Normocephalic and atraumatic.  Eyes:     Extraocular Movements: Extraocular movements intact.  Cardiovascular:     Rate and Rhythm: Rhythm irregular.     Heart sounds: Murmur heard.    Pulmonary:     Effort: No respiratory distress.     Breath sounds: Rhonchi present.  Abdominal:     General: Bowel sounds are normal. There is no distension.  Musculoskeletal:        General: Normal range of motion.       Feet:  Feet:     Right foot:     Skin integrity: Callus present. No ulcer  or blister.     Left foot:     Skin integrity: No ulcer, blister, skin breakdown or erythema.  Skin:    General: Skin is warm.  Neurological:     Mental Status: He is alert.     Motor: Weakness present.     Comments: He cannot lift of either leg against gravity       CBC:    BMET Recent Labs    02/13/20 0843 02/14/20 0307  NA 131* 133*  K 4.0 3.6  CL 104 105  CO2 18* 18*  GLUCOSE 58* 85  BUN 18 18  CREATININE 0.70 0.70  CALCIUM 7.5* 7.6*     Liver Panel  No results for input(s): PROT, ALBUMIN, AST, ALT, ALKPHOS, BILITOT, BILIDIR, IBILI in the last 72 hours.     Sedimentation Rate No results for input(s): ESRSEDRATE in the last 72 hours. C-Reactive Protein Recent Labs    02/13/20 0843 02/14/20 0307  CRP 10.4* 13.8*    Micro Results: Recent Results (from the past 720 hour(s))  Resp Panel by RT-PCR (Flu A&B, Covid) Nasopharyngeal Swab     Status: Abnormal   Collection Time: 02/16/2020  1:42 PM   Specimen: Nasopharyngeal Swab; Nasopharyngeal(NP) swabs in vial transport medium  Result Value Ref Range Status   SARS Coronavirus 2 by RT PCR POSITIVE (A) NEGATIVE Final    Comment: RESULT CALLED TO, READ BACK BY AND VERIFIED WITH: SIMMS.M,RN @ 1547 ON 01/28/2020, [email or web address].P (NOTE) SARS-CoV-2 target nucleic acids are DETECTED.  The SARS-CoV-2 RNA is generally detectable in upper respiratory specimens during the acute phase of infection. Positive results are indicative of the presence of the identified virus, but do not rule out bacterial infection or co-infection with other pathogens not detected by the test. Clinical correlation with patient history and other diagnostic information is necessary to determine patient infection status. The expected result is Negative.  Fact Sheet for Patients: BloggerCourse.com  Fact Sheet for Healthcare Providers: SeriousBroker.it  This test is not yet approved or cleared  by the [place] FDA and  has been authorized for detection and/or diagnosis of SARS-CoV-2 by FDA under an Emergency Use Authorization (EUA).  This EUA will remain in effect (meaning this  test can be used) for the duration of  the COVID-19 declaration under Section 564(b)(1) of the Act, 21 U.S.C. section 360bbb-3(b)(1), unless the authorization is terminated or revoked sooner.     Influenza A by PCR NEGATIVE NEGATIVE Final   Influenza B by PCR NEGATIVE NEGATIVE Final    Comment: (NOTE) The Xpert Xpress SARS-CoV-2/FLU/RSV plus assay is intended as an aid in the diagnosis of influenza from Nasopharyngeal swab specimens and should not be used as a sole basis for treatment. Nasal washings and aspirates are unacceptable for Xpert Xpress SARS-CoV-2/FLU/RSV testing.  Fact Sheet for Patients: BloggerCourse.com  Fact Sheet for Healthcare Providers: SeriousBroker.it  This test is not yet approved or cleared by the [place] FDA and has been authorized for detection and/or diagnosis of SARS-CoV-2 by FDA under an Emergency Use Authorization (EUA). This EUA will remain in effect (meaning this test can be used) for the duration of the COVID-19 declaration under Section 564(b)(1) of the Act, 21 U.S.C. section 360bbb-3(b)(1), unless the authorization is terminated or revoked.  Performed at [facility], [address] [place], [place] [address]   Blood Culture (routine x 2)     Status: None   Collection Time: 2020/02/03  4:11 PM   Specimen: BLOOD LEFT FOREARM  Result Value Ref Range Status   Specimen Description   Final    BLOOD LEFT FOREARM Performed at [facility], [address], [place], [place] [address]    Special Requests   Final    BOTTLES DRAWN AEROBIC AND ANAEROBIC Blood Culture adequate volume Performed at [facility], [address] [place], [place] [address]    Culture   Final    NO  GROWTH 5 DAYS Performed at [facility], [address] [place], [place] [address]    Report Status 01/31/2020 FINAL  Final  Blood Culture (routine x 2)     Status: None   Collection Time: 03-Feb-2020  4:45 PM   Specimen: BLOOD RIGHT HAND  Result Value Ref Range Status   Specimen Description   Final    BLOOD RIGHT HAND Performed at [facility], [address], [place], [place] [address]    Special Requests   Final    BOTTLES DRAWN AEROBIC AND ANAEROBIC Blood Culture adequate volume Performed at [facility], [address] [place], [place] [address]    Culture   Final    NO GROWTH 5 DAYS Performed at [facility], [address] [place], [place] [address]    Report Status 01/31/2020 FINAL  Final  Culture, blood (Routine X 2) w Reflex to ID Panel     Status: Abnormal   Collection Time: 02/09/20  8:10 AM   Specimen: BLOOD LEFT HAND  Result Value Ref Range Status   Specimen Description BLOOD LEFT HAND  Final   Special Requests   Final    BOTTLES DRAWN AEROBIC ONLY Blood Culture results may not be optimal due to an inadequate volume of blood received in culture bottles   Culture  Setup Time   Final    GRAM POSITIVE COCCI IN CLUSTERS AEROBIC BOTTLE ONLY CRITICAL VALUE NOTED.  VALUE IS CONSISTENT WITH PREVIOUSLY REPORTED AND CALLED VALUE.    Culture (A)  Final    STAPHYLOCOCCUS AUREUS SUSCEPTIBILITIES PERFORMED ON PREVIOUS CULTURE WITHIN THE LAST 5 DAYS. Performed at [facility], [address], [place], [place] [address]    Report Status 02/11/2020 FINAL  Final  Culture, blood (  Routine X 2) w Reflex to ID Panel     Status: Abnormal   Collection Time: 02/09/20  8:22 AM   Specimen: BLOOD RIGHT HAND  Result Value Ref Range Status   Specimen Description BLOOD RIGHT HAND  Final   Special Requests   Final    BOTTLES DRAWN AEROBIC AND ANAEROBIC Blood Culture results may not be optimal due to an inadequate volume of blood received in culture  bottles   Culture  Setup Time   Final    GRAM POSITIVE COCCI IN CLUSTERS IN BOTH AEROBIC AND ANAEROBIC BOTTLES Organism ID to follow CRITICAL RESULT CALLED TO, READ BACK BY AND VERIFIED WITH: C AMEND [facility] 02/09/20 2216 JDW Performed at [facility], [address], [place], [place] [address]    Culture STAPHYLOCOCCUS AUREUS (A)  Final   Report Status 02/11/2020 FINAL  Final   Organism ID, Bacteria STAPHYLOCOCCUS AUREUS  Final      Susceptibility   Staphylococcus aureus - MIC*    CIPROFLOXACIN <=0.5 SENSITIVE Sensitive     ERYTHROMYCIN <=0.25 SENSITIVE Sensitive     GENTAMICIN <=0.5 SENSITIVE Sensitive     OXACILLIN 0.5 SENSITIVE Sensitive     TETRACYCLINE <=1 SENSITIVE Sensitive     VANCOMYCIN 1 SENSITIVE Sensitive     TRIMETH/SULFA <=10 SENSITIVE Sensitive     CLINDAMYCIN <=0.25 SENSITIVE Sensitive     RIFAMPIN <=0.5 SENSITIVE Sensitive     Inducible Clindamycin NEGATIVE Sensitive     * STAPHYLOCOCCUS AUREUS  Blood Culture ID Panel (Reflexed)     Status: Abnormal   Collection Time: 02/09/20  8:22 AM  Result Value Ref Range Status   Enterococcus faecalis NOT DETECTED NOT DETECTED Final   Enterococcus Faecium NOT DETECTED NOT DETECTED Final   Listeria monocytogenes NOT DETECTED NOT DETECTED Final   Staphylococcus species DETECTED (A) NOT DETECTED Final    Comment: CRITICAL RESULT CALLED TO, READ BACK BY AND VERIFIED WITH: C AMEND PHARMD 02/09/20 2216 JDW    Staphylococcus aureus (BCID) DETECTED (A) NOT DETECTED Final    Comment: CRITICAL RESULT CALLED TO, READ BACK BY AND VERIFIED WITH: C AMEND PHARMD 02/09/20 2216 JDW    Staphylococcus epidermidis NOT DETECTED NOT DETECTED Final   Staphylococcus lugdunensis NOT DETECTED NOT DETECTED Final   Streptococcus species NOT DETECTED NOT DETECTED Final   Streptococcus agalactiae NOT DETECTED NOT DETECTED Final   Streptococcus pneumoniae NOT DETECTED NOT DETECTED Final   Streptococcus pyogenes NOT DETECTED NOT DETECTED Final    A.calcoaceticus-baumannii NOT DETECTED NOT DETECTED Final   Bacteroides fragilis NOT DETECTED NOT DETECTED Final   Enterobacterales NOT DETECTED NOT DETECTED Final   Enterobacter cloacae complex NOT DETECTED NOT DETECTED Final   Escherichia coli NOT DETECTED NOT DETECTED Final   Klebsiella aerogenes NOT DETECTED NOT DETECTED Final   Klebsiella oxytoca NOT DETECTED NOT DETECTED Final   Klebsiella pneumoniae NOT DETECTED NOT DETECTED Final   Proteus species NOT DETECTED NOT DETECTED Final   Salmonella species NOT DETECTED NOT DETECTED Final   Serratia marcescens NOT DETECTED NOT DETECTED Final   Haemophilus influenzae NOT DETECTED NOT DETECTED Final   Neisseria meningitidis NOT DETECTED NOT DETECTED Final   Pseudomonas aeruginosa NOT DETECTED NOT DETECTED Final   Stenotrophomonas maltophilia NOT DETECTED NOT DETECTED Final   Candida albicans NOT DETECTED NOT DETECTED Final   Candida auris NOT DETECTED NOT DETECTED Final   Candida glabrata NOT DETECTED NOT DETECTED Final   Candida krusei NOT DETECTED NOT DETECTED Final   Candida parapsilosis  NOT DETECTED NOT DETECTED Final   Candida tropicalis NOT DETECTED NOT DETECTED Final   Cryptococcus neoformans/gattii NOT DETECTED NOT DETECTED Final   Meth resistant mecA/C and MREJ NOT DETECTED NOT DETECTED Final    Comment: Performed at [facility], [address], [place], [place] [address]  MRSA PCR Screening     Status: None   Collection Time: 02/09/20  9:45 AM   Specimen: Nasal Mucosa; Nasopharyngeal  Result Value Ref Range Status   MRSA by PCR NEGATIVE NEGATIVE Final    Comment:        The GeneXpert MRSA Assay (FDA approved for NASAL specimens only), is one component of a comprehensive MRSA colonization surveillance program. It is not intended to diagnose MRSA infection nor to guide or monitor treatment for MRSA infections. Performed at [facility], [address] [place], [place] [address]   Culture, blood (Routine X  2) w Reflex to ID Panel     Status: Abnormal   Collection Time: 02/10/20  3:30 AM   Specimen: BLOOD LEFT HAND  Result Value Ref Range Status   Specimen Description BLOOD LEFT HAND  Final   Special Requests   Final    AEROBIC BOTTLE ONLY Blood Culture results may not be optimal due to an inadequate volume of blood received in culture bottles   Culture  Setup Time   Final    GRAM POSITIVE COCCI IN CLUSTERS AEROBIC BOTTLE ONLY CRITICAL VALUE NOTED.  VALUE IS CONSISTENT WITH PREVIOUSLY REPORTED AND CALLED VALUE.    Culture (A)  Final    STAPHYLOCOCCUS CAPITIS THE SIGNIFICANCE OF ISOLATING THIS ORGANISM FROM A SINGLE SET OF BLOOD CULTURES WHEN MULTIPLE SETS ARE DRAWN IS UNCERTAIN. PLEASE NOTIFY THE MICROBIOLOGY DEPARTMENT WITHIN ONE WEEK IF SPECIATION AND SENSITIVITIES ARE REQUIRED. Performed at [facility], [address] [place], [place] [address]    Report Status 02/13/2020 FINAL  Final  Culture, blood (Routine X 2) w Reflex to ID Panel     Status: None (Preliminary result)   Collection Time: 02/10/20  6:37 PM   Specimen: BLOOD  Result Value Ref Range Status   Specimen Description BLOOD SITE NOT SPECIFIED  Final   Special Requests   Final    BOTTLES DRAWN AEROBIC AND ANAEROBIC Blood Culture adequate volume   Culture   Final    NO GROWTH 4 DAYS Performed at [facility], [address] [place], [place] [address]    Report Status PENDING  Incomplete  Culture, blood (Routine X 2) w Reflex to ID Panel     Status: None (Preliminary result)   Collection Time: 02/13/20  9:23 AM   Specimen: BLOOD  Result Value Ref Range Status   Specimen Description BLOOD RIGHT ANTECUBITAL  Final   Special Requests   Final    BOTTLES DRAWN AEROBIC ONLY Blood Culture results may not be optimal due to an inadequate volume of blood received in culture bottles   Culture   Final    NO GROWTH 1 DAY Performed at [facility], [address] [place], [place] [address]    Report Status  PENDING  Incomplete  Culture, blood (Routine X 2) w Reflex to ID Panel     Status: None (Preliminary result)   Collection Time: 02/13/20  1:24 PM   Specimen: BLOOD RIGHT HAND  Result Value Ref Range Status   Specimen Description BLOOD RIGHT HAND  Final   Special Requests   Final  BOTTLES DRAWN AEROBIC AND ANAEROBIC Blood Culture adequate volume   Culture   Final    NO GROWTH < 24 HOURS Performed at [facility], [address], [place], [place] [address]    Report Status PENDING  Incomplete    Studies/Results: DG CHEST PORT 1 VIEW  Result Date: 02/13/2020 CLINICAL DATA:  80 year old male with hypoxia.  COVID-19. EXAM: PORTABLE CHEST 1 VIEW COMPARISON:  Chest CTA 02/02/2020. Portable chest 02/10/2020 and earlier. FINDINGS: Portable AP semi upright view at 0431 hours. The patient is less rotated today. Lung volumes are improved. Mediastinal contour appears stable with moderate to large hiatal hernia demonstrated by CTA. Visualized tracheal air column is within normal limits. Left greater than right confluent bilateral pulmonary opacity. Right upper lobe remains relatively spared. Ventilation is stable. No pneumothorax or definite pleural effusion. Stable visualized osseous structures. IMPRESSION: Stable COVID-19 pneumonia since 02/10/2020. Electronically Signed   By: Odessa Fleming M.D.   On: 02/13/2020 05:45      Assessment/Plan:  INTERVAL HISTORY:  Platelets stable switch from cefazolin to vancomycin  Bilateral lower extremity weakness noted today  Principal Problem:   Acute hypoxemic respiratory failure due to COVID-19 Javier Beard) Active Problems:   COVID-19 virus infection   Type 2 diabetes mellitus with vascular disease (HCC)   Rheumatoid arthritis (HCC)   PAF (paroxysmal atrial fibrillation) (HCC)   CAD (coronary artery disease)   Pneumonia due to COVID-19 virus   Hyponatremia   Multifocal pneumonia   Acute respiratory distress   Acute respiratory failure with hypoxia (HCC)    MSSA bacteremia   Hypoxia   Thrombocytopenia (HCC)    Javier Beard is a 80 y.o. male with history of rheumatoid arthritis, prior amputation of a toe, admission with COVID-pneumonia now found to have methicillin sensitive Staph aureus bacteremia.  He has developed thrombocytopenia possibly due to the beta-lactam is been changed from cefazolin to vancomycin  #1 Methicillin sensitive Staphylococcus aureus bacteremia:   Certainly could have developed this as a superinfection or secondary infection after his COVID-pneumonia    MRI of his cervical and thoracic spine motion degraded but no infection  With his lower extremity weakness I am going to get an MRI of his lumbar spine to make sure he does not have infection at that site  I would plan on giving him at least 4 weeks of systemic therapy.  TEE if cardiology will do one on him we are getting close to him being at 21 days  #2  Lower extremity weakness: Could be due to deconditioning but I am to get MRI of the lumbar spine to exclude pathology there  #3 TTpenia worsening: We have switched from cefazolin to vancomycin platelets are stable overnight  4 rheumatoid arthritis:  Would NOT let him have Simponi when he leaves and make sure he has completed treatment for his infection FIRST  #5 pressure ulcer: Continue to monitor  #6 COVID: has been treated for this and is in airborne, contact though nearing 21 days of isolation   LOS: 18 days   Acey Lav 02/14/2020, 4:55 PM

## 2020-02-14 NOTE — Progress Notes (Signed)
NAME:  Javier Beard, MRN:  [id number], DOB:  November 05, 1940, LOS: 18 ADMISSION DATE:  21-Feb-2020, CONSULTATION DATE:  02/14/20 REFERRING MD:  Dr. Loney Loh, CHIEF COMPLAINT:  Worsening dyspnea   Brief History:  80 y.o. 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  PCCM consulted for possible bipap  History of Present Illness:  Javier Beard is a 80 y.o. 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   Early morning hours of 1/12 Pt became acutely more dyspneic and had increasing oxygen requirements from 3L to 15L and non-rebreather.  He has not had increasing leukocytosis and no known HF.   ABG 7.4/26/188/19.   PCCM consulted for possible Bipap and ICU transfer. Pulmonary critical care called back 02/09/2020 for acute decompensation after 13 days of treatment of COVID.  Noted to have worsening renal failure, worsening mental status, and hypotension.  Pulmonary critical care at bedside at the request of rapid response team.  Transferred to the medical intensive care unit.  Family has been contacted for possible limited CODE BLUE status to avoid intubation if possible.  Past Medical History:   has a past medical history of Arthritis, Atrial fibrillation (HCC), Coronary artery disease, DM type 2 (diabetes mellitus, type 2) (HCC), Hypertension, and Rheumatoid arteritis (HCC).   Significant Hospital Events:  1/8 Admitted 1/12 PCCM consult, transfer to progressive  1/21 Worsened hypoxemia, septic shock due to pna on pressors - transferred to ICU 1/22 Cr improved, pressors weaned off 1/23 Cr better, remains  off pressors, O2 slowly weaning  Consults:  PCCM  Procedures:    Significant Diagnostic Tests:  1/12 CXR>>Increasing heterogeneous opacities throughout both lungs, concerning for worsening infection in the setting of COVID-19 1/14 Bubble study> LVEF 60-65%, no shunt. McConnels sign positive.  Echo 1/23 > LVEF 60-65%, RV function normal with mild enlargement. Moderate TR.  Micro Data:    Antimicrobials:   Remdesevir 1/7-1/11 02/09/2020 Zyvox 02/09/2020 cefepime 02/10/2020 >> cefazolin (MSSA bacteremia/pna)  Interim History / Subjective:  Slept better after getting morphine. Saturating 96% on 15L States breathing improved.  Objective   Blood pressure (!) 111/99, pulse 90, temperature (!) 97.5 F (36.4 C), temperature source Axillary, resp. rate (!) 29, height 5\' 6"  (1.676 m), weight 79.6 kg, SpO2 99 %.    FiO2 (%):  [70 %] 70 %   Intake/Output Summary (Last 24 hours) at 02/14/2020 0727 Last data filed at 02/14/2020 02/16/2020 Gross per 24 hour  Intake 1228.53 ml  Output 1800 ml  Net -571.47 ml   Filed Weights   02/03/20 0410 02/09/20 0915 02/12/20 0412  Weight: 72.6 kg 77.5 kg 79.6 kg   Constitutional: frail elderly man lying in bed  Eyes: EOMI, pupils equal, tracking Ears, nose, mouth, and throat: MM dry, HFNC in place Cardiovascular: RRR, ext warm Respiratory: occasional crackles at bases Gastrointestinal: soft, +BS Skin: No rashes, normal turgor Ext: trace LE edema Neurologic: moves all 4 ext to command Psychiatric: RASS -1  Stable thrombocytopenia K a bit low, repleting CRP up slightly No new chest imaging Down 0.6L  Resolved Hospital Problem list   Septic shock  Assessment & Plan:   Acute  hypoxemic respiratory failure COVID pneumonia MSSA pneumonia/bacteremia RA on multiple immunosuppressants including steroids at baseline Question volume overloaded state of heart Muscular deconditioning Thrombocytopenia with question of beta lactam effect AKI  stabilized  - Steroid wean as ordered - Prolonged abx per ID - Progressive mobility - Patient is DNR - DMARD re-initiation per ID discussion with primary - TEE at some point - Morphine should help with both air hunger and back pain, careful with sedation levels - Another dose lasix, replete K - PCCM available PRN   Myrla Halsted MD PCCM

## 2020-02-14 NOTE — Progress Notes (Signed)
Notified provider and dietitian patient refused to have feeding tube placed. No new orders at this time.

## 2020-02-14 NOTE — Progress Notes (Signed)
Notified provider patient will need PRN for MRI. No new orders at this time.

## 2020-02-15 ENCOUNTER — Inpatient Hospital Stay: Payer: Self-pay

## 2020-02-15 ENCOUNTER — Inpatient Hospital Stay (HOSPITAL_COMMUNITY): Payer: Medicare Other

## 2020-02-15 DIAGNOSIS — R7881 Bacteremia: Secondary | ICD-10-CM | POA: Diagnosis not present

## 2020-02-15 DIAGNOSIS — J9601 Acute respiratory failure with hypoxia: Secondary | ICD-10-CM | POA: Diagnosis not present

## 2020-02-15 DIAGNOSIS — L8990 Pressure ulcer of unspecified site, unspecified stage: Secondary | ICD-10-CM | POA: Insufficient documentation

## 2020-02-15 DIAGNOSIS — E871 Hypo-osmolality and hyponatremia: Secondary | ICD-10-CM | POA: Diagnosis not present

## 2020-02-15 DIAGNOSIS — R0603 Acute respiratory distress: Secondary | ICD-10-CM | POA: Diagnosis not present

## 2020-02-15 DIAGNOSIS — U071 COVID-19: Secondary | ICD-10-CM | POA: Diagnosis not present

## 2020-02-15 DIAGNOSIS — R531 Weakness: Secondary | ICD-10-CM

## 2020-02-15 LAB — BASIC METABOLIC PANEL
Anion gap: 9 (ref 5–15)
BUN: 16 mg/dL (ref 8–23)
CO2: 20 mmol/L — ABNORMAL LOW (ref 22–32)
Calcium: 7.3 mg/dL — ABNORMAL LOW (ref 8.9–10.3)
Chloride: 104 mmol/L (ref 98–111)
Creatinine, Ser: 0.59 mg/dL — ABNORMAL LOW (ref 0.61–1.24)
GFR, Estimated: >60 mL/min (ref >60)
Glucose, Bld: 123 mg/dL — ABNORMAL HIGH (ref 70–99)
Potassium: 4.3 mmol/L (ref 3.5–5.1)
Sodium: 133 mmol/L — ABNORMAL LOW (ref 135–145)

## 2020-02-15 LAB — CBC
HCT: 33.1 % — ABNORMAL LOW (ref 39.0–52.0)
Hemoglobin: 10.6 g/dL — ABNORMAL LOW (ref 13.0–17.0)
MCH: 30.4 pg (ref 26.0–34.0)
MCHC: 32 g/dL (ref 30.0–36.0)
MCV: 94.8 fL (ref 80.0–100.0)
Platelets: 75 10*3/uL — ABNORMAL LOW (ref 150–400)
RBC: 3.49 MIL/uL — ABNORMAL LOW (ref 4.22–5.81)
RDW: 15.9 % — ABNORMAL HIGH (ref 11.5–15.5)
WBC: 6.9 10*3/uL (ref 4.0–10.5)
nRBC: 0 % (ref 0.0–0.2)

## 2020-02-15 LAB — CULTURE, BLOOD (ROUTINE X 2)
Culture: NO GROWTH
Special Requests: ADEQUATE

## 2020-02-15 LAB — VANCOMYCIN, PEAK: Vancomycin Pk: 36 ug/mL (ref 30–40)

## 2020-02-15 LAB — MAGNESIUM: Magnesium: 2 mg/dL (ref 1.7–2.4)

## 2020-02-15 LAB — GLUCOSE, CAPILLARY
Glucose-Capillary: 133 mg/dL — ABNORMAL HIGH (ref 70–99)
Glucose-Capillary: 177 mg/dL — ABNORMAL HIGH (ref 70–99)
Glucose-Capillary: 148 mg/dL — ABNORMAL HIGH (ref 70–99)
Glucose-Capillary: 87 mg/dL (ref 70–99)

## 2020-02-15 LAB — C-REACTIVE PROTEIN: CRP: 14.6 mg/dL — ABNORMAL HIGH (ref <1.0)

## 2020-02-15 IMAGING — DX DG KNEE 1-2V PORT*L*
2 series · 2 of 2 positions shown · non-contrast
Comparison: None.

CLINICAL DATA: Left knee pain, no known injury, initial encounter

EXAM:
PORTABLE LEFT KNEE - 1-2 VIEW

[knee ap]
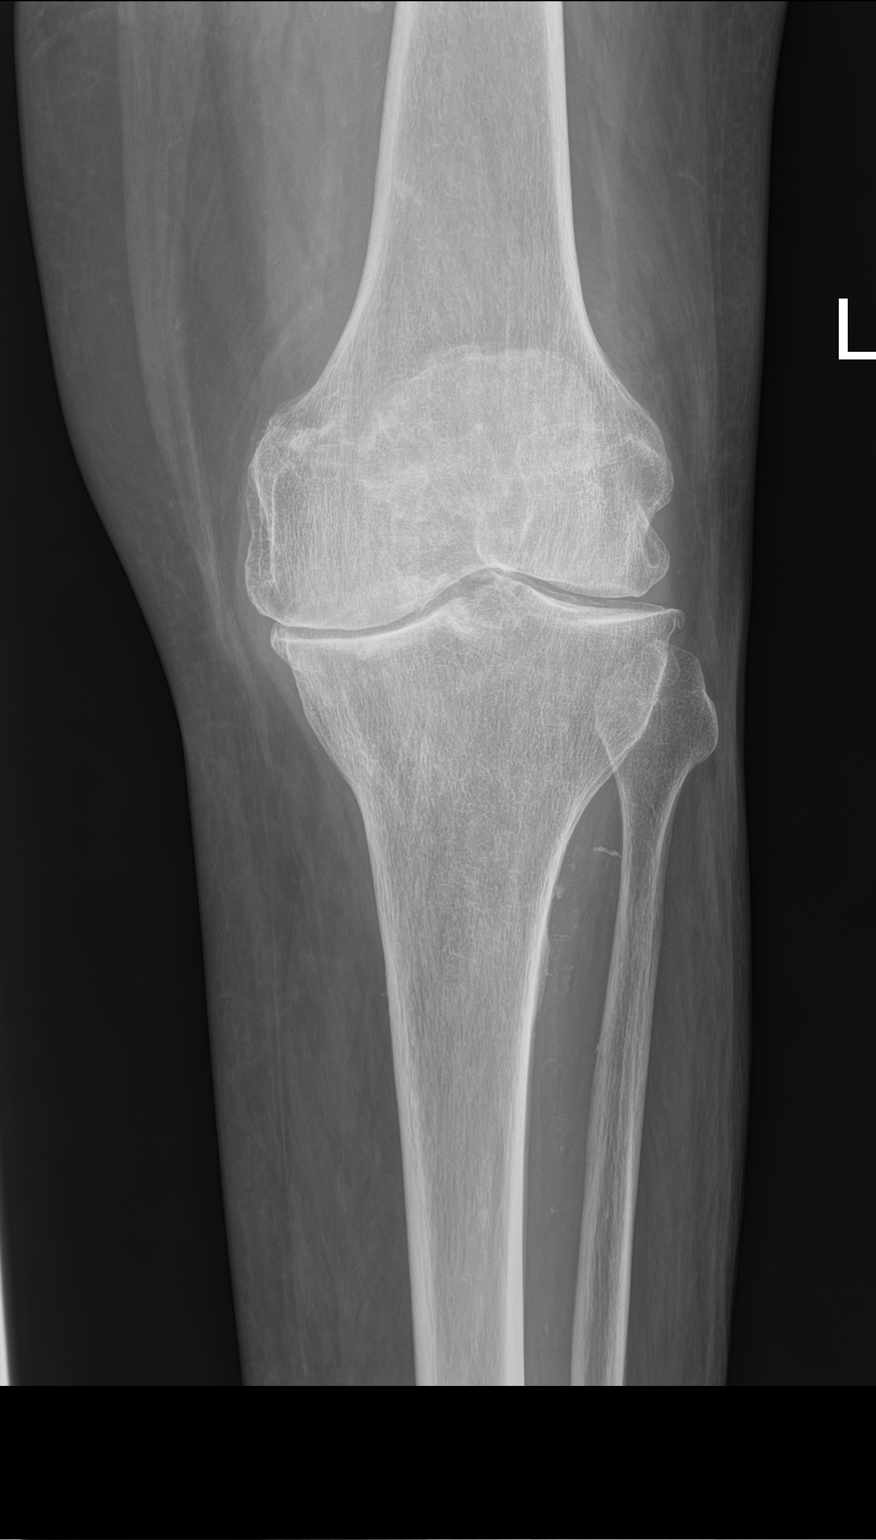

[knee obl]
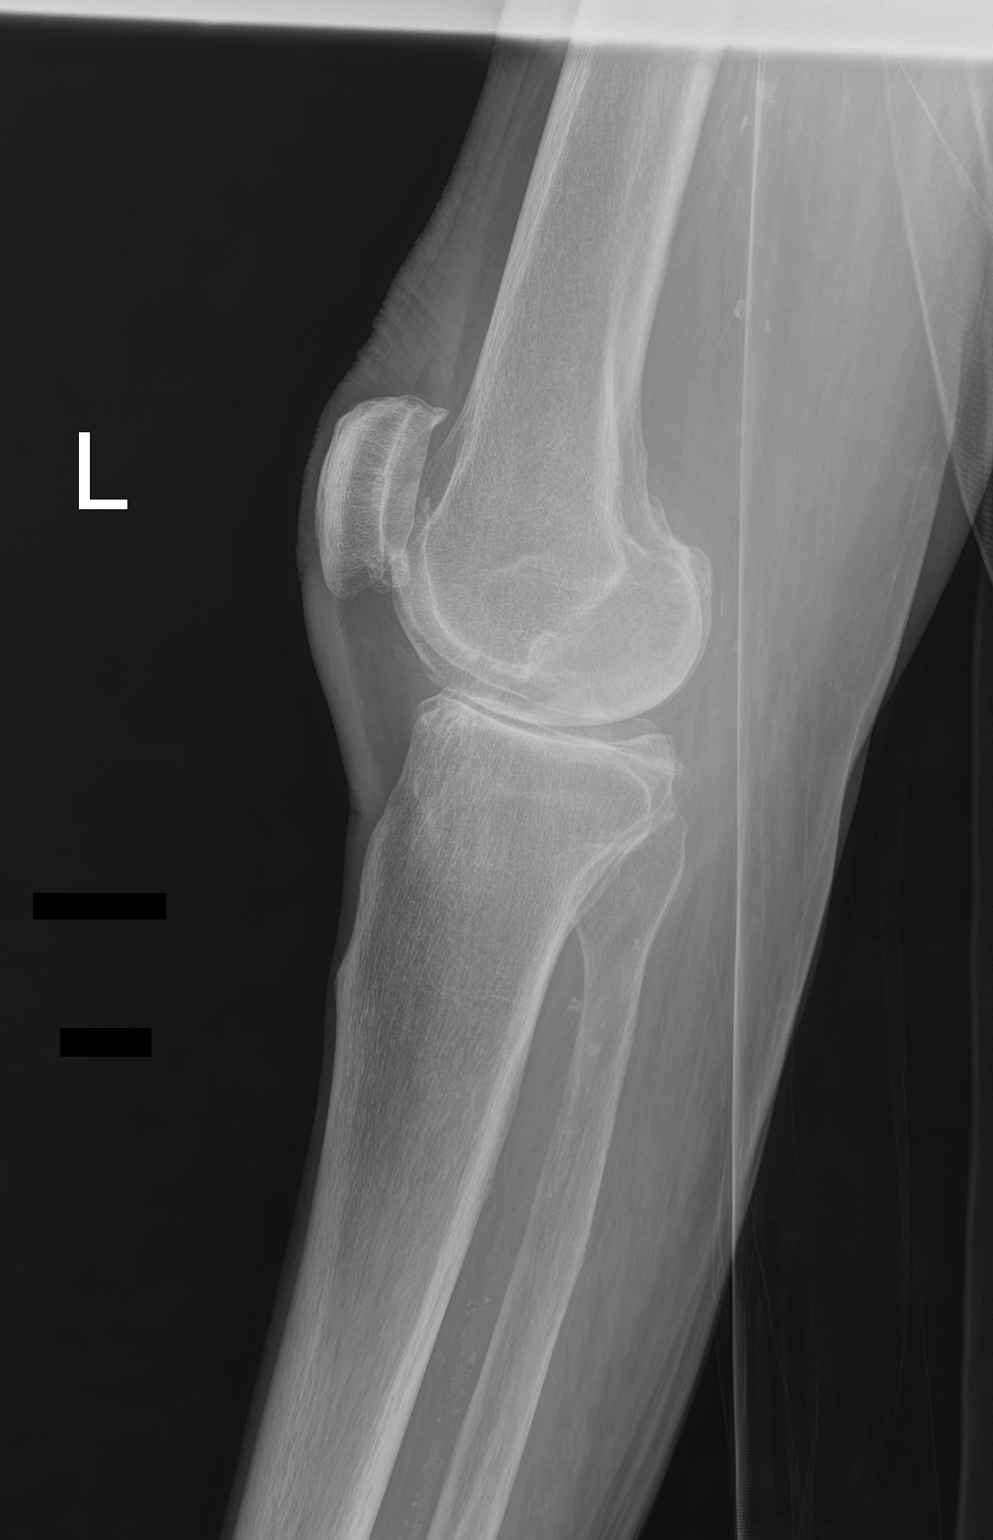

[2 of 2 positions shown; findings below may reference images not displayed]

FINDINGS: Tricompartmental degenerative changes are noted worst in the medial
joint space. No joint effusion is seen. No acute fracture or
dislocation is noted.
IMPRESSION: Degenerative change without acute abnormality.

## 2020-02-15 IMAGING — DX DG KNEE 1-2V PORT*R*
2 series · 2 of 2 positions shown · non-contrast
Comparison: None.

CLINICAL DATA: Knee pain, no known injury, initial encounter

EXAM:
PORTABLE RIGHT KNEE - 2 VIEW

[knee ap]
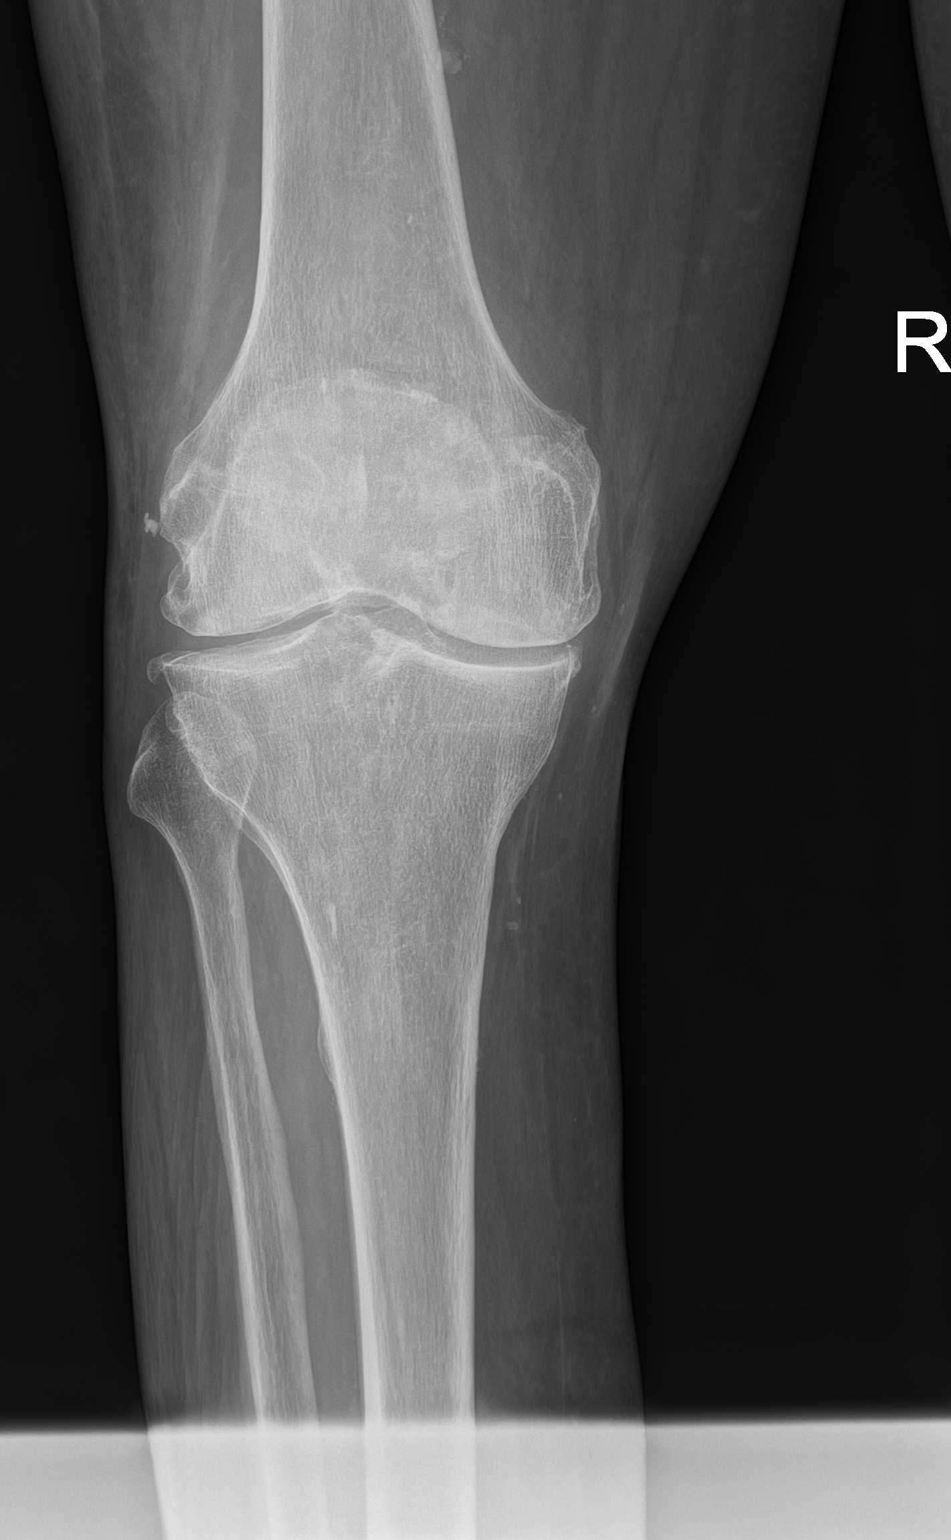

[knee lat]
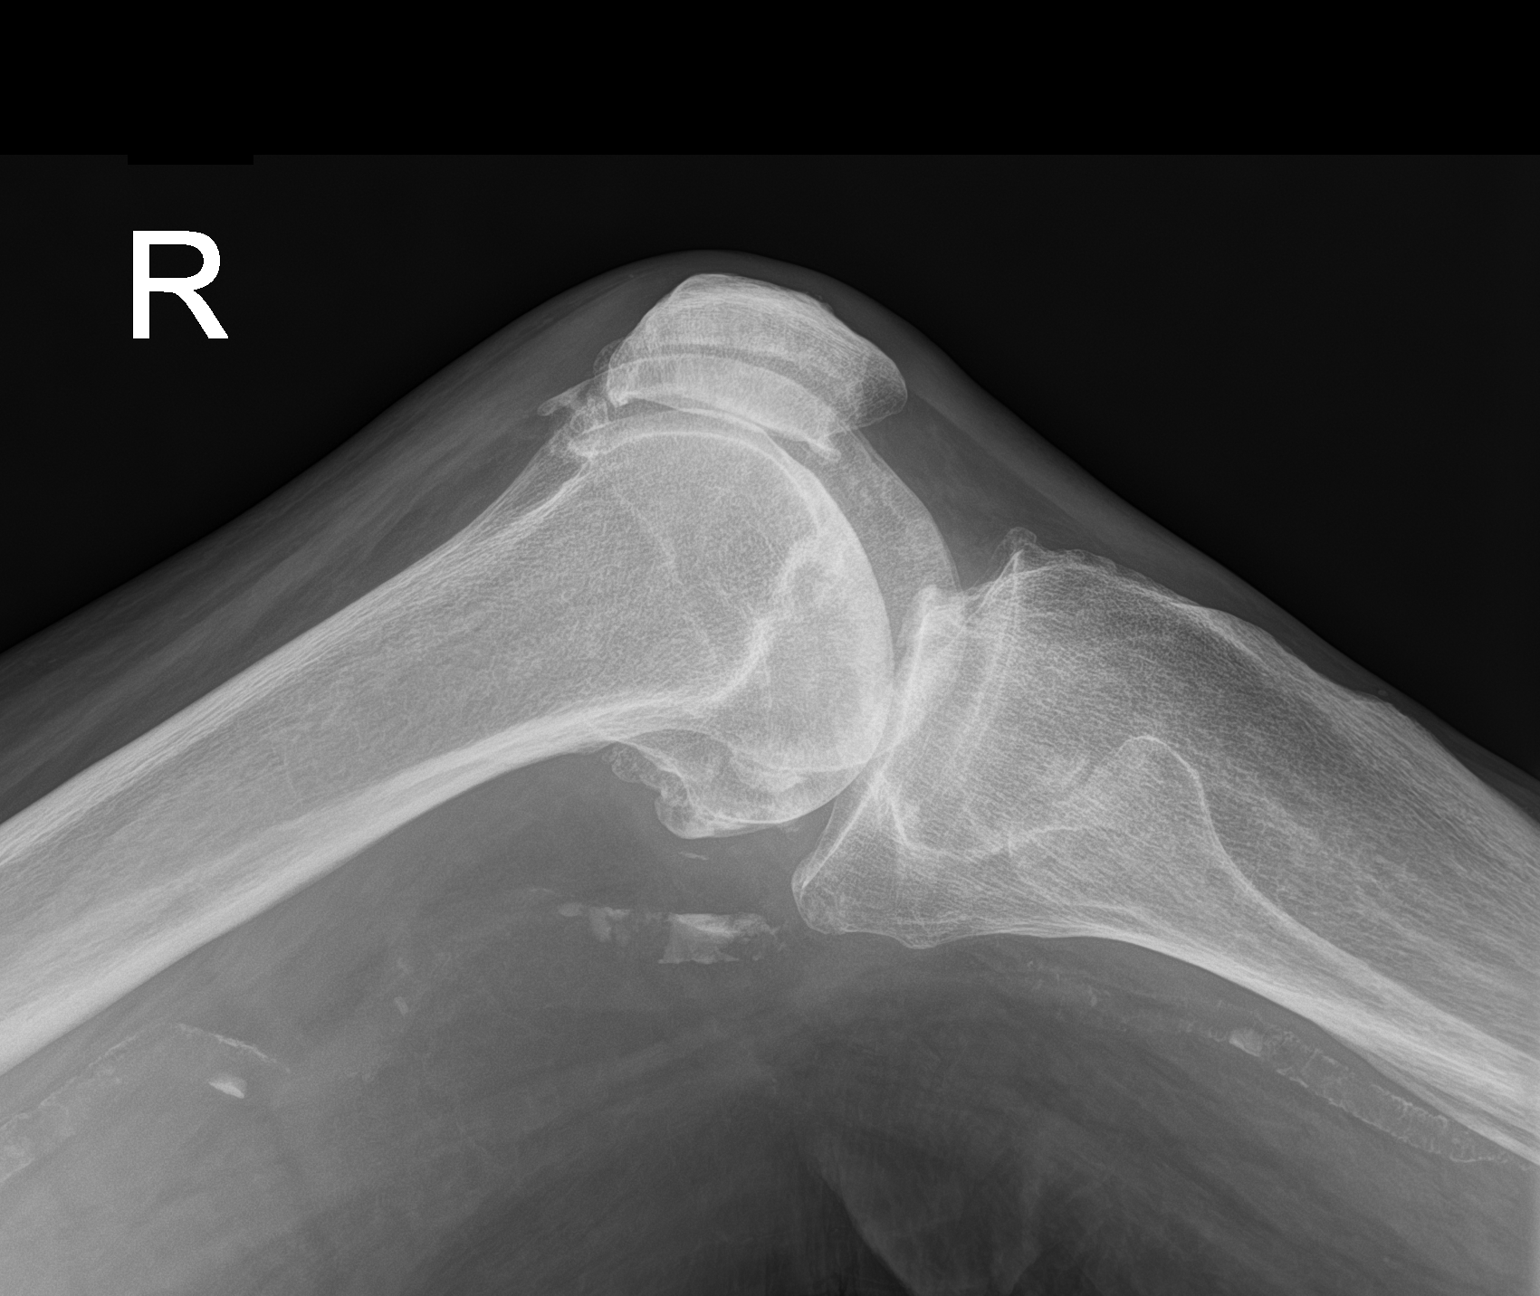

[2 of 2 positions shown; findings below may reference images not displayed]

FINDINGS: Tricompartmental degenerative changes are noted. No joint effusion
is seen. No acute fracture or dislocation is noted.
IMPRESSION: Degenerative change without acute abnormality.

## 2020-02-15 MED ORDER — GABAPENTIN 100 MG PO CAPS
100.0000 mg | ORAL_CAPSULE | Freq: Every day | ORAL | Status: DC
  Administered 2020-02-15 – 2020-02-21 (×7): 100 mg via ORAL
  Filled 2020-02-15 (×7): qty 1

## 2020-02-15 MED ORDER — OXYCODONE HCL 5 MG PO TABS
5.0000 mg | ORAL_TABLET | ORAL | Status: DC | PRN
  Administered 2020-02-16 – 2020-02-21 (×12): 5 mg via ORAL
  Filled 2020-02-15 (×12): qty 1

## 2020-02-15 MED ORDER — LIDOCAINE 5 % EX PTCH
1.0000 | MEDICATED_PATCH | CUTANEOUS | Status: DC
  Administered 2020-02-15 – 2020-02-22 (×7): 1 via TRANSDERMAL
  Filled 2020-02-15 (×8): qty 1

## 2020-02-15 MED ORDER — METHOCARBAMOL 500 MG PO TABS
500.0000 mg | ORAL_TABLET | Freq: Three times a day (TID) | ORAL | Status: DC | PRN
  Administered 2020-02-20: 500 mg via ORAL
  Filled 2020-02-15: qty 1

## 2020-02-15 NOTE — Progress Notes (Signed)
Occupational Therapy Treatment Patient Details Name: Javier Beard MRN: [id number] DOB: Feb 07, 1940 Today's Date: 02/15/2020    History of present illness Pt is a 80 y.o. male admitted 02/05/2020 with worsening SOB, nausea/vomitting. Workup for acute hypoxic respiratory failure due to COVID-19 PNA. CTA 1/14 negative for PE. Pt with worsening respiratory status and AMS early AM 1/21; head CT negative for acute abnormality. PMH includes RA, DM2, afib, HF.   OT comments  Session completed on 6L HFNC with SpO2 maintaining above 86 with activity. Mod A with bed mobility and sit - stand. Declined mobilizing OOB as he was convinced he had just been back to bed for 1 hr (has not been OOB since yesterday). Completed sit - stand bedside, grooming sitting EOB then bed level exercise. Theraband left in room and encouraged nsg to use with pt. Incentive spirometer x 7 - only pulling @ . REcommend rehab at [facility]. Pt complaining of R calf pain - nsg notified.   Follow Up Recommendations  SNF;Supervision/Assistance - 24 hour    Equipment Recommendations  None recommended by OT    Recommendations for Other Services      Precautions / Restrictions Precautions Precautions: Fall;Other (comment) Precaution Comments: watch SpO2       Mobility Bed Mobility Overal bed mobility: Needs Assistance Bed Mobility: Supine to Sit;Sit to Supine     Supine to sit: Mod assist Sit to supine: Mod assist      Transfers Overall transfer level: Needs assistance   Transfers: Stand Pivot Transfers Sit to Stand: Mod assist              Balance     Sitting balance-Leahy Scale: Fair       Standing balance-Leahy Scale: Poor                             ADL either performed or assessed with clinical judgement   ADL       Grooming: Wash/dry face;Oral care;Minimal assistance                                       Vision   Additional Comments: keeps eyes closed majority of  session   Perception     Praxis      Cognition Arousal/Alertness: Awake/alert Behavior During Therapy: Flat affect;Agitated ("why don't you stop irritating me") Overall Cognitive Status: Impaired/Different from baseline Area of Impairment: Orientation;Attention;Memory;Safety/judgement;Awareness;Problem solving                 Orientation Level: Disoriented to;Time Current Attention Level: Sustained Memory: Decreased short-term memory Following Commands: Follows one step commands with increased time Safety/Judgement: Decreased awareness of safety;Decreased awareness of deficits Awareness: Emergent Problem Solving: Slow processing General Comments: Pt told this therapist he had been to the chair this am an donly back in the bed for an hour. AFter session discussed with nurse and she said that he heasn't been OOB since yesterday        Exercises General Exercises - Upper Extremity Elbow Extension: Both;10 reps;Supine Other Exercises Other Exercises: sit - stnad x 1 then declined further trials Other Exercises: incentive spirometer x 10 - able to pull 250 ml   Shoulder Instructions       General Comments      Pertinent Vitals/ Pain       Pain Assessment: Faces Faces Pain Scale: Hurts  even more Pain Location:  (back; butt; R leg) Pain Descriptors / Indicators: Aching;Discomfort;Grimacing;Guarding;Moaning Pain Intervention(s): Limited activity within patient's tolerance  Home Living                                          Prior Functioning/Environment              Frequency  Min 2X/week        Progress Toward Goals  OT Goals(current goals can now be found in the care plan section)  Progress towards OT goals: Goals drowngraded-see care plan  ADL Goals Pt Will Perform Grooming: with min assist;standing Pt Will Perform Upper Body Bathing: with min assist;sitting Pt Will Perform Lower Body Bathing: with mod assist;sit to/from stand Pt  Will Transfer to Toilet: with min assist;bedside commode;stand pivot transfer Pt Will Perform Toileting - Clothing Manipulation and hygiene: with min assist;sitting/lateral leans Pt/caregiver will Perform Home Exercise Program: Increased strength;Both right and left upper extremity;With theraband;With Supervision;With written HEP provided Additional ADL Goal #1: Pt will actively participate in 20 mins therapeutic activity with no more than 2 rest breaks and Sp02 >88%  Plan Discharge plan remains appropriate;Frequency needs to be updated    Co-evaluation                 AM-PAC OT "6 Clicks" Daily Activity     Outcome Measure   Help from another person eating meals?: A Little Help from another person taking care of personal grooming?: A Little Help from another person toileting, which includes using toliet, bedpan, or urinal?: A Lot Help from another person bathing (including washing, rinsing, drying)?: A Lot Help from another person to put on and taking off regular upper body clothing?: A Lot Help from another person to put on and taking off regular lower body clothing?: Total 6 Click Score: 13    End of Session Equipment Utilized During Treatment: Oxygen (6L)  OT Visit Diagnosis: Unsteadiness on feet (R26.81);Other abnormalities of gait and mobility (R26.89);Muscle weakness (generalized) (M62.81);Other symptoms and signs involving cognitive function;Pain Pain - part of body:  (butt; back; R leg)   Activity Tolerance Patient limited by fatigue;Other (comment) (limited by what pt's participation)   Patient Left in bed;with call bell/phone within reach;with bed alarm set   Nurse Communication Mobility status;Other (comment) (participation with OT; encourage theraband ex)        Time: 4627-0350 OT Time Calculation (min): 30 min  Charges: OT General Charges $OT Visit: 1 Visit OT Treatments $Self Care/Home Management : 8-22 mins $Therapeutic Exercise: 8-22 mins  Luisa Dago, OT/L   Acute OT Clinical Specialist Acute Rehabilitation Services Pager [phone] Office [phone]    [facility] 02/15/2020, 1:55 PM

## 2020-02-15 NOTE — Progress Notes (Signed)
[facility] Progress Note  Patient: Javier Beard    HTD:[id number]  DOA: 31-Jan-2020     Date of Service: the patient was seen and examined on 02/15/2020  Brief hospital course: Possible due to history of rheumatoid arthritis, type II DM, A. fib, chronic HFpEF.  Presents with complaints of cough and shortness of breath.  Found to have COVID-19 pneumonia with hypoxia  Currently plan is continue supportive care for hypoxia.  Assessment and Plan: 1. Acute Hypoxic respiratory failure, POA, 92 % on room air on admission.  Acute COVID-19 Viral Pneumonia CXR: hazy bilateral peripheral opacities CTA chest PE protocol 1/14; negative for PE Oxygen requirement: At rest 12 LPM.  CRP: 14.6. Remdesivir: Completed on 1/11 Steroids: On prednisone taper Baricitinib/Actemra: Not initiated,not indicated. Antibiotics: Initiated on 02/09/2020 DVT Prophylaxis:  apixaban (ELIQUIS) tablet 5 mg  Prone positioning and incentive spirometer use recommended.  Overall plan: Continue supportive measures for COVID-19 pneumonia and monitor for complications Last day of isolation 02/16/2020.  The treatment plan and use of medications and known side effects were discussed with patient/family. It was clearly explained that complete risks and long-term side effects are unknown. Patient/family agree with the treatment plan.    2.    Septic shock, not POA with MSSA bacteremia and MSSA pneumonia. L4-L5 septic arthritis. Paraspinous musculature myositis. 2.9 cm soft tissue abscess. Epidural phlegmon without frank epidural abscess. Patient's oxygen condition worsen on 1/21 during transfer to ICU. Blood cultures came back positive for MSSA. Started on IV cefazolin. Currently ID following. Echocardiogram negative for vegetation. Recommend TEE when appropriate from cardiology perspective. MRI C-spine T-spine negative for acute infection. MRI L-spine concerning for septic arthritis. 6 weeks of IV antibiotics  recommended. PICC line inserted. Patient would require a repeat MRI L-spine to ensure resolution of the arthritis. IV cefazolin changed to IV vancomycin in the setting of thrombocytopenia.  3. Rheumatoid arthritis Currently holding all medication. Continue to hold methotrexate and Simponi until he has finished his antibiotics. Continue to hold Plaquenil for at least 2 weeks. Continue prednisone.  4.  Type 2 diabetes mellitus uncontrolled with hyperglycemia and hypoglycemia without any complication Currently on sliding scale insulin, regimen adjusted  5.  Permanent A. fib On anticoagulation with Eliquis.  Rate controlled.  Without any medication.  6.  Chronic HFpEF Patient was on 60 mg of oral Lasix. Echocardiogram negative for any acute abnormality per Echocardiogram with bubble study also negative for any intra-chamber shunt. Diuretics on hold for now.  7. Acute kidney injury/hyponatremia Likely due to septic shock/hypovolemia/ATN.  Improved.  Monitor sodium levels.  8. Normocytic anemia No evidence of overt bleeding.  Drop in hemoglobin as noted.  Possibly due to dilution.  Continue to trend.  9. Thrombocytopenia Platelet count noted to be slowly trending downwards.  Possibly due to sepsis.  Patient however is on apixaban. Medications reviewed.  For now we will hold his hydroxychloroquine. Peripheral smear negative for any schistocytes or acute abnormality. Cefazolin can also cause thrombocytopenia.  Currently on vancomycin  10. Toxic metabolic encephalopathy In the setting of sepsis, renal failure, hypoxemia ICU stay.  Improving.  11.  Poor p.o. intake. Dietary recommended cortrak.  PCCM ordered 1. Unable to insert it without agitation and therefore patient currently refusing it. Monitor. Body mass index is 28.32 kg/m.   Malnutrition Type: Nutrition Problem: Inadequate oral intake Etiology: decreased appetite,acute illness Nutrition  Interventions: Interventions: Tube feeding,Liberalize Diet   12.  Medial sacral DTI. POA Continue foam dressing  Pressure Injury 02/09/20  Sacrum Medial Stage 2 -  Partial thickness loss of dermis presenting as a shallow open injury with a red, pink wound bed without slough. (Active)  02/09/20 0900  Location: Sacrum  Location Orientation: Medial  Staging: Stage 2 -  Partial thickness loss of dermis presenting as a shallow open injury with a red, pink wound bed without slough.  Wound Description (Comments):   Present on Admission: Yes     Diet: Cardiac diet DVT Prophylaxis:    apixaban (ELIQUIS) tablet 5 mg    Advance goals of care discussion: Full code  Family Communication: no family was present at bedside, at the time of interview.  Discussed on the phone with daughter on 1/26  Disposition:  Status is: Inpatient  Remains inpatient appropriate because:Inpatient level of care appropriate due to severity of illness   Dispo: The patient is from: Home              Anticipated d/c is to: Home PT recommends SNF although patient wants to go home.              Anticipated d/c date is: 3 days              Patient currently is not medically stable to d/c.  Subjective: Still has shortness of breath and vomiting Holroyd feeding tube  Physical Exam:  General: Appear in mild distress, no Rash; Oral Mucosa Clear, moist. no Abnormal Neck Mass Or lumps, Conjunctiva normal  Cardiovascular: S1 and S2 Present, no Murmur, Respiratory: increased respiratory effort, Bilateral Air entry present and bilateral  Crackles, no wheezes Abdomen: Bowel Sound present, Soft and no tenderness Extremities: trace Pedal edema Neurology: alert and oriented to time, place, and person affect appropriate. no new focal deficit Gait not checked due to patient safety concerns    Vitals:   02/15/20 1400 02/15/20 1500 02/15/20 1600 02/15/20 1815  BP:   118/69 (!) 148/74  Pulse: 82 83 86 85  Resp: (!) 26 (!) 22  (!) 27 20  Temp:   97.8 F (36.6 C) 97.8 F (36.6 C)  TempSrc:   Axillary Oral  SpO2: 100% 100% 98% 99%  Weight:      Height:        Intake/Output Summary (Last 24 hours) at 02/15/2020 2028 Last data filed at 02/15/2020 1200 Gross per 24 hour  Intake 445.71 ml  Output 1050 ml  Net -604.29 ml   Filed Weights   02/03/20 0410 02/09/20 0915 02/12/20 0412  Weight: 72.6 kg 77.5 kg 79.6 kg    Data Reviewed: I have personally reviewed and interpreted daily labs, tele strips, imaging. I reviewed all nursing notes, pharmacy notes, vitals, pertinent old records I have discussed plan of care as described above with RN and patient/family.  CBC: Recent Labs  Lab 02/11/20 0209 02/12/20 0418 02/13/20 1310 02/14/20 0307 02/15/20 0756  WBC 15.6* 9.8 8.5 8.8 6.9  HGB 9.5* 9.7* 11.0* 10.9* 10.6*  HCT 27.8* 30.3* 33.1* 32.1* 33.1*  MCV 93.0 95.3 92.7 92.8 94.8  PLT 83* 63* 64* 64* 75*   Basic Metabolic Panel: Recent Labs  Lab 02/10/20 0308 02/11/20 0209 02/12/20 0418 02/13/20 0843 02/14/20 0307 02/15/20 0756  NA 135 132* 130* 131* 133* 133*  K 5.2* 3.8 3.8 4.0 3.6 4.3  CL 106 105 104 104 105 104  CO2 15* 19* 18* 18* 18* 20*  GLUCOSE 208* 102* 127* 58* 85 123*  BUN 31* 21 18 18 18 16   CREATININE 1.32* 0.77 0.69 0.70  0.70 0.59*  CALCIUM 7.5* 7.4* 7.5* 7.5* 7.6* 7.3*  MG 2.1 2.1 2.1 2.0 2.0 2.0  PHOS 4.0  --   --   --   --   --     Studies: MR Lumbar Spine W Wo Contrast  Result Date: 02/15/2020 CLINICAL DATA:  Initial evaluation for back pain, epidural abscess suspected. EXAM: MRI LUMBAR SPINE WITHOUT AND WITH CONTRAST TECHNIQUE: Multiplanar and multiecho pulse sequences of the lumbar spine were obtained without and with intravenous contrast. CONTRAST:  7.90mL GADAVIST GADOBUTROL 1 MMOL/ML IV SOLN COMPARISON:  Prior MRI from 08/02/2015. FINDINGS: Segmentation: Standard. Lowest well-formed disc space labeled the L5-S1 level. 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 Additional trace 2 mm anterolisthesis of L4 on L5. 3 mm retrolisthesis of L1 on L2. Vertebrae: Mild chronic anterior wedging deformity noted at the superior endplate of T11. Vertebral body height otherwise maintained without acute or subacute fracture. Underlying bone marrow signal intensity within normal limits. No discrete or worrisome osseous lesions. No abnormal marrow edema or enhancement. No findings to suggest osteomyelitis discitis. There is a prominent joint effusion involving the right L4-5 facet (series 11, image 26). Prominent surrounding soft tissue edema and enhancement. Constellation of findings highly suspicious for acute septic arthritis with associated myositis/soft tissue infection. Several T2 hyperintense irregular rim enhancing collections seen within the posterior soft tissues immediately posterior to the right L4-5 facet, consistent with abscess. Largest of these collections measures 1.6 x 1.5 x 2.9 cm (series 10, image 3). An adjacent 1.9 cm collection appears to communicate with the right L4-5 facet itself (series 8, image 25). Small joint effusion with mild soft tissue edema about the contralateral left L4-5 facet as well, which could also be involved. Soft tissue signal intensity within the right posterior and dorsal epidural space likely reflects mild phlegmon (series 9, image 26). No frank epidural abscess at this time. Small joint effusions noted within the bilateral L3-4 facets as well without convincing evidence for septic arthritis. Conus medullaris and cauda equina: Conus extends to the L1 level. Conus and cauda equina appear normal. Paraspinal and other soft tissues: No other acute abnormality within the paraspinous soft tissues. Asymmetric atrophy noted within the left psoas muscle. Multifocal atheromatous irregularity noted throughout the visualized aorta. Visualized visceral structures otherwise unremarkable. Disc levels: L1-2:  Retrolisthesis. Diffuse disc bulge with disc desiccation and intervertebral disc space narrowing. Disc bulging eccentric to the left. Mild facet hypertrophy. Mild narrowing of the left lateral recess without significant spinal stenosis. Mild left L1 foraminal narrowing. Right neural foramen remains patent. L2-3: Mild disc bulge with disc desiccation. Mild facet hypertrophy. No significant spinal stenosis. Foramina remain patent. L3-4: Mild disc bulge with disc desiccation. Mild to moderate bilateral facet hypertrophy with associated trace joint effusions. No significant spinal stenosis. Foramina remain patent. L4-5: Trace anterolisthesis. Mild disc bulge with disc desiccation. Moderate bilateral facet hypertrophy with associated joint effusions. Findings of septic arthritis on the right. No significant spinal stenosis. Mild bilateral L4 foraminal narrowing. 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 Degenerative intervertebral disc space narrowing with disc desiccation and broad posterior pseudo disc bulge. Severe bilateral facet arthrosis no significant spinal stenosis. Severe right with moderate left L5 foraminal stenosis. IMPRESSION: 1. Findings consistent with acute septic arthritis involving the right L4-5 facet. Associated edema and enhancement within the adjacent posterior paraspinous musculature consistent with associated soft tissue infection/myositis. Superimposed multifocal soft tissue abscesses measuring up  to 2.9 cm as above. Trace epidural phlegmon without frank epidural abscess at this time. 2. 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 3. Additional mild multilevel noncompressive disc bulging elsewhere within the lumbar spine as above. No other high-grade stenosis or evidence for neural impingement. Electronically Signed   By: Rise Mu M.D.   On:  02/15/2020 03:45   DG Knee Left Port  Result Date: 02/15/2020 CLINICAL DATA:  Left knee pain, no known injury, initial encounter EXAM: PORTABLE LEFT KNEE - 1-2 VIEW COMPARISON:  None. FINDINGS: Tricompartmental degenerative changes are noted worst in the medial joint space. No joint effusion is seen. No acute fracture or dislocation is noted. IMPRESSION: Degenerative change without acute abnormality. Electronically Signed   By: Alcide Clever M.D.   On: 02/15/2020 10:59   DG Knee Right Port  Result Date: 02/15/2020 CLINICAL DATA:  Knee pain, no known injury, initial encounter EXAM: PORTABLE RIGHT KNEE - 2 VIEW COMPARISON:  None. FINDINGS: Tricompartmental degenerative changes are noted. No joint effusion is seen. No acute fracture or dislocation is noted. IMPRESSION: Degenerative change without acute abnormality. Electronically Signed   By: Alcide Clever M.D.   On: 02/15/2020 10:58   [place] EKG SITE RITE  Result Date: 02/15/2020 If Site Rite image not attached, placement could not be confirmed due to current cardiac rhythm.   Scheduled Meds: . (feeding supplement) PROSource Plus  30 mL Oral TID WC  . apixaban  5 mg Oral BID  . vitamin C  500 mg Oral Daily  . calcium carbonate  1,250 mg Oral Q breakfast  . calcium carbonate  0.5 tablet Oral Q supper  . chlorhexidine  15 mL Mouth Rinse BID  . Chlorhexidine Gluconate Cloth  6 each Topical Daily  . docusate sodium  100 mg Oral BID  . feeding supplement  1 Container Oral TID BM  . folic acid  1 mg Oral Daily  . gabapentin  100 mg Oral QHS  . insulin aspart  0-9 Units Subcutaneous TID WC  . lidocaine  1 patch Transdermal Q24H  . mouth rinse  15 mL Mouth Rinse BID  . pantoprazole  40 mg Oral Daily  . polyethylene glycol  17 g Oral Daily  . pravastatin  80 mg Oral q1800  . predniSONE  20 mg Oral Q breakfast   Followed by  . [START ON 02/18/2020] predniSONE  10 mg Oral Q breakfast   Followed by  . [START ON 02/23/2020] predniSONE  5 mg Oral Q  breakfast  . vitamin B-12  1,000 mcg Oral Daily  . zinc sulfate  220 mg Oral Daily   Continuous Infusions: . sodium chloride Stopped (02/14/20 1855)  . vancomycin 1,000 mg (02/15/20 1730)   PRN Meds: acetaminophen **OR** acetaminophen, methocarbamol, morphine injection, oxyCODONE, senna-docusate  Time spent: 35 minutes  Author: Lynden Oxford, MD [facility] 02/15/2020 8:28 PM  To reach On-call, see care teams to locate the attending and reach out via [email or web address]. Between 7PM-7AM, please contact night-coverage If you still have difficulty reaching the attending provider, please page the [facility] (Director on Call) for [facility] on amion for assistance.

## 2020-02-15 NOTE — Progress Notes (Signed)
Patient transferred to 5W from [place]. Patient is alert and oriented x 1-2. Vital signs are stable and he is on 2L of oxygen. Has no complaints of pain. 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 Patient belongings at bedside (clothing, home O2, glasses). The patient was shown how to use the call bell. Call bell, phone and bedside table are within reach; bed is in the lowest position.

## 2020-02-15 NOTE — Progress Notes (Signed)
Subjective:  Complaining of back pain, apparently also complaining of knee pain that I hearda bout later after having seen the pt  Antibiotics:  Anti-infectives (From admission, onward)   Start     Dose/Rate Route Frequency Ordered Stop   02/14/20 0600  vancomycin (VANCOCIN) IVPB 1000 mg/200 mL premix        1,000 mg 200 mL/hr over 60 Minutes Intravenous Every 12 hours 02/13/20 1348     02/13/20 1600  vancomycin (VANCOREADY) IVPB 1750 mg/350 mL        1,750 mg 175 mL/hr over 120 Minutes Intravenous  Once 02/13/20 1348 02/13/20 1924   02/09/20 2300  ceFAZolin (ANCEF) IVPB 2g/100 mL premix  Status:  Discontinued        2 g 200 mL/hr over 30 Minutes Intravenous Every 8 hours 02/09/20 2233 02/13/20 1349   02/09/20 1000  ceFEPIme (MAXIPIME) 1 g in sodium chloride 0.9 % 100 mL IVPB  Status:  Discontinued        1 g 200 mL/hr over 30 Minutes Intravenous Every 12 hours 02/09/20 0846 02/09/20 2233   02/09/20 1000  linezolid (ZYVOX) IVPB 600 mg  Status:  Discontinued        600 mg 300 mL/hr over 60 Minutes Intravenous Every 12 hours 02/09/20 0846 02/09/20 2233   01/27/20 1000  remdesivir 100 mg in sodium chloride 0.9 % 100 mL IVPB        100 mg 200 mL/hr over 30 Minutes Intravenous Daily 02-12-2020 1602 01/30/20 1418   01/27/20 1000  hydroxychloroquine (PLAQUENIL) tablet 200 mg  Status:  Discontinued        200 mg Oral 2 times daily 01/27/20 0358 02/13/20 1155   02-12-2020 1630  remdesivir 100 mg in sodium chloride 0.9 % 100 mL IVPB        100 mg 200 mL/hr over 30 Minutes Intravenous Every 30 min 02/12/20 1602 12-Feb-2020 1745      Medications: Scheduled Meds: . (feeding supplement) PROSource Plus  30 mL Oral TID WC  . apixaban  5 mg Oral BID  . vitamin C  500 mg Oral Daily  . calcium carbonate  1,250 mg Oral Q breakfast  . calcium carbonate  0.5 tablet Oral Q supper  . chlorhexidine  15 mL Mouth Rinse BID  . Chlorhexidine Gluconate Cloth  6 each Topical Daily  . docusate  sodium  100 mg Oral BID  . feeding supplement  1 Container Oral TID BM  . folic acid  1 mg Oral Daily  . gabapentin  100 mg Oral QHS  . insulin aspart  0-9 Units Subcutaneous TID WC  . lidocaine  1 patch Transdermal Q24H  . mouth rinse  15 mL Mouth Rinse BID  . pantoprazole  40 mg Oral Daily  . polyethylene glycol  17 g Oral Daily  . pravastatin  80 mg Oral q1800  . predniSONE  20 mg Oral Q breakfast   Followed by  . [START ON 02/18/2020] predniSONE  10 mg Oral Q breakfast   Followed by  . [START ON 02/23/2020] predniSONE  5 mg Oral Q breakfast  . vitamin B-12  1,000 mcg Oral Daily  . zinc sulfate  220 mg Oral Daily   Continuous Infusions: . sodium chloride Stopped (02/14/20 1855)  . vancomycin 1,000 mg (02/15/20 0551)   PRN Meds:.acetaminophen **OR** acetaminophen, methocarbamol, morphine injection, oxyCODONE, senna-docusate    Objective: Weight change:   Intake/Output Summary (Last 24 hours) at  02/15/2020 1021 Last data filed at 02/15/2020 0800 Gross per 24 hour  Intake 902.44 ml  Output 1150 ml  Net -247.56 ml   Blood pressure 113/66, pulse 89, temperature 98.7 F (37.1 C), temperature source Axillary, resp. rate (!) 28, height 5\' 6"  (1.676 m), weight 79.6 kg, SpO2 96 %. Temp:  [96.8 F (36 C)-98.7 F (37.1 C)] 98.7 F (37.1 C) (01/27 0743) Pulse Rate:  [68-98] 89 (01/27 0803) Resp:  [17-34] 28 (01/27 0803) BP: (103-135)/(55-102) 113/66 (01/27 0700) SpO2:  [92 %-100 %] 96 % (01/27 0803)  Physical Exam: Physical Exam HENT:     Head: Normocephalic and atraumatic.  Eyes:     Extraocular Movements: Extraocular movements intact.  Cardiovascular:     Rate and Rhythm: Rhythm irregular.     Heart sounds: Murmur heard.    Pulmonary:     Effort: No respiratory distress.  Abdominal:     General: Bowel sounds are normal. There is no distension.  Musculoskeletal:     Right hip: No tenderness. Normal range of motion.     Left hip: Normal. No tenderness. Normal range of  motion.       Feet:  Feet:     Right foot:     Skin integrity: Callus present. No ulcer or blister.     Left foot:     Skin integrity: No ulcer, blister, skin breakdown or erythema.  Skin:    General: Skin is warm.  Neurological:     Mental Status: He is alert.     Motor: Weakness present.     Comments: He cannot lift of either leg against gravity       CBC:    BMET Recent Labs    02/14/20 0307 02/15/20 0756  NA 133* 133*  K 3.6 4.3  CL 105 104  CO2 18* 20*  GLUCOSE 85 123*  BUN 18 16  CREATININE 0.70 0.59*  CALCIUM 7.6* 7.3*     Liver Panel  No results for input(s): PROT, ALBUMIN, AST, ALT, ALKPHOS, BILITOT, BILIDIR, IBILI in the last 72 hours.     Sedimentation Rate No results for input(s): ESRSEDRATE in the last 72 hours. C-Reactive Protein Recent Labs    02/14/20 0307 02/15/20 0756  CRP 13.8* 14.6*    Micro Results: Recent Results (from the past 720 hour(s))  Resp Panel by RT-PCR (Flu A&B, Covid) Nasopharyngeal Swab     Status: Abnormal   Collection Time: Feb 03, 2020  1:42 PM   Specimen: Nasopharyngeal Swab; Nasopharyngeal(NP) swabs in vial transport medium  Result Value Ref Range Status   SARS Coronavirus 2 by RT PCR POSITIVE (A) NEGATIVE Final    Comment: RESULT CALLED TO, READ BACK BY AND VERIFIED WITH: SIMMS.M,RN @ 1547 ON 02/03/2020, [email or web address].P (NOTE) SARS-CoV-2 target nucleic acids are DETECTED.  The SARS-CoV-2 RNA is generally detectable in upper respiratory specimens during the acute phase of infection. Positive results are indicative of the presence of the identified virus, but do not rule out bacterial infection or co-infection with other pathogens not detected by the test. Clinical correlation with patient history and other diagnostic information is necessary to determine patient infection status. The expected result is Negative.  Fact Sheet for Patients: 03/25/2020  Fact Sheet for Healthcare  Providers: BloggerCourse.com  This test is not yet approved or cleared by the [employer] FDA and  has been authorized for detection and/or diagnosis of SARS-CoV-2 by FDA under an Emergency Use Authorization (EUA).  This EUA will remain in effect (meaning  this  test can be used) for the duration of  the COVID-19 declaration under Section 564(b)(1) of the Act, 21 U.S.C. section 360bbb-3(b)(1), unless the authorization is terminated or revoked sooner.     Influenza A by PCR NEGATIVE NEGATIVE Final   Influenza B by PCR NEGATIVE NEGATIVE Final    Comment: (NOTE) The Xpert Xpress SARS-CoV-2/FLU/RSV plus assay is intended as an aid in the diagnosis of influenza from Nasopharyngeal swab specimens and should not be used as a sole basis for treatment. Nasal washings and aspirates are unacceptable for Xpert Xpress SARS-CoV-2/FLU/RSV testing.  Fact Sheet for Patients: BloggerCourse.com  Fact Sheet for Healthcare Providers: Serious[employer]ker[employer]it  This test is not yet approved or cleared by the [place] FDA and has been authorized for detection and/or diagnosis of SARS-CoV-2 by FDA under an Emergency Use Authorization (EUA). This EUA will remain in effect (meaning this test can be used) for the duration of the COVID-19 declaration under Section 564(b)(1) of the Act, 21 U.S.C. section 360bbb-3(b)(1), unless the authorization is terminated or revoked.  Performed at [facility], [address] [place], [place] [address]   Blood Culture (routine x 2)     Status: None   Collection Time: Feb 21, 2020  4:11 PM   Specimen: BLOOD LEFT FOREARM  Result Value Ref Range Status   Specimen Description   Final    BLOOD LEFT FOREARM Performed at [facility], [address], [place], [place] [address]    Special Requests   Final    BOTTLES DRAWN AEROBIC AND ANAEROBIC Blood Culture adequate volume Performed at  [facility], [address] [place], [place] [address]    Culture   Final    NO GROWTH 5 DAYS Performed at [facility], [address], [place], [place] [address]    Report Status 01/31/2020 FINAL  Final  Blood Culture (routine x 2)     Status: None   Collection Time: 02/21/20  4:45 PM   Specimen: BLOOD RIGHT HAND  Result Value Ref Range Status   Specimen Description   Final    BLOOD RIGHT HAND Performed at [facility], [address] [place], [place] [address]    Special Requests   Final    BOTTLES DRAWN AEROBIC AND ANAEROBIC Blood Culture adequate volume Performed at [facility], [address] [place], [place] [address]    Culture   Final    NO GROWTH 5 DAYS Performed at [facility], [address] [place], [place] [address]    Report Status 01/31/2020 FINAL  Final  Culture, blood (Routine X 2) w Reflex to ID Panel     Status: Abnormal   Collection Time: 02/09/20  8:10 AM   Specimen: BLOOD LEFT HAND  Result Value Ref Range Status   Specimen Description BLOOD LEFT HAND  Final   Special Requests   Final    BOTTLES DRAWN AEROBIC ONLY Blood Culture results may not be optimal due to an inadequate volume of blood received in culture bottles   Culture  Setup Time   Final    GRAM POSITIVE COCCI IN CLUSTERS AEROBIC BOTTLE ONLY CRITICAL VALUE NOTED.  VALUE IS CONSISTENT WITH PREVIOUSLY REPORTED AND CALLED VALUE.    Culture (A)  Final    STAPHYLOCOCCUS AUREUS SUSCEPTIBILITIES PERFORMED ON PREVIOUS CULTURE WITHIN THE LAST 5 DAYS. Performed at [facility], [address] [place], [place] [address]  Report Status 02/11/2020 FINAL  Final  Culture, blood (Routine X 2) w Reflex to ID Panel     Status: Abnormal   Collection Time: 02/09/20  8:22 AM   Specimen: BLOOD RIGHT HAND  Result Value Ref Range Status   Specimen Description BLOOD RIGHT HAND  Final   Special Requests   Final    BOTTLES DRAWN AEROBIC AND ANAEROBIC Blood  Culture results may not be optimal due to an inadequate volume of blood received in culture bottles   Culture  Setup Time   Final    GRAM POSITIVE COCCI IN CLUSTERS IN BOTH AEROBIC AND ANAEROBIC BOTTLES Organism ID to follow CRITICAL RESULT CALLED TO, READ BACK BY AND VERIFIED WITH: C AMEND [facility] 02/09/20 2216 JDW Performed at [facility], [address] [place], [place] [address]    Culture STAPHYLOCOCCUS AUREUS (A)  Final   Report Status 02/11/2020 FINAL  Final   Organism ID, Bacteria STAPHYLOCOCCUS AUREUS  Final      Susceptibility   Staphylococcus aureus - MIC*    CIPROFLOXACIN <=0.5 SENSITIVE Sensitive     ERYTHROMYCIN <=0.25 SENSITIVE Sensitive     GENTAMICIN <=0.5 SENSITIVE Sensitive     OXACILLIN 0.5 SENSITIVE Sensitive     TETRACYCLINE <=1 SENSITIVE Sensitive     VANCOMYCIN 1 SENSITIVE Sensitive     TRIMETH/SULFA <=10 SENSITIVE Sensitive     CLINDAMYCIN <=0.25 SENSITIVE Sensitive     RIFAMPIN <=0.5 SENSITIVE Sensitive     Inducible Clindamycin NEGATIVE Sensitive     * STAPHYLOCOCCUS AUREUS  Blood Culture ID Panel (Reflexed)     Status: Abnormal   Collection Time: 02/09/20  8:22 AM  Result Value Ref Range Status   Enterococcus faecalis NOT DETECTED NOT DETECTED Final   Enterococcus Faecium NOT DETECTED NOT DETECTED Final   Listeria monocytogenes NOT DETECTED NOT DETECTED Final   Staphylococcus species DETECTED (A) NOT DETECTED Final    Comment: CRITICAL RESULT CALLED TO, READ BACK BY AND VERIFIED WITH: C AMEND PHARMD 02/09/20 2216 JDW    Staphylococcus aureus (BCID) DETECTED (A) NOT DETECTED Final    Comment: CRITICAL RESULT CALLED TO, READ BACK BY AND VERIFIED WITH: C AMEND PHARMD 02/09/20 2216 JDW    Staphylococcus epidermidis NOT DETECTED NOT DETECTED Final   Staphylococcus lugdunensis NOT DETECTED NOT DETECTED Final   Streptococcus species NOT DETECTED NOT DETECTED Final   Streptococcus agalactiae NOT DETECTED NOT DETECTED Final   Streptococcus pneumoniae  NOT DETECTED NOT DETECTED Final   Streptococcus pyogenes NOT DETECTED NOT DETECTED Final   A.calcoaceticus-baumannii NOT DETECTED NOT DETECTED Final   Bacteroides fragilis NOT DETECTED NOT DETECTED Final   Enterobacterales NOT DETECTED NOT DETECTED Final   Enterobacter cloacae complex NOT DETECTED NOT DETECTED Final   Escherichia coli NOT DETECTED NOT DETECTED Final   Klebsiella aerogenes NOT DETECTED NOT DETECTED Final   Klebsiella oxytoca NOT DETECTED NOT DETECTED Final   Klebsiella pneumoniae NOT DETECTED NOT DETECTED Final   Proteus species NOT DETECTED NOT DETECTED Final   Salmonella species NOT DETECTED NOT DETECTED Final   Serratia marcescens NOT DETECTED NOT DETECTED Final   Haemophilus influenzae NOT DETECTED NOT DETECTED Final   Neisseria meningitidis NOT DETECTED NOT DETECTED Final   Pseudomonas aeruginosa NOT DETECTED NOT DETECTED Final   Stenotrophomonas maltophilia NOT DETECTED NOT DETECTED Final   Candida albicans NOT DETECTED NOT DETECTED Final   Candida auris NOT DETECTED NOT DETECTED Final   Candida glabrata NOT DETECTED NOT DETECTED Final   Candida krusei  NOT DETECTED NOT DETECTED Final   Candida parapsilosis NOT DETECTED NOT DETECTED Final   Candida tropicalis NOT DETECTED NOT DETECTED Final   Cryptococcus neoformans/gattii NOT DETECTED NOT DETECTED Final   Meth resistant mecA/C and MREJ NOT DETECTED NOT DETECTED Final    Comment: Performed at [facility], [address], [place], [place] [address]  MRSA PCR Screening     Status: None   Collection Time: 02/09/20  9:45 AM   Specimen: Nasal Mucosa; Nasopharyngeal  Result Value Ref Range Status   MRSA by PCR NEGATIVE NEGATIVE Final    Comment:        The GeneXpert MRSA Assay (FDA approved for NASAL specimens only), is one component of a comprehensive MRSA colonization surveillance program. It is not intended to diagnose MRSA infection nor to guide or monitor treatment for MRSA infections. Performed at  [facility], [address], [place], [place] [address]   Culture, blood (Routine X 2) w Reflex to ID Panel     Status: Abnormal   Collection Time: 02/10/20  3:30 AM   Specimen: BLOOD LEFT HAND  Result Value Ref Range Status   Specimen Description BLOOD LEFT HAND  Final   Special Requests   Final    AEROBIC BOTTLE ONLY Blood Culture results may not be optimal due to an inadequate volume of blood received in culture bottles   Culture  Setup Time   Final    GRAM POSITIVE COCCI IN CLUSTERS AEROBIC BOTTLE ONLY CRITICAL VALUE NOTED.  VALUE IS CONSISTENT WITH PREVIOUSLY REPORTED AND CALLED VALUE.    Culture (A)  Final    STAPHYLOCOCCUS CAPITIS THE SIGNIFICANCE OF ISOLATING THIS ORGANISM FROM A SINGLE SET OF BLOOD CULTURES WHEN MULTIPLE SETS ARE DRAWN IS UNCERTAIN. PLEASE NOTIFY THE MICROBIOLOGY DEPARTMENT WITHIN ONE WEEK IF SPECIATION AND SENSITIVITIES ARE REQUIRED. Performed at [facility], [address] [place], [place] [address]    Report Status 02/13/2020 FINAL  Final  Culture, blood (Routine X 2) w Reflex to ID Panel     Status: None (Preliminary result)   Collection Time: 02/10/20  6:37 PM   Specimen: BLOOD  Result Value Ref Range Status   Specimen Description BLOOD SITE NOT SPECIFIED  Final   Special Requests   Final    BOTTLES DRAWN AEROBIC AND ANAEROBIC Blood Culture adequate volume   Culture   Final    NO GROWTH 4 DAYS Performed at [facility], [address] [place], [place] [address]    Report Status PENDING  Incomplete  Culture, blood (Routine X 2) w Reflex to ID Panel     Status: None (Preliminary result)   Collection Time: 02/13/20  9:23 AM   Specimen: BLOOD  Result Value Ref Range Status   Specimen Description BLOOD RIGHT ANTECUBITAL  Final   Special Requests   Final    BOTTLES DRAWN AEROBIC ONLY Blood Culture results may not be optimal due to an inadequate volume of blood received in culture bottles   Culture   Final    NO GROWTH 1  DAY Performed at [facility], [address], [place], [place] [address]    Report Status PENDING  Incomplete  Culture, blood (Routine X 2) w Reflex to ID Panel     Status: None (Preliminary result)   Collection Time: 02/13/20  1:24 PM   Specimen: BLOOD RIGHT HAND  Result Value Ref Range Status   Specimen Description BLOOD RIGHT HAND  Final   Special Requests   Final    BOTTLES DRAWN AEROBIC AND ANAEROBIC Blood Culture adequate volume   Culture   Final    NO GROWTH < 24 HOURS Performed at [facility], [address] [place], [place] [address]    Report Status PENDING  Incomplete    Studies/Results: MR Lumbar Spine W Wo Contrast  Result Date: 02/15/2020 CLINICAL DATA:  Initial evaluation for back pain, epidural abscess suspected. EXAM: MRI LUMBAR SPINE WITHOUT AND WITH CONTRAST TECHNIQUE: Multiplanar and multiecho pulse sequences of the lumbar spine were obtained without and with intravenous contrast. CONTRAST:  7.65mL GADAVIST GADOBUTROL 1 MMOL/ML IV SOLN COMPARISON:  Prior MRI from 08/02/2015. FINDINGS: Segmentation: Standard. Lowest well-formed disc space labeled the L5-S1 level. 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 Additional trace 2 mm anterolisthesis of L4 on L5. 3 mm retrolisthesis of L1 on L2. Vertebrae: Mild chronic anterior wedging deformity noted at the superior endplate of T11. Vertebral body height otherwise maintained without acute or subacute fracture. Underlying bone marrow signal intensity within normal limits. No discrete or worrisome osseous lesions. No abnormal marrow edema or enhancement. No findings to suggest osteomyelitis discitis. There is a prominent joint effusion involving the right L4-5 facet (series 11, image 26). Prominent surrounding soft tissue edema and enhancement. Constellation of findings highly suspicious for acute septic arthritis with associated myositis/soft tissue infection.  Several T2 hyperintense irregular rim enhancing collections seen within the posterior soft tissues immediately posterior to the right L4-5 facet, consistent with abscess. Largest of these collections measures 1.6 x 1.5 x 2.9 cm (series 10, image 3). An adjacent 1.9 cm collection appears to communicate with the right L4-5 facet itself (series 8, image 25). Small joint effusion with mild soft tissue edema about the contralateral left L4-5 facet as well, which could also be involved. Soft tissue signal intensity within the right posterior and dorsal epidural space likely reflects mild phlegmon (series 9, image 26). No frank epidural abscess at this time. Small joint effusions noted within the bilateral L3-4 facets as well without convincing evidence for septic arthritis. Conus medullaris and cauda equina: Conus extends to the L1 level. Conus and cauda equina appear normal. Paraspinal and other soft tissues: No other acute abnormality within the paraspinous soft tissues. Asymmetric atrophy noted within the left psoas muscle. Multifocal atheromatous irregularity noted throughout the visualized aorta. Visualized visceral structures otherwise unremarkable. Disc levels: L1-2: Retrolisthesis. Diffuse disc bulge with disc desiccation and intervertebral disc space narrowing. Disc bulging eccentric to the left. Mild facet hypertrophy. Mild narrowing of the left lateral recess without significant spinal stenosis. Mild left L1 foraminal narrowing. Right neural foramen remains patent. L2-3: Mild disc bulge with disc desiccation. Mild facet hypertrophy. No significant spinal stenosis. Foramina remain patent. L3-4: Mild disc bulge with disc desiccation. Mild to moderate bilateral facet hypertrophy with associated trace joint effusions. No significant spinal stenosis. Foramina remain patent. L4-5: Trace anterolisthesis. Mild disc bulge with disc desiccation. Moderate bilateral facet hypertrophy with associated joint effusions.  Findings of septic arthritis on the right. No significant spinal stenosis. Mild bilateral L4 foraminal narrowing. 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 Degenerative intervertebral disc space narrowing with disc desiccation and broad posterior pseudo disc bulge. Severe bilateral facet arthrosis no significant spinal stenosis. Severe right with moderate left L5 foraminal stenosis. IMPRESSION: 1. Findings consistent with acute septic arthritis involving the right L4-5 facet. Associated edema and enhancement  within the adjacent posterior paraspinous musculature consistent with associated soft tissue infection/myositis. Superimposed multifocal soft tissue abscesses measuring up to 2.9 cm as above. Trace epidural phlegmon without frank epidural abscess at this time. 2. 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 3. Additional mild multilevel noncompressive disc bulging elsewhere within the lumbar spine as above. No other high-grade stenosis or evidence for neural impingement. Electronically Signed   By: Rise Mu M.D.   On: 02/15/2020 03:45      Assessment/Plan:  INTERVAL HISTORY:   MRI + acute septic arthritis of right L4-5 facet, with myositis adjacent tot this, severe R foraminal stenosis  Principal Problem:   Acute hypoxemic respiratory failure due to COVID-19 [facility]) Active Problems:   COVID-19 virus infection   Type 2 diabetes mellitus with vascular disease (HCC)   Rheumatoid arthritis (HCC)   PAF (paroxysmal atrial fibrillation) (HCC)   CAD (coronary artery disease)   Pneumonia due to COVID-19 virus   Hyponatremia   Multifocal pneumonia   Acute respiratory distress   Acute respiratory failure with hypoxia (HCC)   MSSA bacteremia   Hypoxia   Thrombocytopenia (HCC)   Pressure injury of skin    Javier Beard is a 80 y.o. male with history  of rheumatoid arthritis, prior amputation of a toe, admission with COVID-pneumonia now found to have methicillin sensitive Staph aureus bacteremia.  He has developed thrombocytopenia possibly due to the beta-lactam is been changed from cefazolin to vancomycin  MRI L spine with + acute septic arthritis of right L4-5 facet, with myositis adjacent tot this, severe R foraminal stenosis   #1 Methicillin sensitive Staphylococcus aureus bacteremia:  Give the MRI findings I  Suspect he had infection brewign there that then seeded blood while he was an inpatient  Since his blood cultures from 02/10/2020 only grew a Staph capitis in 1/2 if other one is negative will consdier this proof of clearance and  PICC  When he comes out of isolation could do TEE but given he would not be a good operative candidate I do not think it is worthwhile  He will be treated for 6 weeks with IV antibiotics regardles  He is apparently being knee pain now as well and plain films are ordered all take a look at him again tomorrow if needed with MRI of the knees and/or get orthopedic surgeries help in getting an aspirate for cell count and differential and crystals   #2  Lower extremity weakness: Do not think his MRI findings explain this entirely at the stenosis should be chronic not acute that seen  #3 TTpenia  We have switched from cefazolin to vancomycin platelets are up slightly  4 rheumatoid arthritis:  Would NOT let him have Simponi when he leaves and make sure he has completed treatment for his infection FIRST.  Not have a problem with his methotrexate per se  #5 pressure ulcer: Continue to monitor  #6 COVID: has been treated for this and is in airborne, contact though nearing 21 days of isolation   LOS: 19 days   Acey Lav 02/15/2020, 10:21 AM

## 2020-02-15 NOTE — Progress Notes (Signed)
PHARMACY CONSULT NOTE FOR:  OUTPATIENT  PARENTERAL ANTIBIOTIC THERAPY (OPAT)  Indication: MSSA septic arthritis and bacteremia Regimen: Vancomycin 1g q12hr End date: 03/21/20  IV antibiotic discharge orders are pended. To discharging provider:  please sign these orders via discharge navigator,  Select New Orders & click on the button choice - Manage This Unsigned Work.     Thank you for allowing pharmacy to be a part of this patient's care.  Margarite Gouge, PharmD PGY2 ID Pharmacy Resident [phone]  02/15/2020, 2:52 PM

## 2020-02-15 NOTE — Progress Notes (Signed)
Notified patient's daughter, Philipp Deputy, at number listed to notify her of patient's transfer out of ICU. Ms. Earlene Plater was updated on patient's status and all questions were answered. Ms. Earlene Plater thanked me and all staff for the care provided to her father. Patient then transferred to [phone] with all personal belongings in room.

## 2020-02-16 ENCOUNTER — Inpatient Hospital Stay (HOSPITAL_COMMUNITY): Payer: Medicare Other

## 2020-02-16 DIAGNOSIS — I480 Paroxysmal atrial fibrillation: Secondary | ICD-10-CM | POA: Diagnosis not present

## 2020-02-16 DIAGNOSIS — U071 COVID-19: Secondary | ICD-10-CM | POA: Diagnosis not present

## 2020-02-16 DIAGNOSIS — I2510 Atherosclerotic heart disease of native coronary artery without angina pectoris: Secondary | ICD-10-CM | POA: Diagnosis not present

## 2020-02-16 DIAGNOSIS — J189 Pneumonia, unspecified organism: Secondary | ICD-10-CM | POA: Diagnosis not present

## 2020-02-16 LAB — HEPATIC FUNCTION PANEL
ALT: 17 U/L (ref 0–44)
AST: 29 U/L (ref 15–41)
Albumin: 1.7 g/dL — ABNORMAL LOW (ref 3.5–5.0)
Alkaline Phosphatase: 52 U/L (ref 38–126)
Bilirubin, Direct: 0.5 mg/dL — ABNORMAL HIGH (ref 0.0–0.2)
Indirect Bilirubin: 1 mg/dL — ABNORMAL HIGH (ref 0.3–0.9)
Total Bilirubin: 1.5 mg/dL — ABNORMAL HIGH (ref 0.3–1.2)
Total Protein: 6 g/dL — ABNORMAL LOW (ref 6.5–8.1)

## 2020-02-16 LAB — GLUCOSE, CAPILLARY
Glucose-Capillary: 111 mg/dL — ABNORMAL HIGH (ref 70–99)
Glucose-Capillary: 118 mg/dL — ABNORMAL HIGH (ref 70–99)
Glucose-Capillary: 111 mg/dL — ABNORMAL HIGH (ref 70–99)
Glucose-Capillary: 101 mg/dL — ABNORMAL HIGH (ref 70–99)

## 2020-02-16 LAB — CBC
HCT: 32.6 % — ABNORMAL LOW (ref 39.0–52.0)
Hemoglobin: 11.3 g/dL — ABNORMAL LOW (ref 13.0–17.0)
MCH: 31.5 pg (ref 26.0–34.0)
MCHC: 34.7 g/dL (ref 30.0–36.0)
MCV: 90.8 fL (ref 80.0–100.0)
Platelets: 80 10*3/uL — ABNORMAL LOW (ref 150–400)
RBC: 3.59 MIL/uL — ABNORMAL LOW (ref 4.22–5.81)
RDW: 15.9 % — ABNORMAL HIGH (ref 11.5–15.5)
WBC: 7.3 10*3/uL (ref 4.0–10.5)
nRBC: 0 % (ref 0.0–0.2)

## 2020-02-16 LAB — BASIC METABOLIC PANEL
Anion gap: 9 (ref 5–15)
BUN: 13 mg/dL (ref 8–23)
CO2: 20 mmol/L — ABNORMAL LOW (ref 22–32)
Calcium: 7.5 mg/dL — ABNORMAL LOW (ref 8.9–10.3)
Chloride: 102 mmol/L (ref 98–111)
Creatinine, Ser: 0.53 mg/dL — ABNORMAL LOW (ref 0.61–1.24)
GFR, Estimated: >60 mL/min (ref >60)
Glucose, Bld: 84 mg/dL (ref 70–99)
Potassium: 4.1 mmol/L (ref 3.5–5.1)
Sodium: 131 mmol/L — ABNORMAL LOW (ref 135–145)

## 2020-02-16 LAB — LACTIC ACID, PLASMA: Lactic Acid, Venous: 1.9 mmol/L (ref 0.5–1.9)

## 2020-02-16 LAB — VANCOMYCIN, TROUGH: Vancomycin Tr: 18 ug/mL (ref 15–20)

## 2020-02-16 LAB — MAGNESIUM: Magnesium: 1.9 mg/dL (ref 1.7–2.4)

## 2020-02-16 LAB — LIPASE, BLOOD: Lipase: 89 U/L — ABNORMAL HIGH (ref 11–51)

## 2020-02-16 LAB — C-REACTIVE PROTEIN: CRP: 14.3 mg/dL — ABNORMAL HIGH (ref <1.0)

## 2020-02-16 IMAGING — DX DG ABD PORTABLE 1V
1 series · 2 of 2 positions shown · non-contrast
Comparison: None.

CLINICAL DATA: Abdominal pain

EXAM:
PORTABLE ABDOMEN - 1 VIEW

[Series 1: abdomen · 0.14mm/px · 2 of 2 slices shown]
[im 1/2]
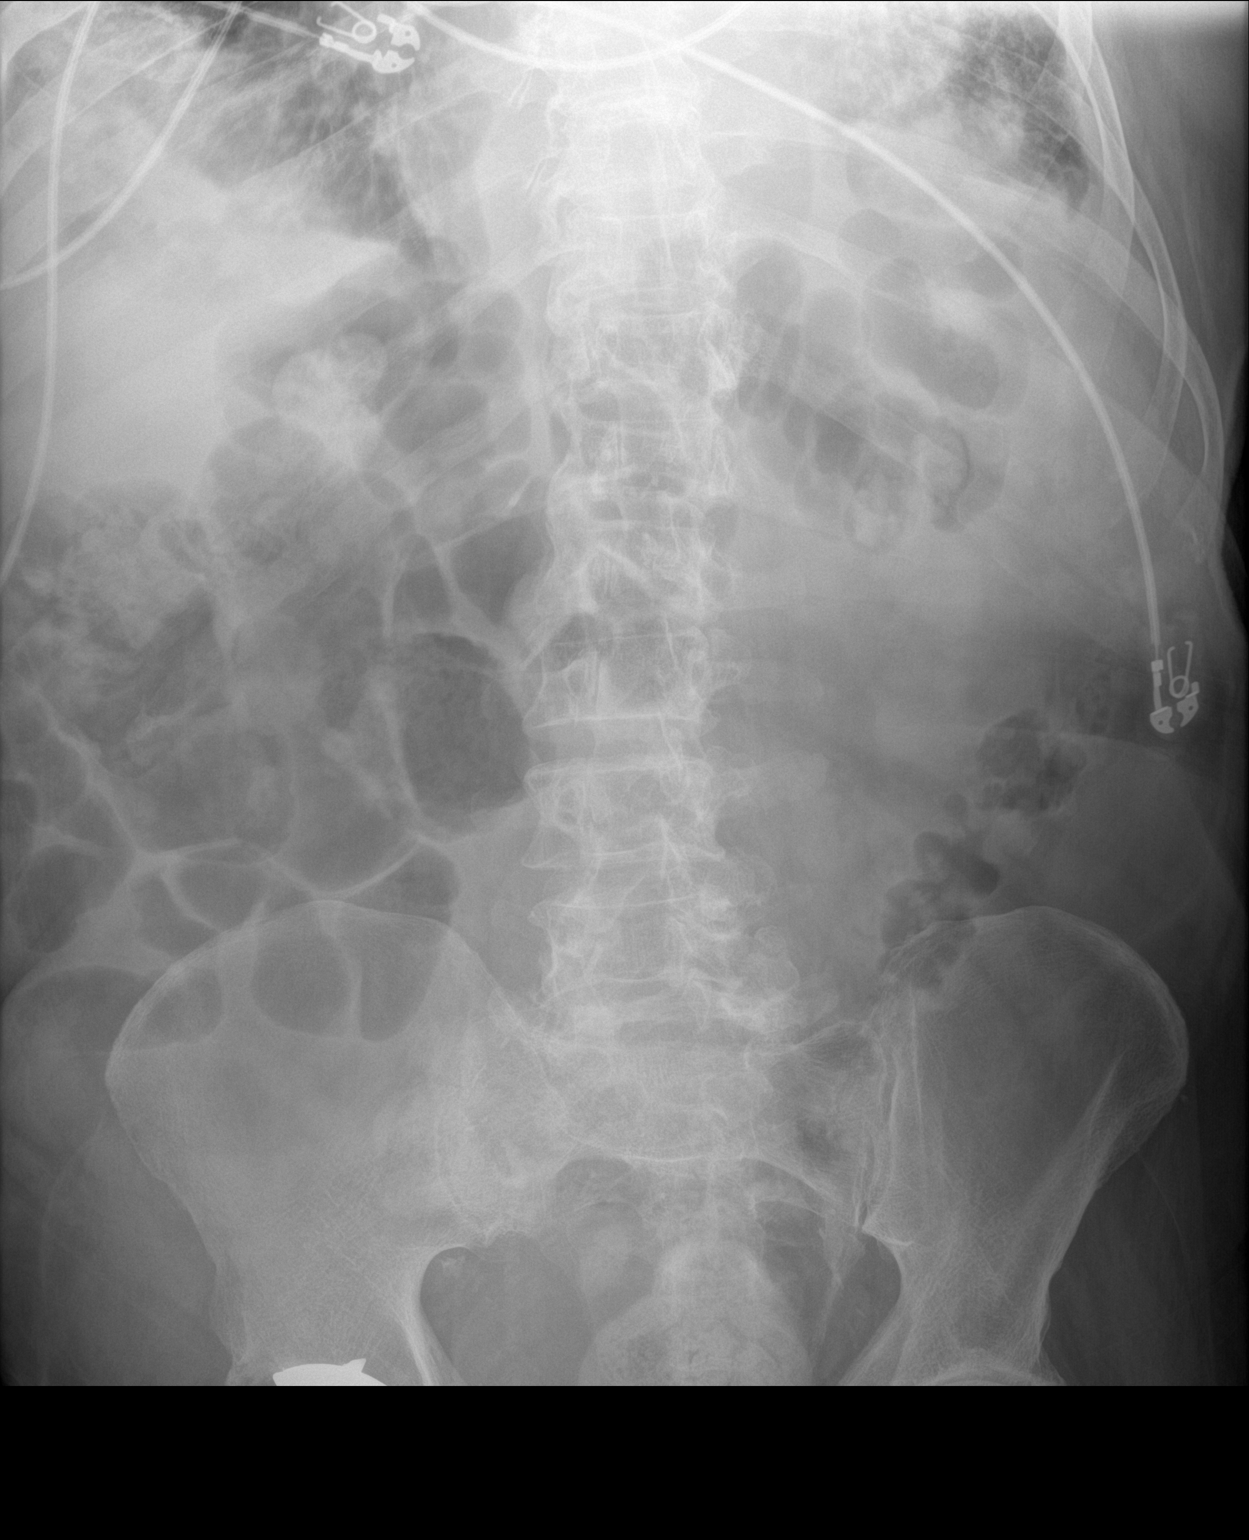
[im 2/2]
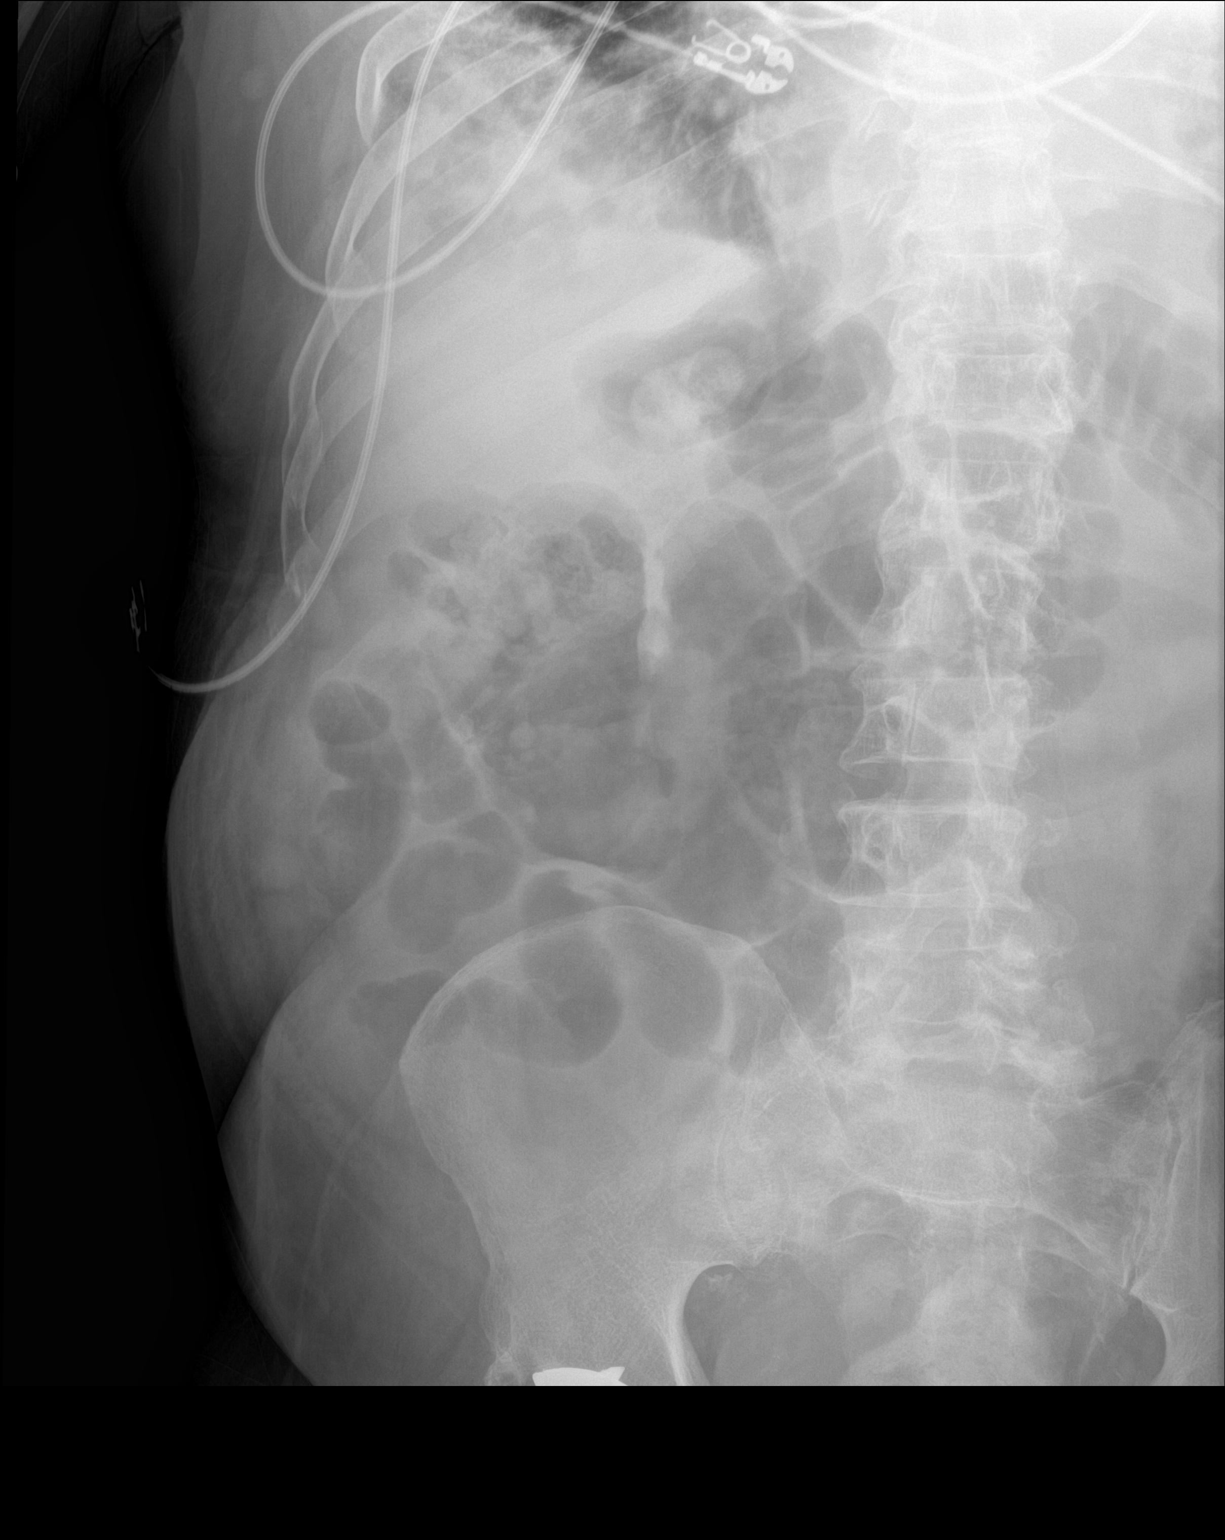

[2 of 2 positions shown; findings below may reference images not displayed]

FINDINGS: There is moderate stool in the colon. There is no bowel dilatation
or air-fluid level to suggest bowel obstruction. No free air. Total
hip replacement on the right. Moderate narrowing left hip joint.
Infiltrate noted in the lung bases.
IMPRESSION: Moderate stool in colon. No bowel obstruction or free air evident.
Arthropathy left hip joint. Infiltrate in lung bases.

## 2020-02-16 IMAGING — DX DG CHEST 1V PORT
1 series · 1 of 1 positions shown · non-contrast
Comparison: [DATE]

CLINICAL DATA: Central catheter placement. Reported [L4]
positive

EXAM:
PORTABLE CHEST 1 VIEW

[chest]
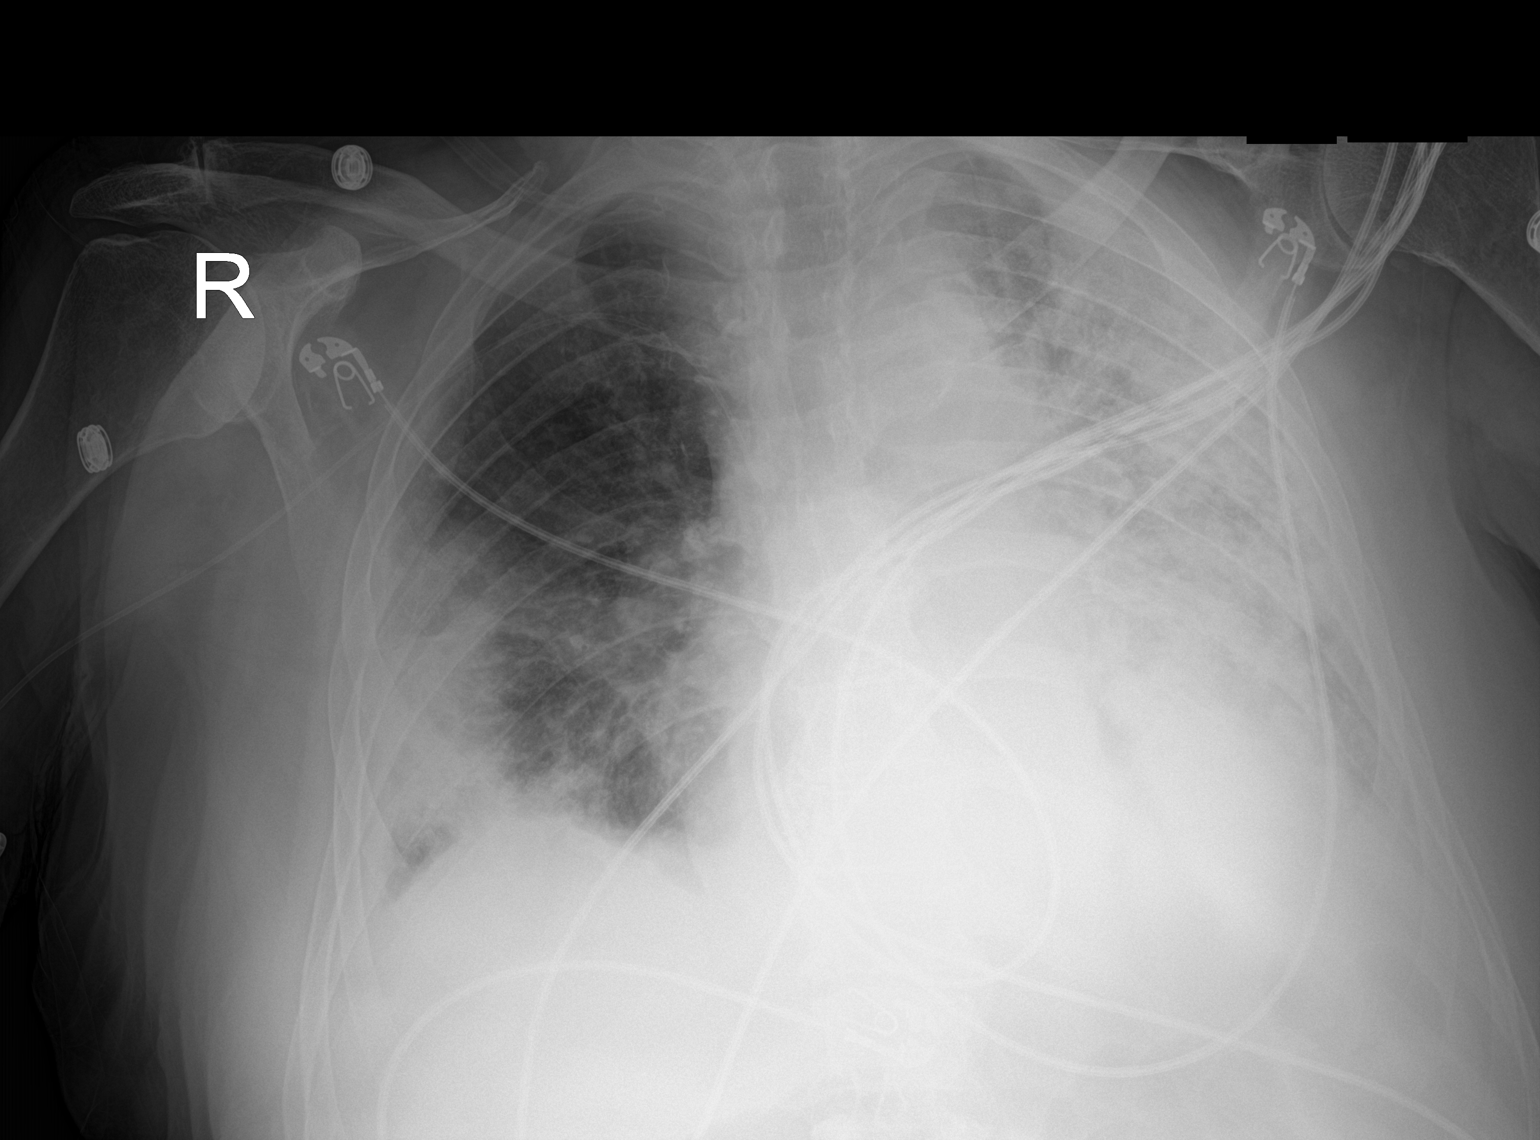

[1 of 1 positions shown; findings below may reference images not displayed]

FINDINGS: Central catheter tip is in the superior vena cava. No pneumothorax.
Extensive airspace opacity consistent with multifocal pneumonia is
seen throughout the left lung. There is airspace opacity with
consolidation in portions of the right mid and lower lung regions.
Heart is mildly enlarged with pulmonary vascularity normal. No
adenopathy. No bone lesions.
IMPRESSION: Central catheter tip in superior vena cava without pneumothorax.
Widespread multifocal airspace opacity is again noted, more
extensive on the left than on the right, consistent with multifocal
pneumonia. Suspect atypical organism pneumonia, although there may
well be bacterial superinfection in areas of superimposed
consolidation. Stable cardiac prominence.

## 2020-02-16 MED ORDER — BOOST / RESOURCE BREEZE PO LIQD CUSTOM
1.0000 | Freq: Four times a day (QID) | ORAL | Status: DC
  Administered 2020-02-17 – 2020-02-20 (×10): 1 via ORAL

## 2020-02-16 MED ORDER — SODIUM CHLORIDE 0.9% FLUSH
10.0000 mL | INTRAVENOUS | Status: DC | PRN
Start: 2020-02-16 — End: 2020-02-23
  Administered 2020-02-22: 10 mL

## 2020-02-16 MED ORDER — LACTULOSE 10 GM/15ML PO SOLN
20.0000 g | Freq: Two times a day (BID) | ORAL | Status: DC
  Administered 2020-02-16 – 2020-02-20 (×6): 20 g via ORAL
  Filled 2020-02-16 (×8): qty 30

## 2020-02-16 NOTE — TOC Progression Note (Signed)
Transition of Care [facility]) - Progression Note    Patient Details  Name: EZECHIEL STOOKSBURY MRN: [id number] Date of Birth: [birth year]  [facility] CM/SW Contact  Mearl Latin, LCSW Phone Number: 02/16/2020, 9:35 AM  Clinical Narrative:    CSW received call from patient's daughter. She is requesting CSW get in contact with [facility] again now that patient is out of ICU as he will refuse to go to SNF. CSW made her aware that he will be coming off of covid precautions soon and perhaps having a family visitor will help encourage him to work with therapy. She stated that he prefers for people to schedule sessions with him; CSW will pass on to therapy. CSW contacted [facility], Ann, and she requested updated referral, which CSW faxed. She will hopefully be able to admit patient next week.    Expected Discharge Plan: Home w Home Health Services Barriers to Discharge: Continued Medical Work up  Expected Discharge Plan and Services Expected Discharge Plan: Home w Home Health Services In-house Referral: Clinical Social Work Discharge Planning Services: CM Consult Post Acute Care Choice: Home Health Living arrangements for the past 2 months: Single Family Home Expected Discharge Date: 02/01/20                                     Social Determinants of Health (SDOH) Interventions    Readmission Risk Interventions No flowsheet data found.

## 2020-02-16 NOTE — Progress Notes (Addendum)
Subjective:  NO NEW Compllaints  Antibiotics:  Anti-infectives (From admission, onward)   Start     Dose/Rate Route Frequency Ordered Stop   02/14/20 0600  vancomycin (VANCOCIN) IVPB 1000 mg/200 mL premix        1,000 mg 200 mL/hr over 60 Minutes Intravenous Every 12 hours 02/13/20 1348     02/13/20 1600  vancomycin (VANCOREADY) IVPB 1750 mg/350 mL        1,750 mg 175 mL/hr over 120 Minutes Intravenous  Once 02/13/20 1348 02/13/20 1924   02/09/20 2300  ceFAZolin (ANCEF) IVPB 2g/100 mL premix  Status:  Discontinued        2 g 200 mL/hr over 30 Minutes Intravenous Every 8 hours 02/09/20 2233 02/13/20 1349   02/09/20 1000  ceFEPIme (MAXIPIME) 1 g in sodium chloride 0.9 % 100 mL IVPB  Status:  Discontinued        1 g 200 mL/hr over 30 Minutes Intravenous Every 12 hours 02/09/20 0846 02/09/20 2233   02/09/20 1000  linezolid (ZYVOX) IVPB 600 mg  Status:  Discontinued        600 mg 300 mL/hr over 60 Minutes Intravenous Every 12 hours 02/09/20 0846 02/09/20 2233   01/27/20 1000  remdesivir 100 mg in sodium chloride 0.9 % 100 mL IVPB        100 mg 200 mL/hr over 30 Minutes Intravenous Daily 01/25/2020 1602 01/30/20 1418   01/27/20 1000  hydroxychloroquine (PLAQUENIL) tablet 200 mg  Status:  Discontinued        200 mg Oral 2 times daily 01/27/20 0358 02/13/20 1155   01/25/2020 1630  remdesivir 100 mg in sodium chloride 0.9 % 100 mL IVPB        100 mg 200 mL/hr over 30 Minutes Intravenous Every 30 min 02/11/2020 1602 02/09/2020 1745      Medications: Scheduled Meds: . (feeding supplement) PROSource Plus  30 mL Oral TID WC  . apixaban  5 mg Oral BID  . vitamin C  500 mg Oral Daily  . calcium carbonate  1,250 mg Oral Q breakfast  . calcium carbonate  0.5 tablet Oral Q supper  . chlorhexidine  15 mL Mouth Rinse BID  . Chlorhexidine Gluconate Cloth  6 each Topical Daily  . docusate sodium  100 mg Oral BID  . feeding supplement  1 Container Oral TID BM  . folic acid  1 mg Oral Daily   . gabapentin  100 mg Oral QHS  . insulin aspart  0-9 Units Subcutaneous TID WC  . lidocaine  1 patch Transdermal Q24H  . mouth rinse  15 mL Mouth Rinse BID  . pantoprazole  40 mg Oral Daily  . polyethylene glycol  17 g Oral Daily  . pravastatin  80 mg Oral q1800  . predniSONE  20 mg Oral Q breakfast   Followed by  . [START ON 02/18/2020] predniSONE  10 mg Oral Q breakfast   Followed by  . [START ON 02/23/2020] predniSONE  5 mg Oral Q breakfast  . vitamin B-12  1,000 mcg Oral Daily  . zinc sulfate  220 mg Oral Daily   Continuous Infusions: . sodium chloride Stopped (02/14/20 1855)  . vancomycin 1,000 mg (02/16/20 0539)   PRN Meds:.acetaminophen **OR** acetaminophen, methocarbamol, morphine injection, oxyCODONE, senna-docusate, sodium chloride flush    Objective: Weight change:   Intake/Output Summary (Last 24 hours) at 02/16/2020 1304 Last data filed at 02/16/2020 0900 Gross per 24 hour  Intake  0 ml  Output 750 ml  Net -750 ml   Blood pressure 116/66, pulse 85, temperature 97.8 F (36.6 C), temperature source Axillary, resp. rate (!) 25, height '5\' 6"'  (1.676 m), weight 79.6 kg, SpO2 99 %. Temp:  [97.8 F (36.6 C)-98.8 F (37.1 C)] 97.8 F (36.6 C) (01/28 1145) Pulse Rate:  [82-99] 85 (01/28 1145) Resp:  [20-27] 25 (01/28 1145) BP: (107-148)/(62-77) 116/66 (01/28 1145) SpO2:  [91 %-100 %] 99 % (01/28 1145)  Patient getting PICC placed  Physical Exam: Physical Exam Constitutional:      Appearance: He is well-developed and well-nourished.  HENT:     Head: Normocephalic and atraumatic.  Eyes:     Extraocular Movements: EOM normal.     Conjunctiva/sclera: Conjunctivae normal.  Cardiovascular:     Rate and Rhythm: Normal rate. Rhythm irregular.  Pulmonary:     Effort: Pulmonary effort is normal. No respiratory distress.     Breath sounds: No wheezing.  Abdominal:     General: There is no distension.     Palpations: Abdomen is soft.  Musculoskeletal:         General: No edema. Normal range of motion.     Cervical back: Normal range of motion and neck supple.  Feet:     Right foot:     Skin integrity: Callus present. No ulcer or blister.     Left foot:     Skin integrity: No ulcer, blister, skin breakdown or erythema.  Skin:    General: Skin is warm and dry.     Findings: No erythema or rash.  Neurological:     General: No focal deficit present.     Mental Status: He is alert and oriented to person, place, and time.  Psychiatric:        Mood and Affect: Mood and affect and mood normal.        Behavior: Behavior normal.        Thought Content: Thought content normal.       CBC:    BMET Recent Labs    02/15/20 0756 02/16/20 0204  NA 133* 131*  K 4.3 4.1  CL 104 102  CO2 20* 20*  GLUCOSE 123* 84  BUN 16 13  CREATININE 0.59* 0.53*  CALCIUM 7.3* 7.5*     Liver Panel  No results for input(s): PROT, ALBUMIN, AST, ALT, ALKPHOS, BILITOT, BILIDIR, IBILI in the last 72 hours.     Sedimentation Rate No results for input(s): ESRSEDRATE in the last 72 hours. C-Reactive Protein Recent Labs    02/15/20 0756 02/16/20 0204  CRP 14.6* 14.3*    Micro Results: Recent Results (from the past 720 hour(s))  Resp Panel by RT-PCR (Flu A&B, Covid) Nasopharyngeal Swab     Status: Abnormal   Collection Time: 02/11/2020  1:42 PM   Specimen: Nasopharyngeal Swab; Nasopharyngeal(NP) swabs in vial transport medium  Result Value Ref Range Status   SARS Coronavirus 2 by RT PCR POSITIVE (A) NEGATIVE Final    Comment: RESULT CALLED TO, READ BACK BY AND VERIFIED WITH: SIMMS.M,RN @ 1547 ON 02/16/2020, CABELLERO.P (NOTE) SARS-CoV-2 target nucleic acids are DETECTED.  The SARS-CoV-2 RNA is generally detectable in upper respiratory specimens during the acute phase of infection. Positive results are indicative of the presence of the identified virus, but do not rule out bacterial infection or co-infection with other pathogens not detected by  the test. Clinical correlation with patient history and other diagnostic information is necessary to determine patient infection  status. The expected result is Negative.  Fact Sheet for Patients: EntrepreneurPulse.com.au  Fact Sheet for Healthcare Providers: IncredibleEmployment.be  This test is not yet approved or cleared by the [place] FDA and  has been authorized for detection and/or diagnosis of SARS-CoV-2 by FDA under an Emergency Use Authorization (EUA).  This EUA will remain in effect (meaning this  test can be used) for the duration of  the COVID-19 declaration under Section 564(b)(1) of the Act, 21 U.S.C. section 360bbb-3(b)(1), unless the authorization is terminated or revoked sooner.     Influenza A by PCR NEGATIVE NEGATIVE Final   Influenza B by PCR NEGATIVE NEGATIVE Final    Comment: (NOTE) The Xpert Xpress SARS-CoV-2/FLU/RSV plus assay is intended as an aid in the diagnosis of influenza from Nasopharyngeal swab specimens and should not be used as a sole basis for treatment. Nasal washings and aspirates are unacceptable for Xpert Xpress SARS-CoV-2/FLU/RSV testing.  Fact Sheet for Patients: EntrepreneurPulse.com.au  Fact Sheet for Healthcare Providers: IncredibleEmployment.be  This test is not yet approved or cleared by the [place] FDA and has been authorized for detection and/or diagnosis of SARS-CoV-2 by FDA under an Emergency Use Authorization (EUA). This EUA will remain in effect (meaning this test can be used) for the duration of the COVID-19 declaration under Section 564(b)(1) of the Act, 21 U.S.C. section 360bbb-3(b)(1), unless the authorization is terminated or revoked.  Performed at [facility], [address] [place], [place] [address]   Blood Culture (routine x 2)     Status: None   Collection Time: 01/27/2020  4:11 PM   Specimen: BLOOD LEFT FOREARM   Result Value Ref Range Status   Specimen Description   Final    BLOOD LEFT FOREARM Performed at [facility], [place] [address], [place], [place] [address]    Special Requests   Final    BOTTLES DRAWN AEROBIC AND ANAEROBIC Blood Culture adequate volume Performed at [facility], [address] [place], [place] [address]    Culture   Final    NO GROWTH 5 DAYS Performed at [facility], [place] [address], [place], [place] [address]    Report Status 01/31/2020 FINAL  Final  Blood Culture (routine x 2)     Status: None   Collection Time: 01/31/2020  4:45 PM   Specimen: BLOOD RIGHT HAND  Result Value Ref Range Status   Specimen Description   Final    BLOOD RIGHT HAND Performed at [facility], [place] [address], [place], [place] [address]    Special Requests   Final    BOTTLES DRAWN AEROBIC AND ANAEROBIC Blood Culture adequate volume Performed at [facility], [address] [place], [place] [address]    Culture   Final    NO GROWTH 5 DAYS Performed at [facility], [place] [address] [place], [place] [address]    Report Status 01/31/2020 FINAL  Final  Culture, blood (Routine X 2) w Reflex to ID Panel     Status: Abnormal   Collection Time: 02/09/20  8:10 AM   Specimen: BLOOD LEFT HAND  Result Value Ref Range Status   Specimen Description BLOOD LEFT HAND  Final   Special Requests   Final    BOTTLES DRAWN AEROBIC ONLY Blood Culture results may not be optimal due to an inadequate volume of blood received in culture bottles   Culture  Setup Time   Final  GRAM POSITIVE COCCI IN CLUSTERS AEROBIC BOTTLE ONLY CRITICAL VALUE NOTED.  VALUE IS CONSISTENT WITH PREVIOUSLY REPORTED AND CALLED VALUE.    Culture (A)  Final    STAPHYLOCOCCUS AUREUS SUSCEPTIBILITIES PERFORMED ON PREVIOUS CULTURE WITHIN THE LAST 5 DAYS. Performed at [facility], [place] [address], [place], [place] [address]    Report Status 02/11/2020 FINAL  Final  Culture, blood  (Routine X 2) w Reflex to ID Panel     Status: Abnormal   Collection Time: 02/09/20  8:22 AM   Specimen: BLOOD RIGHT HAND  Result Value Ref Range Status   Specimen Description BLOOD RIGHT HAND  Final   Special Requests   Final    BOTTLES DRAWN AEROBIC AND ANAEROBIC Blood Culture results may not be optimal due to an inadequate volume of blood received in culture bottles   Culture  Setup Time   Final    GRAM POSITIVE COCCI IN CLUSTERS IN BOTH AEROBIC AND ANAEROBIC BOTTLES Organism ID to follow CRITICAL RESULT CALLED TO, READ BACK BY AND VERIFIED WITH: C AMEND [facility] 02/09/20 2216 JDW Performed at [facility], [place] [address], [place], [place] [address]    Culture STAPHYLOCOCCUS AUREUS (A)  Final   Report Status 02/11/2020 FINAL  Final   Organism ID, Bacteria STAPHYLOCOCCUS AUREUS  Final      Susceptibility   Staphylococcus aureus - MIC*    CIPROFLOXACIN <=0.5 SENSITIVE Sensitive     ERYTHROMYCIN <=0.25 SENSITIVE Sensitive     GENTAMICIN <=0.5 SENSITIVE Sensitive     OXACILLIN 0.5 SENSITIVE Sensitive     TETRACYCLINE <=1 SENSITIVE Sensitive     VANCOMYCIN 1 SENSITIVE Sensitive     TRIMETH/SULFA <=10 SENSITIVE Sensitive     CLINDAMYCIN <=0.25 SENSITIVE Sensitive     RIFAMPIN <=0.5 SENSITIVE Sensitive     Inducible Clindamycin NEGATIVE Sensitive     * STAPHYLOCOCCUS AUREUS  Blood Culture ID Panel (Reflexed)     Status: Abnormal   Collection Time: 02/09/20  8:22 AM  Result Value Ref Range Status   Enterococcus faecalis NOT DETECTED NOT DETECTED Final   Enterococcus Faecium NOT DETECTED NOT DETECTED Final   Listeria monocytogenes NOT DETECTED NOT DETECTED Final   Staphylococcus species DETECTED (A) NOT DETECTED Final    Comment: CRITICAL RESULT CALLED TO, READ BACK BY AND VERIFIED WITH: C AMEND PHARMD 02/09/20 2216 JDW    Staphylococcus aureus (BCID) DETECTED (A) NOT DETECTED Final    Comment: CRITICAL RESULT CALLED TO, READ BACK BY AND VERIFIED WITH: C AMEND PHARMD 02/09/20  2216 JDW    Staphylococcus epidermidis NOT DETECTED NOT DETECTED Final   Staphylococcus lugdunensis NOT DETECTED NOT DETECTED Final   Streptococcus species NOT DETECTED NOT DETECTED Final   Streptococcus agalactiae NOT DETECTED NOT DETECTED Final   Streptococcus pneumoniae NOT DETECTED NOT DETECTED Final   Streptococcus pyogenes NOT DETECTED NOT DETECTED Final   A.calcoaceticus-baumannii NOT DETECTED NOT DETECTED Final   Bacteroides fragilis NOT DETECTED NOT DETECTED Final   Enterobacterales NOT DETECTED NOT DETECTED Final   Enterobacter cloacae complex NOT DETECTED NOT DETECTED Final   Escherichia coli NOT DETECTED NOT DETECTED Final   Klebsiella aerogenes NOT DETECTED NOT DETECTED Final   Klebsiella oxytoca NOT DETECTED NOT DETECTED Final   Klebsiella pneumoniae NOT DETECTED NOT DETECTED Final   Proteus species NOT DETECTED NOT DETECTED Final   Salmonella species NOT DETECTED NOT DETECTED Final   Serratia marcescens NOT DETECTED NOT DETECTED Final   Haemophilus influenzae NOT DETECTED NOT DETECTED  Final   Neisseria meningitidis NOT DETECTED NOT DETECTED Final   Pseudomonas aeruginosa NOT DETECTED NOT DETECTED Final   Stenotrophomonas maltophilia NOT DETECTED NOT DETECTED Final   Candida albicans NOT DETECTED NOT DETECTED Final   Candida auris NOT DETECTED NOT DETECTED Final   Candida glabrata NOT DETECTED NOT DETECTED Final   Candida krusei NOT DETECTED NOT DETECTED Final   Candida parapsilosis NOT DETECTED NOT DETECTED Final   Candida tropicalis NOT DETECTED NOT DETECTED Final   Cryptococcus neoformans/gattii NOT DETECTED NOT DETECTED Final   Meth resistant mecA/C and MREJ NOT DETECTED NOT DETECTED Final    Comment: Performed at [facility], [place] [address], [place], [place] [address]  MRSA PCR Screening     Status: None   Collection Time: 02/09/20  9:45 AM   Specimen: Nasal Mucosa; Nasopharyngeal  Result Value Ref Range Status   MRSA by PCR NEGATIVE NEGATIVE Final     Comment:        The GeneXpert MRSA Assay (FDA approved for NASAL specimens only), is one component of a comprehensive MRSA colonization surveillance program. It is not intended to diagnose MRSA infection nor to guide or monitor treatment for MRSA infections. Performed at [facility], [place] [address] [place], [place] [address]   Culture, blood (Routine X 2) w Reflex to ID Panel     Status: Abnormal   Collection Time: 02/10/20  3:30 AM   Specimen: BLOOD LEFT HAND  Result Value Ref Range Status   Specimen Description BLOOD LEFT HAND  Final   Special Requests   Final    AEROBIC BOTTLE ONLY Blood Culture results may not be optimal due to an inadequate volume of blood received in culture bottles   Culture  Setup Time   Final    GRAM POSITIVE COCCI IN CLUSTERS AEROBIC BOTTLE ONLY CRITICAL VALUE NOTED.  VALUE IS CONSISTENT WITH PREVIOUSLY REPORTED AND CALLED VALUE.    Culture (A)  Final    STAPHYLOCOCCUS CAPITIS THE SIGNIFICANCE OF ISOLATING THIS ORGANISM FROM A SINGLE SET OF BLOOD CULTURES WHEN MULTIPLE SETS ARE DRAWN IS UNCERTAIN. PLEASE NOTIFY THE MICROBIOLOGY DEPARTMENT WITHIN ONE WEEK IF SPECIATION AND SENSITIVITIES ARE REQUIRED. Performed at [facility], [place] [address], [place], [place] [address]    Report Status 02/13/2020 FINAL  Final  Culture, blood (Routine X 2) w Reflex to ID Panel     Status: None   Collection Time: 02/10/20  6:37 PM   Specimen: BLOOD  Result Value Ref Range Status   Specimen Description BLOOD SITE NOT SPECIFIED  Final   Special Requests   Final    BOTTLES DRAWN AEROBIC AND ANAEROBIC Blood Culture adequate volume   Culture   Final    NO GROWTH 5 DAYS Performed at [facility], [place] [address] [place], [place] [address]    Report Status 02/15/2020 FINAL  Final  Culture, blood (Routine X 2) w Reflex to ID Panel     Status: None (Preliminary result)   Collection Time: 02/13/20  9:23 AM   Specimen: BLOOD  Result Value Ref Range Status    Specimen Description BLOOD RIGHT ANTECUBITAL  Final   Special Requests   Final    BOTTLES DRAWN AEROBIC ONLY Blood Culture results may not be optimal due to an inadequate volume of blood received in culture bottles   Culture   Final    NO GROWTH 3 DAYS Performed at [facility], [place] [address],  [place], [place] [address]    Report Status PENDING  Incomplete  Culture, blood (Routine X 2) w Reflex to ID Panel     Status: None (Preliminary result)   Collection Time: 02/13/20  1:24 PM   Specimen: BLOOD RIGHT HAND  Result Value Ref Range Status   Specimen Description BLOOD RIGHT HAND  Final   Special Requests   Final    BOTTLES DRAWN AEROBIC AND ANAEROBIC Blood Culture adequate volume   Culture   Final    NO GROWTH 3 DAYS Performed at [facility], [place] [address], [place], [place] [address]    Report Status PENDING  Incomplete    Studies/Results: MR Lumbar Spine W Wo Contrast  Result Date: 02/15/2020 CLINICAL DATA:  Initial evaluation for back pain, epidural abscess suspected. EXAM: MRI LUMBAR SPINE WITHOUT AND WITH CONTRAST TECHNIQUE: Multiplanar and multiecho pulse sequences of the lumbar spine were obtained without and with intravenous contrast. CONTRAST:  7.11m GADAVIST GADOBUTROL 1 MMOL/ML IV SOLN COMPARISON:  Prior MRI from 08/02/2015. FINDINGS: Segmentation: Standard. Lowest well-formed disc space labeled the L5-S1 level. 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 Additional trace 2 mm anterolisthesis of L4 on L5. 3 mm retrolisthesis of L1 on L2. Vertebrae: Mild chronic anterior wedging deformity noted at the superior endplate of TC14 Vertebral body height otherwise maintained without acute or subacute fracture. Underlying bone marrow signal intensity within normal limits. No discrete or worrisome osseous lesions. No abnormal marrow edema or enhancement. No findings to suggest osteomyelitis discitis. There is a  prominent joint effusion involving the right L4-5 facet (series 11, image 26). Prominent surrounding soft tissue edema and enhancement. Constellation of findings highly suspicious for acute septic arthritis with associated myositis/soft tissue infection. Several T2 hyperintense irregular rim enhancing collections seen within the posterior soft tissues immediately posterior to the right L4-5 facet, consistent with abscess. Largest of these collections measures 1.6 x 1.5 x 2.9 cm (series 10, image 3). An adjacent 1.9 cm collection appears to communicate with the right L4-5 facet itself (series 8, image 25). Small joint effusion with mild soft tissue edema about the contralateral left L4-5 facet as well, which could also be involved. Soft tissue signal intensity within the right posterior and dorsal epidural space likely reflects mild phlegmon (series 9, image 26). No frank epidural abscess at this time. Small joint effusions noted within the bilateral L3-4 facets as well without convincing evidence for septic arthritis. Conus medullaris and cauda equina: Conus extends to the L1 level. Conus and cauda equina appear normal. Paraspinal and other soft tissues: No other acute abnormality within the paraspinous soft tissues. Asymmetric atrophy noted within the left psoas muscle. Multifocal atheromatous irregularity noted throughout the visualized aorta. Visualized visceral structures otherwise unremarkable. Disc levels: L1-2: Retrolisthesis. Diffuse disc bulge with disc desiccation and intervertebral disc space narrowing. Disc bulging eccentric to the left. Mild facet hypertrophy. Mild narrowing of the left lateral recess without significant spinal stenosis. Mild left L1 foraminal narrowing. Right neural foramen remains patent. L2-3: Mild disc bulge with disc desiccation. Mild facet hypertrophy. No significant spinal stenosis. Foramina remain patent. L3-4: Mild disc bulge with disc desiccation. Mild to moderate bilateral  facet hypertrophy with associated trace joint effusions. No significant spinal stenosis. Foramina remain patent. L4-5: Trace anterolisthesis. Mild disc bulge with disc desiccation. Moderate bilateral facet hypertrophy with associated joint effusions. Findings of septic arthritis on the right. No significant spinal stenosis. Mild bilateral L4 foraminal narrowing.  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 Degenerative intervertebral disc space narrowing with disc desiccation and broad posterior pseudo disc bulge. Severe bilateral facet arthrosis no significant spinal stenosis. Severe right with moderate left L5 foraminal stenosis. IMPRESSION: 1. Findings consistent with acute septic arthritis involving the right L4-5 facet. Associated edema and enhancement within the adjacent posterior paraspinous musculature consistent with associated soft tissue infection/myositis. Superimposed multifocal soft tissue abscesses measuring up to 2.9 cm as above. Trace epidural phlegmon without frank epidural abscess at this time. 2. 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 3. Additional mild multilevel noncompressive disc bulging elsewhere within the lumbar spine as above. No other high-grade stenosis or evidence for neural impingement. Electronically Signed   By: Jeannine Boga M.D.   On: 02/15/2020 03:45   DG CHEST PORT 1 VIEW  Result Date: 02/16/2020 CLINICAL DATA:  Central catheter placement. Reported COVID-19 positive EXAM: PORTABLE CHEST 1 VIEW COMPARISON:  February 13, 2020 FINDINGS: Central catheter tip is in the superior vena cava. No pneumothorax. Extensive airspace opacity consistent with multifocal pneumonia is seen throughout the left lung. There is airspace opacity with consolidation in portions of the right mid and lower lung regions. Heart is mildly enlarged with pulmonary  vascularity normal. No adenopathy. No bone lesions. 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 Widespread multifocal airspace opacity is again noted, more extensive on the left than on the right, consistent with multifocal pneumonia. Suspect atypical organism pneumonia, although there may well be bacterial superinfection in areas of superimposed consolidation. Stable cardiac prominence. Electronically Signed   By: Lowella Grip III M.D.   On: 02/16/2020 10:03   DG Knee Left Port  Result Date: 02/15/2020 CLINICAL DATA:  Left knee pain, no known injury, initial encounter EXAM: PORTABLE LEFT KNEE - 1-2 VIEW COMPARISON:  None. FINDINGS: Tricompartmental degenerative changes are noted worst in the medial joint space. No joint effusion is seen. No acute fracture or dislocation is noted. IMPRESSION: Degenerative change without acute abnormality. Electronically Signed   By: Inez Catalina M.D.   On: 02/15/2020 10:59   DG Knee Right Port  Result Date: 02/15/2020 CLINICAL DATA:  Knee pain, no known injury, initial encounter EXAM: PORTABLE RIGHT KNEE - 2 VIEW COMPARISON:  None. FINDINGS: Tricompartmental degenerative changes are noted. No joint effusion is seen. No acute fracture or dislocation is noted. IMPRESSION: Degenerative change without acute abnormality. Electronically Signed   By: Inez Catalina M.D.   On: 02/15/2020 10:58   DG Abd Portable 1V  Result Date: 02/16/2020 CLINICAL DATA:  Abdominal pain EXAM: PORTABLE ABDOMEN - 1 VIEW COMPARISON:  None. FINDINGS: There is moderate stool in the colon. There is no bowel dilatation or air-fluid level to suggest bowel obstruction. No free air. Total hip replacement on the right. Moderate narrowing left hip joint. Infiltrate noted in the lung bases. IMPRESSION: Moderate stool in colon. No bowel obstruction or free air evident. Arthropathy left hip joint. Infiltrate in lung bases. Electronically Signed   By: Lowella Grip III  M.D.   On: 02/16/2020 10:04   [place] EKG SITE RITE  Result Date: 02/15/2020 If Site Rite image not attached, placement could not be confirmed due to current cardiac rhythm.     Assessment/Plan:  INTERVAL HISTORY:   PICC line placed  Principal Problem:   Acute hypoxemic respiratory failure due to COVID-19 [facility]) Active Problems:   COVID-19 virus  infection   Type 2 diabetes mellitus with vascular disease (HCC)   Rheumatoid arthritis ([place])   PAF (paroxysmal atrial fibrillation) ([place])   CAD (coronary artery disease)   Pneumonia due to COVID-19 virus   Hyponatremia   Multifocal pneumonia   Acute respiratory distress   Acute respiratory failure with hypoxia (HCC)   MSSA bacteremia   Hypoxia   Thrombocytopenia (HCC)   Pressure injury of skin    Javier Beard is a 80 y.o. male with history of rheumatoid arthritis, prior amputation of a toe, admission with COVID-pneumonia now found to have methicillin sensitive Staph aureus bacteremia.  He has developed thrombocytopenia possibly due to the beta-lactam is been changed from cefazolin to vancomycin  MRI L spine with + acute septic arthritis of right L4-5 facet, with myositis adjacent tot this, severe R foraminal stenosis   #1 Methicillin sensitive Staphylococcus aureus bacteremia:  Give the MRI findings I  Suspect he had infection brewign there that then seeded blood while he was an inpatient  Since his blood cultures from 02/10/2020 only grew a Staph capitis in 1/2 if other one is negative will consdier this proof of clearance and  PICC  When he comes out of isolation could do TEE but given he would not be a good operative candidate I do not think it is worthwhile  He will be treated for 6 weeks with IV antibiotics regardles  He is apparently being knee pain but I was underwhelmed by his exam   #2  Lower extremity weakness: Do not think his MRI findings explain this entirely at the stenosis should be chronic not acute that  seen  #3 TTpenia  We have switched from cefazolin to vancomycin platelets are on way up  4 rheumatoid arthritis:  Would NOT let him have Simponi when he leaves and make sure he has completed treatment for his infection FIRST.  Not have a problem with his methotrexate per se  #5 pressure ulcer: Continue to monitor  #6 COVID: has been treated for this and is in airborne, contact though nearing 21 days of isolation and now O3 requirements down as well  Diagnosis: MSSA Bacteremia and septic SI joint  Culture Result: MSSA  Allergies  Allergen Reactions  . Cefazolin Other (See Comments)    thrombotopenia    OPAT Orders Discharge antibiotics:  Vancomycin per pharmacy protocol  Aim for Vancomycin trough 15-20 (unless otherwise indicated)   Duration: 6 weeks  End Date:  03/22/2020  [facility] Care Per Protocol:  Labs BI- weekly while on IV antibiotics:  x__ BMP w GFR  x__ Vancomycin trough  Labs  weekly while on IV antibiotics:  _x_ CBC with differential  _x_ CRP _x_ ESR   x__ Please pull PIC at completion of IV antibiotics __ Please leave PIC in place until doctor has seen patient or been notified  Fax weekly labs to [phone]  Clinic Follow Up Appt:   TAMARICK KOVALCIK has an appointment on 03/12/2020 at [facility] is located in the [facility]  [place] in [place].  [address], which is located to the left of the elevators.  Phone: [phone]  Fax: [phone]  [email or web address]  He should arrive 15 minutes prior to his appointment.  I will sign off for now  Please call with further questions.    LOS: 20 days   Alcide Evener 02/16/2020, 1:04 PM

## 2020-02-16 NOTE — Progress Notes (Signed)
Physical Therapy Treatment Patient Details Name: Javier Beard MRN: [id number] DOB: September 26, 1940 Today's Date: 02/16/2020    History of Present Illness Pt is a 80 y.o. male admitted 2020-02-07 with worsening SOB, nausea/vomitting. Workup for acute hypoxic respiratory failure due to COVID-19 PNA. CTA 1/14 negative for PE. Pt with worsening respiratory status and AMS early AM 1/21; head CT negative for acute abnormality. PMH includes RA, DM2, afib, HF.    PT Comments    Pt asleep on entry, easily roused and agreeable to therapy. Pt request to get OOB, unfortunately pt's RN request return to bed to go off floor for Cortrack placement. Pt aggravated that no one has informed him of procedure. Pt is mod A for bed mobility. Once EOB worked on maintaining upright, and focus on breathing. Once able to maintain sitting balance worked on exercises. Pt is very engaged today and reports wanting to return to higher level of function. Given change in attitude PT recommending CIR level rehab to maximize his recovery from COVID weakness. PT will continue to follow acutely.    Follow Up Recommendations  CIR     Equipment Recommendations  Rolling walker with 5" wheels       Precautions / Restrictions Precautions Precautions: Fall;Other (comment) Precaution Comments: watch SpO2 Restrictions Weight Bearing Restrictions: No    Mobility  Bed Mobility Overal bed mobility: Needs Assistance Bed Mobility: Supine to Sit;Sit to Supine     Supine to sit: Mod assist Sit to supine: Mod assist   General bed mobility comments: modA for coming to EoB, vc for managing LE off bed, requires assist to bring trunk to upright, pt able to scoot hips to EOB. Pt requires modA for bringing LE back into bed, and for squaring trunk to bed  Transfers Overall transfer level: Needs assistance   Transfers: Lateral/Scoot Transfers Sit to Stand: Mod assist         General transfer comment: modA for steadying and maximal cuing  for scooting hips up along bed        Balance Overall balance assessment: Needs assistance Sitting-balance support: Feet supported;Bilateral upper extremity supported Sitting balance-Leahy Scale: Fair Sitting balance - Comments: right posterior lean, able to bring trunk to upright with cuing                                    Cognition Arousal/Alertness: Awake/alert Behavior During Therapy: Flat affect Overall Cognitive Status: Impaired/Different from baseline Area of Impairment: Memory                     Memory: Decreased short-term memory         General Comments: Pt unsure what procedure he is scheduled for today, pt very irritated that "no one tells me anything"      Exercises General Exercises - Lower Extremity Ankle Circles/Pumps: AROM;Both;10 reps Long Arc Quad: AROM;Both;10 reps Hip Flexion/Marching: AAROM;Both;10 reps;Seated Other Exercises Other Exercises: shoulder rolls x 5 Other Exercises: shoulder retraction x5    General Comments General comments (skin integrity, edema, etc.): Pt is on 4L O2 on entry desats to 83%O2. Pt requires increased cuing for purse lip breathing, however able to rebound SaO2 to 92%O2. Pt requires ocassional cuing for breathing while sitting EoB      Pertinent Vitals/Pain Pain Assessment: Faces Faces Pain Scale: Hurts even more Pain Location: back Pain Descriptors / Indicators: Grimacing;Guarding Pain Intervention(s): Limited activity within patient's  tolerance;Monitored during session;Repositioned           PT Goals (current goals can now be found in the care plan section) Acute Rehab PT Goals PT Goal Formulation: With patient Time For Goal Achievement: 02/26/20 Potential to Achieve Goals: Fair Progress towards PT goals: Progressing toward goals    Frequency    Min 2X/week      PT Plan Frequency needs to be updated       AM-PAC PT "6 Clicks" Mobility   Outcome Measure  Help needed  turning from your back to your side while in a flat bed without using bedrails?: A Lot Help needed moving from lying on your back to sitting on the side of a flat bed without using bedrails?: A Lot Help needed moving to and from a bed to a chair (including a wheelchair)?: Total Help needed standing up from a chair using your arms (e.g., wheelchair or bedside chair)?: Total Help needed to walk in hospital room?: Total Help needed climbing 3-5 steps with a railing? : Total 6 Click Score: 8    End of Session Equipment Utilized During Treatment: Oxygen Activity Tolerance: Patient limited by fatigue;Patient limited by pain (onset of back pain with sitting EoB) Patient left: with call bell/phone within reach;in bed;with bed alarm set Nurse Communication: Mobility status PT Visit Diagnosis: Unsteadiness on feet (R26.81);Other abnormalities of gait and mobility (R26.89)     Time: 0938-1829 PT Time Calculation (min) (ACUTE ONLY): 21 min  Charges:  $Therapeutic Exercise: 8-22 mins                     Jenni Thew B. Beverely Risen PT, DPT Acute Rehabilitation Services Pager [phone] Office [phone]    [name] Fleet 02/16/2020, 2:51 PM

## 2020-02-16 NOTE — Progress Notes (Signed)
Peripherally Inserted Central Catheter Placement  The IV Nurse has discussed with the patient and/or persons authorized to consent for the patient, the purpose of this procedure and the potential benefits and risks involved with this procedure.  The benefits include less needle sticks, lab draws from the catheter, and the patient may be discharged home with the catheter. Risks include, but not limited to, infection, bleeding, blood clot (thrombus formation), and puncture of an artery; nerve damage and irregular heartbeat and possibility to perform a PICC exchange if needed/ordered by physician.  Alternatives to this procedure were also discussed.  Bard Power PICC patient education guide, fact sheet on infection prevention and patient information card has been provided to patient /or left at bedside.    PICC Placement Documentation  PICC Single Lumen 02/16/20 PICC Right Brachial 39 cm 0 cm (Active)  Indication for Insertion or Continuance of Line Prolonged intravenous therapies 02/16/20 0925  Exposed Catheter (cm) 0 cm 02/16/20 2947  Site Assessment Clean;Dry;Intact 02/16/20 0925  Line Status Flushed;Blood return noted;Saline locked 02/16/20 0925  Dressing Type Transparent 02/16/20 0925  Dressing Status Clean;Dry;Intact 02/16/20 0925  Antimicrobial disc in place? Yes 02/16/20 0925  Dressing Change Due 02/23/20 02/16/20 0925       Audrie Gallus 02/16/2020, 9:40 AM

## 2020-02-16 NOTE — Progress Notes (Signed)
Nutrition Follow-up  DOCUMENTATION CODES:   Not applicable  INTERVENTION:  Provide Boost Breeze po QID, each supplement provides 250 kcal and 9 grams of protein  Continue 30 ml Prosource plus po TID, each supplement provides 100 kcal and 15 grams of protein.   Encourage adequate PO intake.   If able to place feeding tube, Initiate Osmolite 1.5 cal formula at rate of 20 ml/hr and increase by 10 ml every 6 hours to goal rate of 60 ml/hr.  Provide 45 ml Prosource TF BID per tube.   Tube feeding to provide 2240 kcal, 112 grams of protein, and 1094 ml of free water.   NUTRITION DIAGNOSIS:   Inadequate oral intake related to decreased appetite,acute illness as evidenced by meal completion < 25%; ongoing  GOAL:   Patient will meet greater than or equal to 90% of their needs; progressing  MONITOR:   PO intake,TF tolerance,Labs,Weight trends  REASON FOR ASSESSMENT:   LOS    ASSESSMENT:   80 yo male admitted with acute respiratory failure related to COVID pneumonia and developed MSSA pneumonia/bacteremia, septic shock, AKI. PMH includes RA on immunosuppressants including steroids, DM  1/08 Admitted 1/21 Transferred to ICU with septic shock due to pneumonia requiring pressors 1/27 Transferred out of ICU onto progressive care  Pt is currently on room air. Meal completion has been poor 0-25% since admission. Pt currently has Boost Breeze and Prosource plus ordered with varied consumption with varied consumption. RD to continue with current nutritional supplement orders. As po intake continues to be poor, Cortrak NGT has been ordered for enteral nutrition initiation to provide adequate nutrition. Noted pt refused Cortrak 1/26. Plans for Cortrak placement again today. Tube feeds to be initiated once feeding tube is placed and ready for use. Spoke with RN, pt confused and combative, likely not be able to place feeding tube. If able to place tube, recommend initiation of tube feeds.    Labs and medications reviewed.   Diet Order:   Diet Order            Diet regular Room service appropriate? Yes; Fluid consistency: Thin  Diet effective now           Diet - low sodium heart healthy                 EDUCATION NEEDS:   Not appropriate for education at this time  Skin:  Skin Assessment: Skin Integrity Issues: Skin Integrity Issues:: Stage II DTI: sacrum Stage II: sacrum  Last BM:  1/26  Height:   Ht Readings from Last 1 Encounters:  01/23/2020 5\' 6"  (1.676 m)    Weight:   Wt Readings from Last 1 Encounters:  02/12/20 79.6 kg    BMI:  Body mass index is 28.32 kg/m.  Estimated Nutritional Needs:   Kcal:  02/14/20 kcals  Protein:  110-125 g  Fluid:  >/= 2 L  [phone], MS, RD, LDN RD pager number/after hours weekend pager number on Amion.

## 2020-02-16 NOTE — Progress Notes (Addendum)
[facility] Progress Note  Patient: Javier Beard    IRS:[id number]  DOA: 21-Feb-2020     Date of Service: the patient was seen and examined on 02/16/2020  Brief hospital course: Possible due to history of rheumatoid arthritis, type II DM, A. fib, chronic HFpEF.  Presents with complaints of cough and shortness of breath.  Found to have COVID-19 pneumonia with hypoxia  Currently plan is continue supportive care for hypoxia.  Assessment and Plan: 1. Acute Hypoxic respiratory failure, POA, 92 % on room air on admission.  Acute COVID-19 Viral Pneumonia CXR: hazy bilateral peripheral opacities CTA chest PE protocol 1/14; negative for PE Oxygen requirement: At rest now on room air CRP: Stable at 14.3 Remdesivir: Completed on 1/11 Steroids: On prednisone taper Baricitinib/Actemra: Not initiated,not indicated. Antibiotics: Initiated on 02/09/2020 DVT Prophylaxis:  apixaban (ELIQUIS) tablet 5 mg  Prone positioning and incentive spirometer use recommended.  Overall plan: Continue supportive measures for COVID-19 pneumonia and monitor for complications Last day of isolation 02/16/2020.  The treatment plan and use of medications and known side effects were discussed with patient/family. It was clearly explained that complete risks and long-term side effects are unknown. Patient/family agree with the treatment plan.    2.    Septic shock, not POA with MSSA bacteremia and MSSA pneumonia. L4-L5 septic arthritis. Paraspinous musculature myositis. 2.9 cm soft tissue abscess. Epidural phlegmon without frank epidural abscess. Patient's oxygen condition worsen on 1/21 during transfer to ICU. Blood cultures came back positive for MSSA. Started on IV cefazolin. Currently ID following. Echocardiogram negative for vegetation. Recommend TEE when appropriate from cardiology perspective. MRI C-spine T-spine negative for acute infection. MRI L-spine concerning for septic arthritis. 6 weeks of IV  antibiotics recommended. PICC line inserted. Patient would require a repeat MRI L-spine to ensure resolution of the arthritis. IV cefazolin changed to IV vancomycin in the setting of thrombocytopenia.  3. Rheumatoid arthritis Currently holding all medication. Continue to hold methotrexate due to thrombocytopenia. Continue to hold Simponi until he has finished his antibiotics. Continue to hold Plaquenil for at least 2 weeks. Continue prednisone.  4.  Type 2 diabetes mellitus uncontrolled with hyperglycemia and hypoglycemia without any complication Currently on sliding scale insulin, regimen adjusted  5.  Permanent A. fib On anticoagulation with Eliquis.  Rate controlled.  Without any medication.  6.  Chronic HFpEF Patient was on 60 mg of oral Lasix. Echocardiogram negative for any acute abnormality per Echocardiogram with bubble study also negative for any intra-chamber shunt. Diuretics on hold for now.  7. Acute kidney injury/hyponatremia Likely due to septic shock/hypovolemia/ATN.  Improved.  Monitor sodium levels.  8. Normocytic anemia No evidence of overt bleeding. Currently stable.  9. Thrombocytopenia Platelet count noted to be slowly trending downwards.  Possibly due to sepsis.  Patient however is on apixaban. Medications reviewed.  For now we will hold his hydroxychloroquine. Peripheral smear negative for any schistocytes or acute abnormality. Cefazolin can also cause thrombocytopenia.  Currently on vancomycin  10. Toxic metabolic encephalopathy In the setting of sepsis, renal failure, hypoxemia ICU stay.  Improving.  11.  Poor p.o. intake. Dietary recommended cortrak.  PCCM ordered 1. Unable to insert it without agitation and therefore patient currently refusing it. Monitor. Body mass index is 28.32 kg/m.   Malnutrition Type: Nutrition Problem: Inadequate oral intake Etiology: decreased appetite,acute illness Nutrition Interventions: Interventions:  Tube feeding,Liberalize Diet   12.  Medial sacral DTI. POA Continue foam dressing  Pressure Injury 02/09/20 Sacrum Medial Stage 2 -  Partial thickness loss of dermis presenting as a shallow open injury with a red, pink wound bed without slough. (Active)  02/09/20 0900  Location: Sacrum  Location Orientation: Medial  Staging: Stage 2 -  Partial thickness loss of dermis presenting as a shallow open injury with a red, pink wound bed without slough.  Wound Description (Comments):   Present on Admission: Yes   13.  Constipation. Initiate bowel regimen.  Monitor.  X-ray abdomen negative for any acute intra-abdominal pathology.  Diet: Regular diet DVT Prophylaxis:    apixaban (ELIQUIS) tablet 5 mg    Advance goals of care discussion: Full code  Family Communication: no family was present at bedside, at the time of interview.  Discussed on the phone with daughter on 1/28  Disposition:  Status is: Inpatient  Remains inpatient appropriate because:Inpatient level of care appropriate due to severity of illness   Dispo: The patient is from: Home              Anticipated d/c is to: CIR at [facility]              Anticipated d/c date is: 3 days              Patient currently is not medically stable to d/c.  Subjective: Reports abdominal pain.  No nausea no vomiting.  Constipated.  No chest pain.  No shortness of breath.  Continues to have cough.  Also reports fatigue and tiredness.  Leg pain has improved.  Physical Exam:  General: Appear in mild distress, no Rash; Oral Mucosa Clear, moist. no Abnormal Neck Mass Or lumps, Conjunctiva normal  Cardiovascular: S1 and S2 Present, no Murmur, Respiratory: increased respiratory effort, Bilateral Air entry present and bilateral  Crackles, no wheezes Abdomen: Bowel Sound present, Soft and no tenderness Extremities: trace Pedal edema Neurology: alert and oriented to time, place, and person affect appropriate. no new focal deficit Gait not  checked due to patient safety concerns    Vitals:   02/16/20 0415 02/16/20 0729 02/16/20 1145 02/16/20 1500  BP: (!) 141/77 107/66 116/66 (!) 143/67  Pulse: 98 86 85 94  Resp: (!) 25 (!) 25 (!) 25 (!) 25  Temp: 98.8 F (37.1 C) 98.5 F (36.9 C) 97.8 F (36.6 C) 98.9 F (37.2 C)  TempSrc: Oral Axillary Axillary Axillary  SpO2:  91% 99% 90%  Weight:      Height:        Intake/Output Summary (Last 24 hours) at 02/16/2020 1957 Last data filed at 02/16/2020 1752 Gross per 24 hour  Intake 0 ml  Output 950 ml  Net -950 ml   Filed Weights   02/03/20 0410 02/09/20 0915 02/12/20 0412  Weight: 72.6 kg 77.5 kg 79.6 kg    Data Reviewed: I have personally reviewed and interpreted daily labs, tele strips, imaging. I reviewed all nursing notes, pharmacy notes, vitals, pertinent old records I have discussed plan of care as described above with RN and patient/family.  CBC: Recent Labs  Lab 02/12/20 0418 02/13/20 1310 02/14/20 0307 02/15/20 0756 02/16/20 0204  WBC 9.8 8.5 8.8 6.9 7.3  HGB 9.7* 11.0* 10.9* 10.6* 11.3*  HCT 30.3* 33.1* 32.1* 33.1* 32.6*  MCV 95.3 92.7 92.8 94.8 90.8  PLT 63* 64* 64* 75* 80*   Basic Metabolic Panel: Recent Labs  Lab 02/10/20 0308 02/11/20 0209 02/12/20 0418 02/13/20 0843 02/14/20 0307 02/15/20 0756 02/16/20 0204  NA 135   < > 130* 131* 133* 133* 131*  K 5.2*   < >  3.8 4.0 3.6 4.3 4.1  CL 106   < > 104 104 105 104 102  CO2 15*   < > 18* 18* 18* 20* 20*  GLUCOSE 208*   < > 127* 58* 85 123* 84  BUN 31*   < > 18 18 18 16 13   CREATININE 1.32*   < > 0.69 0.70 0.70 0.59* 0.53*  CALCIUM 7.5*   < > 7.5* 7.5* 7.6* 7.3* 7.5*  MG 2.1   < > 2.1 2.0 2.0 2.0 1.9  PHOS 4.0  --   --   --   --   --   --    < > = values in this interval not displayed.    Studies: DG CHEST PORT 1 VIEW  Result Date: 02/16/2020 CLINICAL DATA:  Central catheter placement. Reported COVID-19 positive EXAM: PORTABLE CHEST 1 VIEW COMPARISON:  February 13, 2020 FINDINGS:  Central catheter tip is in the superior vena cava. No pneumothorax. Extensive airspace opacity consistent with multifocal pneumonia is seen throughout the left lung. There is airspace opacity with consolidation in portions of the right mid and lower lung regions. Heart is mildly enlarged with pulmonary vascularity normal. No adenopathy. No bone lesions. 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 Widespread multifocal airspace opacity is again noted, more extensive on the left than on the right, consistent with multifocal pneumonia. Suspect atypical organism pneumonia, although there may well be bacterial superinfection in areas of superimposed consolidation. Stable cardiac prominence. Electronically Signed   By: February 15, 2020 III M.D.   On: 02/16/2020 10:03   DG Abd Portable 1V  Result Date: 02/16/2020 CLINICAL DATA:  Abdominal pain EXAM: PORTABLE ABDOMEN - 1 VIEW COMPARISON:  None. FINDINGS: There is moderate stool in the colon. There is no bowel dilatation or air-fluid level to suggest bowel obstruction. No free air. Total hip replacement on the right. Moderate narrowing left hip joint. Infiltrate noted in the lung bases. IMPRESSION: Moderate stool in colon. No bowel obstruction or free air evident. Arthropathy left hip joint. Infiltrate in lung bases. Electronically Signed   By: 02/18/2020 III M.D.   On: 02/16/2020 10:04    Scheduled Meds: . (feeding supplement) PROSource Plus  30 mL Oral TID WC  . apixaban  5 mg Oral BID  . vitamin C  500 mg Oral Daily  . calcium carbonate  1,250 mg Oral Q breakfast  . calcium carbonate  0.5 tablet Oral Q supper  . chlorhexidine  15 mL Mouth Rinse BID  . Chlorhexidine Gluconate Cloth  6 each Topical Daily  . docusate sodium  100 mg Oral BID  . feeding supplement  1 Container Oral QID  . folic acid  1 mg Oral Daily  . gabapentin  100 mg Oral QHS  . insulin aspart  0-9 Units Subcutaneous TID WC  . lactulose  20 g Oral  BID  . lidocaine  1 patch Transdermal Q24H  . mouth rinse  15 mL Mouth Rinse BID  . pantoprazole  40 mg Oral Daily  . polyethylene glycol  17 g Oral Daily  . pravastatin  80 mg Oral q1800  . predniSONE  20 mg Oral Q breakfast   Followed by  . [START ON 02/18/2020] predniSONE  10 mg Oral Q breakfast   Followed by  . [START ON 02/23/2020] predniSONE  5 mg Oral Q breakfast  . vitamin B-12  1,000 mcg Oral Daily  . zinc sulfate  220 mg Oral Daily  Continuous Infusions: . sodium chloride Stopped (02/14/20 1855)  . vancomycin 1,000 mg (02/16/20 1746)   PRN Meds: acetaminophen **OR** acetaminophen, methocarbamol, morphine injection, oxyCODONE, senna-docusate, sodium chloride flush  Time spent: 35 minutes  Author: Lynden Oxford, MD [facility] 02/16/2020 7:57 PM  To reach On-call, see care teams to locate the attending and reach out via [email or web address]. Between 7PM-7AM, please contact night-coverage If you still have difficulty reaching the attending provider, please page the [facility] (Director on Call) for [facility] on amion for assistance.

## 2020-02-16 NOTE — Progress Notes (Signed)
Inpatient Rehab Admissions Coordinator Note:   Per updated therapy recommendations, pt was screened for CIR candidacy by Estill Dooms, PT, DPT.  Note pursuing [facility].  If pt would like to be considered for [facility] inpatient rehab please place an IP Rehab MD Consult order.   Please contact me with questions.   Estill Dooms, PT, DPT [phone] 02/16/20 4:14 PM

## 2020-02-16 NOTE — Plan of Care (Signed)
  Problem: Education: Goal: Knowledge of risk factors and measures for prevention of condition will improve Outcome: Progressing   Problem: Coping: Goal: Psychosocial and spiritual needs will be supported Outcome: Progressing   Problem: Respiratory: Goal: Will maintain a patent airway Outcome: Progressing Goal: Complications related to the disease process, condition or treatment will be avoided or minimized Outcome: Progressing   Problem: Education: Goal: Knowledge of General Education information will improve Description: Including pain rating scale, medication(s)/side effects and non-pharmacologic comfort measures Outcome: Progressing   Problem: Health Behavior/Discharge Planning: Goal: Ability to manage health-related needs will improve Outcome: Progressing   Problem: Clinical Measurements: Goal: Ability to maintain clinical measurements within normal limits will improve Outcome: Progressing Goal: Will remain free from infection Outcome: Progressing Goal: Diagnostic test results will improve Outcome: Progressing Goal: Respiratory complications will improve Outcome: Progressing Goal: Cardiovascular complication will be avoided Outcome: Progressing   Problem: Activity: Goal: Risk for activity intolerance will decrease Outcome: Progressing   Problem: Nutrition: Goal: Adequate nutrition will be maintained Outcome: Progressing   Problem: Coping: Goal: Level of anxiety will decrease Outcome: Progressing   

## 2020-02-16 NOTE — Progress Notes (Signed)
Pharmacy Antibiotic Note  Javier Beard is a 80 y.o. male admitted on 01/24/2020 with MSSA bacteremia.  Pharmacy has been consulted for vancomycin dosing. Patient was previously on cefazolin for his MSSA bacteremia, however he has developed thrombocytopenia without an obvious cause. Cefazolin has the potential to cause this in patients so the ID team is recommending changing to vancomycin to see if it will help his platelet recovery  Vancomycin levels obtained at [facility]. Peak 36, trough 18 (drawn 3 hours early TT 13). Current AUC at 1000 mg q12h is 563, within the goal range of 400-600. Continue current dose, scr remains stable. OPAT orders have been placed   Plan: Continue Vancomycin 1000mg  IV q12h through 03/21/2020 Levels as indicated while inpatient   Height: 5\' 6"  (167.6 cm) Weight: 79.6 kg (175 lb 7.8 oz) IBW/kg (Calculated) : 63.8  Temp (24hrs), Avg:98.1 F (36.7 C), Min:97.2 F (36.2 C), Max:98.8 F (37.1 C)  Recent Labs  Lab 02/12/20 0418 02/13/20 0843 02/13/20 1310 02/14/20 0307 02/15/20 0756 02/15/20 1908 02/16/20 0204  WBC 9.8  --  8.5 8.8 6.9  --  7.3  CREATININE 0.69 0.70  --  0.70 0.59*  --  0.53*  VANCOTROUGH  --   --   --   --   --   --  18  VANCOPEAK  --   --   --   --   --  36  --     Estimated Creatinine Clearance: 76.7 mL/min (A) (by C-G formula based on SCr of 0.53 mg/dL (L)).    No Known Allergies  Antimicrobials this admission: 1/21 linezolid x1 1/21 cefepime x1 1/22 cefazolin >>1/25 1/25 vancomycin >>   Microbiology results: 1/21 BCx: MSSA 1/22 BCx: staph captis 1/25 BCx ngtd  Thank you for allowing pharmacy to be a part of this patient's care.  2/22 [facility] 02/16/2020 8:08 AM

## 2020-02-16 NOTE — Progress Notes (Signed)
RN called to bedside. Dietary attempted to place cortrak at bedside but patient refused and became combative. MD notified.

## 2020-02-17 DIAGNOSIS — E871 Hypo-osmolality and hyponatremia: Secondary | ICD-10-CM | POA: Diagnosis not present

## 2020-02-17 DIAGNOSIS — R0603 Acute respiratory distress: Secondary | ICD-10-CM | POA: Diagnosis not present

## 2020-02-17 DIAGNOSIS — J9601 Acute respiratory failure with hypoxia: Secondary | ICD-10-CM | POA: Diagnosis not present

## 2020-02-17 DIAGNOSIS — U071 COVID-19: Secondary | ICD-10-CM | POA: Diagnosis not present

## 2020-02-17 LAB — GLUCOSE, CAPILLARY
Glucose-Capillary: 104 mg/dL — ABNORMAL HIGH (ref 70–99)
Glucose-Capillary: 128 mg/dL — ABNORMAL HIGH (ref 70–99)
Glucose-Capillary: 198 mg/dL — ABNORMAL HIGH (ref 70–99)
Glucose-Capillary: 203 mg/dL — ABNORMAL HIGH (ref 70–99)
Glucose-Capillary: 269 mg/dL — ABNORMAL HIGH (ref 70–99)

## 2020-02-17 LAB — BASIC METABOLIC PANEL
Anion gap: 8 (ref 5–15)
BUN: 7 mg/dL — ABNORMAL LOW (ref 8–23)
CO2: 21 mmol/L — ABNORMAL LOW (ref 22–32)
Calcium: 7.7 mg/dL — ABNORMAL LOW (ref 8.9–10.3)
Chloride: 102 mmol/L (ref 98–111)
Creatinine, Ser: 0.62 mg/dL (ref 0.61–1.24)
GFR, Estimated: >60 mL/min (ref >60)
Glucose, Bld: 86 mg/dL (ref 70–99)
Potassium: 3.7 mmol/L (ref 3.5–5.1)
Sodium: 131 mmol/L — ABNORMAL LOW (ref 135–145)

## 2020-02-17 LAB — MAGNESIUM: Magnesium: 1.7 mg/dL (ref 1.7–2.4)

## 2020-02-17 LAB — C-REACTIVE PROTEIN: CRP: 19.8 mg/dL — ABNORMAL HIGH (ref <1.0)

## 2020-02-17 LAB — CBC
HCT: 29.8 % — ABNORMAL LOW (ref 39.0–52.0)
Hemoglobin: 10.1 g/dL — ABNORMAL LOW (ref 13.0–17.0)
MCH: 31.5 pg (ref 26.0–34.0)
MCHC: 33.9 g/dL (ref 30.0–36.0)
MCV: 92.8 fL (ref 80.0–100.0)
Platelets: 96 10*3/uL — ABNORMAL LOW (ref 150–400)
RBC: 3.21 MIL/uL — ABNORMAL LOW (ref 4.22–5.81)
RDW: 15.9 % — ABNORMAL HIGH (ref 11.5–15.5)
WBC: 7.1 10*3/uL (ref 4.0–10.5)
nRBC: 0 % (ref 0.0–0.2)

## 2020-02-17 MED ORDER — PHENOL 1.4 % MT LIQD: 1.0000 | OROMUCOSAL | Status: DC | PRN

## 2020-02-17 MED ORDER — FUROSEMIDE 40 MG PO TABS
40.0000 mg | ORAL_TABLET | Freq: Every day | ORAL | Status: DC
  Administered 2020-02-17 – 2020-02-20 (×4): 40 mg via ORAL
  Filled 2020-02-17 (×4): qty 1

## 2020-02-17 MED ORDER — LIP MEDEX EX OINT
TOPICAL_OINTMENT | CUTANEOUS | Status: DC | PRN
  Filled 2020-02-17: qty 7

## 2020-02-17 NOTE — Progress Notes (Addendum)
[facility] Progress Note  Patient: Javier Beard    KGU:[id number]  DOA: 02/03/2020     Date of Service: the patient was seen and examined on 02/17/2020  Brief hospital course: Possible due to history of rheumatoid arthritis, type II DM, A. fib, chronic HFpEF.  Presents with complaints of cough and shortness of breath.  Found to have COVID-19 pneumonia with hypoxia  Currently plan is continue supportive care for hypoxia, monitor for improvement in oral intake  Assessment and Plan: 1. Acute Hypoxic respiratory failure, POA, 92 % on room air on admission.  Acute COVID-19 Viral Pneumonia CXR: hazy bilateral peripheral opacities CTA chest PE protocol 1/14; negative for PE Oxygen requirement: At rest 2 LPM right now.  Somewhat respiratory distress CRP: Trending up right now. Remdesivir: Completed on 1/11 Steroids: On prednisone taper Baricitinib/Actemra: Not initiated,not indicated. Antibiotics: Initiated on 02/09/2020 DVT Prophylaxis:  apixaban (ELIQUIS) tablet 5 mg  Prone positioning and incentive spirometer use recommended.  Overall plan: Continue supportive measures for COVID-19 pneumonia and monitor for complications Currently out of isolation.  Transfer to progressive care unit. Appears to have more shortness of breath.  RN reported aspiration event overnight.  Speech therapy consulted.  Currently on dysphagia 3 diet.  The treatment plan and use of medications and known side effects were discussed with patient/family. It was clearly explained that complete risks and long-term side effects are unknown. Patient/family agree with the treatment plan.    2.    Septic shock, not POA with MSSA bacteremia and MSSA pneumonia. L4-L5 septic arthritis. Paraspinous musculature myositis. 2.9 cm soft tissue abscess. Epidural phlegmon without frank epidural abscess. Patient's oxygen condition worsen on 1/21 during transfer to ICU. Blood cultures came back positive for MSSA. Started on  IV cefazolin. Currently ID following. Echocardiogram negative for vegetation. Recommend TEE when appropriate from cardiology perspective. MRI C-spine T-spine negative for acute infection. MRI L-spine concerning for septic arthritis. 6 weeks of IV antibiotics recommended. PICC line inserted. Patient would require a repeat MRI L-spine to ensure resolution of the arthritis. IV cefazolin changed to IV vancomycin in the setting of thrombocytopenia.  3. Rheumatoid arthritis Currently holding all medication. Continue to hold methotrexate due to thrombocytopenia. Continue to hold Simponi until he has finished his antibiotics. Continue to hold Plaquenil for now Continue prednisone.  4.  Type 2 diabetes mellitus uncontrolled with hyperglycemia and hypoglycemia without any complication Currently on sliding scale insulin, regimen adjusted  5.  Permanent A. fib On anticoagulation with Eliquis.  Rate controlled.  Without any medication.  6.  Chronic HFpEF Patient was on 60 mg of oral Lasix. Echocardiogram negative for any acute abnormality per Echocardiogram with bubble study also negative for any intra-chamber shunt. Resume oral Lasix  7. Acute kidney injury/hyponatremia Likely due to septic shock/hypovolemia/ATN.  Improved.  Monitor sodium levels.  8. Normocytic anemia No evidence of overt bleeding. Currently stable.  9. Thrombocytopenia Platelet count noted to be slowly trending downwards.  Possibly due to sepsis.  Patient however is on apixaban. Medications reviewed.  For now we will hold his hydroxychloroquine. Peripheral smear negative for any schistocytes or acute abnormality. Cefazolin can also cause thrombocytopenia.  Currently on vancomycin  10. Toxic metabolic encephalopathy In the setting of sepsis, renal failure, hypoxemia ICU stay.  Improving.  11.  Poor p.o. intake. Dietary recommended cortrak.  PCCM ordered. Unable to insert it without agitation and therefore  patient currently refusing it. Monitor. Body mass index is 28.32 kg/m.   Malnutrition Type: Nutrition Problem:  Inadequate oral intake Etiology: decreased appetite,acute illness Nutrition Interventions: Interventions: Tube feeding,Liberalize Diet   12.  Medial sacral DTI. POA Continue foam dressing  Pressure Injury 02/09/20 Sacrum Medial Stage 2 -  Partial thickness loss of dermis presenting as a shallow open injury with a red, pink wound bed without slough. (Active)  02/09/20 0900  Location: Sacrum  Location Orientation: Medial  Staging: Stage 2 -  Partial thickness loss of dermis presenting as a shallow open injury with a red, pink wound bed without slough.  Wound Description (Comments):   Present on Admission: Yes   13.  Constipation. Initiate bowel regimen.  Monitor.  X-ray abdomen negative for any acute intra-abdominal pathology.  Diet: Dysphagia 3 diet DVT Prophylaxis:    apixaban (ELIQUIS) tablet 5 mg    Advance goals of care discussion: Full code  Family Communication: no family was present at bedside, at the time of interview.  Discussed on the phone with daughter on 1/28  Disposition:  Status is: Inpatient  Remains inpatient appropriate because:Inpatient level of care appropriate due to severity of illness   Dispo: The patient is from: Home              Anticipated d/c is to: CIR at [facility]              Anticipated d/c date is: 3 days              Patient currently is not medically stable to d/c.  Subjective: Reports abdominal pain.  No nausea no vomiting.  Constipated.  No chest pain.  No shortness of breath.  Continues to have cough.  Also reports fatigue and tiredness.  Leg pain has improved.  Physical Exam:  General: Appear in mild distress, no Rash; Oral Mucosa Clear, moist. no Abnormal Neck Mass Or lumps, Conjunctiva normal  Cardiovascular: S1 and S2 Present, no Murmur, Respiratory: increased respiratory effort, Bilateral Air entry present and  bilateral  Crackles, no wheezes Abdomen: Bowel Sound present, Soft and no tenderness Extremities: trace Pedal edema Neurology: alert and oriented to time, place, and person affect appropriate. no new focal deficit Gait not checked due to patient safety concerns    Vitals:   02/17/20 0856 02/17/20 0941 02/17/20 1203 02/17/20 1552  BP: 104/69 139/67 129/75 126/64  Pulse: 94 (!) 112 (!) 108 (!) 107  Resp: (!) 26 (!) 30 (!) 34 (!) 24  Temp: 98.8 F (37.1 C) 98.2 F (36.8 C) 98.6 F (37 C) 98.9 F (37.2 C)  TempSrc: Oral Oral Oral Axillary  SpO2: 91% 90% 92% 92%  Weight:      Height:        Intake/Output Summary (Last 24 hours) at 02/17/2020 1828 Last data filed at 02/17/2020 0900 Gross per 24 hour  Intake 200 ml  Output 400 ml  Net -200 ml   Filed Weights   02/03/20 0410 02/09/20 0915 02/12/20 0412  Weight: 72.6 kg 77.5 kg 79.6 kg    Data Reviewed: I have personally reviewed and interpreted daily labs, tele strips, imaging. I reviewed all nursing notes, pharmacy notes, vitals, pertinent old records I have discussed plan of care as described above with RN and patient/family.  CBC: Recent Labs  Lab 02/13/20 1310 02/14/20 0307 02/15/20 0756 02/16/20 0204 02/17/20 0421  WBC 8.5 8.8 6.9 7.3 7.1  HGB 11.0* 10.9* 10.6* 11.3* 10.1*  HCT 33.1* 32.1* 33.1* 32.6* 29.8*  MCV 92.7 92.8 94.8 90.8 92.8  PLT 64* 64* 75* 80* 96*  Basic Metabolic Panel: Recent Labs  Lab 02/13/20 0843 02/14/20 0307 02/15/20 0756 02/16/20 0204 02/17/20 0421  NA 131* 133* 133* 131* 131*  K 4.0 3.6 4.3 4.1 3.7  CL 104 105 104 102 102  CO2 18* 18* 20* 20* 21*  GLUCOSE 58* 85 123* 84 86  BUN 18 18 16 13  7*  CREATININE 0.70 0.70 0.59* 0.53* 0.62  CALCIUM 7.5* 7.6* 7.3* 7.5* 7.7*  MG 2.0 2.0 2.0 1.9 1.7    Studies: No results found.  Scheduled Meds: . (feeding supplement) PROSource Plus  30 mL Oral TID WC  . apixaban  5 mg Oral BID  . vitamin C  500 mg Oral Daily  . calcium carbonate   1,250 mg Oral Q breakfast  . calcium carbonate  0.5 tablet Oral Q supper  . chlorhexidine  15 mL Mouth Rinse BID  . Chlorhexidine Gluconate Cloth  6 each Topical Daily  . docusate sodium  100 mg Oral BID  . feeding supplement  1 Container Oral QID  . folic acid  1 mg Oral Daily  . gabapentin  100 mg Oral QHS  . insulin aspart  0-9 Units Subcutaneous TID WC  . lactulose  20 g Oral BID  . lidocaine  1 patch Transdermal Q24H  . mouth rinse  15 mL Mouth Rinse BID  . pantoprazole  40 mg Oral Daily  . polyethylene glycol  17 g Oral Daily  . pravastatin  80 mg Oral q1800  . predniSONE  20 mg Oral Q breakfast   Followed by  . [START ON 02/18/2020] predniSONE  10 mg Oral Q breakfast   Followed by  . [START ON 02/23/2020] predniSONE  5 mg Oral Q breakfast  . vitamin B-12  1,000 mcg Oral Daily  . zinc sulfate  220 mg Oral Daily   Continuous Infusions: . sodium chloride Stopped (02/14/20 1855)  . vancomycin 1,000 mg (02/17/20 1727)   PRN Meds: acetaminophen **OR** acetaminophen, lip balm, methocarbamol, morphine injection, oxyCODONE, phenol, senna-docusate, sodium chloride flush  Time spent: 35 minutes  Author: 02/19/20, MD [facility] 02/17/2020 6:28 PM  To reach On-call, see care teams to locate the attending and reach out via [email or web address]. Between 7PM-7AM, please contact night-coverage If you still have difficulty reaching the attending provider, please page the [facility] (Director on Call) for [facility] on amion for assistance.

## 2020-02-17 NOTE — Progress Notes (Signed)
Yellow MEWS fired @0745 . Patient c/o of 7/10 pain which is chronic. PRN pain meds administered. Will continue to monitor patient.

## 2020-02-18 ENCOUNTER — Inpatient Hospital Stay (HOSPITAL_COMMUNITY): Payer: Medicare Other

## 2020-02-18 DIAGNOSIS — J9601 Acute respiratory failure with hypoxia: Secondary | ICD-10-CM | POA: Diagnosis not present

## 2020-02-18 DIAGNOSIS — E871 Hypo-osmolality and hyponatremia: Secondary | ICD-10-CM | POA: Diagnosis not present

## 2020-02-18 DIAGNOSIS — U071 COVID-19: Secondary | ICD-10-CM | POA: Diagnosis not present

## 2020-02-18 DIAGNOSIS — R0603 Acute respiratory distress: Secondary | ICD-10-CM | POA: Diagnosis not present

## 2020-02-18 LAB — CBC
HCT: 34.7 % — ABNORMAL LOW (ref 39.0–52.0)
Hemoglobin: 11.2 g/dL — ABNORMAL LOW (ref 13.0–17.0)
MCH: 30.3 pg (ref 26.0–34.0)
MCHC: 32.3 g/dL (ref 30.0–36.0)
MCV: 93.8 fL (ref 80.0–100.0)
Platelets: 127 10*3/uL — ABNORMAL LOW (ref 150–400)
RBC: 3.7 MIL/uL — ABNORMAL LOW (ref 4.22–5.81)
RDW: 15.4 % (ref 11.5–15.5)
WBC: 7.1 10*3/uL (ref 4.0–10.5)
nRBC: 0 % (ref 0.0–0.2)

## 2020-02-18 LAB — CULTURE, BLOOD (ROUTINE X 2)
Culture: NO GROWTH
Culture: NO GROWTH
Special Requests: ADEQUATE

## 2020-02-18 LAB — GLUCOSE, CAPILLARY
Glucose-Capillary: 161 mg/dL — ABNORMAL HIGH (ref 70–99)
Glucose-Capillary: 238 mg/dL — ABNORMAL HIGH (ref 70–99)
Glucose-Capillary: 263 mg/dL — ABNORMAL HIGH (ref 70–99)
Glucose-Capillary: 195 mg/dL — ABNORMAL HIGH (ref 70–99)

## 2020-02-18 LAB — MAGNESIUM: Magnesium: 1.8 mg/dL (ref 1.7–2.4)

## 2020-02-18 LAB — C-REACTIVE PROTEIN: CRP: 24.5 mg/dL — ABNORMAL HIGH (ref <1.0)

## 2020-02-18 IMAGING — DX DG CHEST 1V PORT
2 series · 2 of 2 positions shown · non-contrast
Comparison: Portable exam [3T] hours compared to [DATE]

CLINICAL DATA: [3T] pneumonia with hypoxia, type II diabetes
mellitus, atrial fibrillation, cough and shortness of breath

EXAM:
PORTABLE CHEST 1 VIEW

[chest ap (1 of 2)]
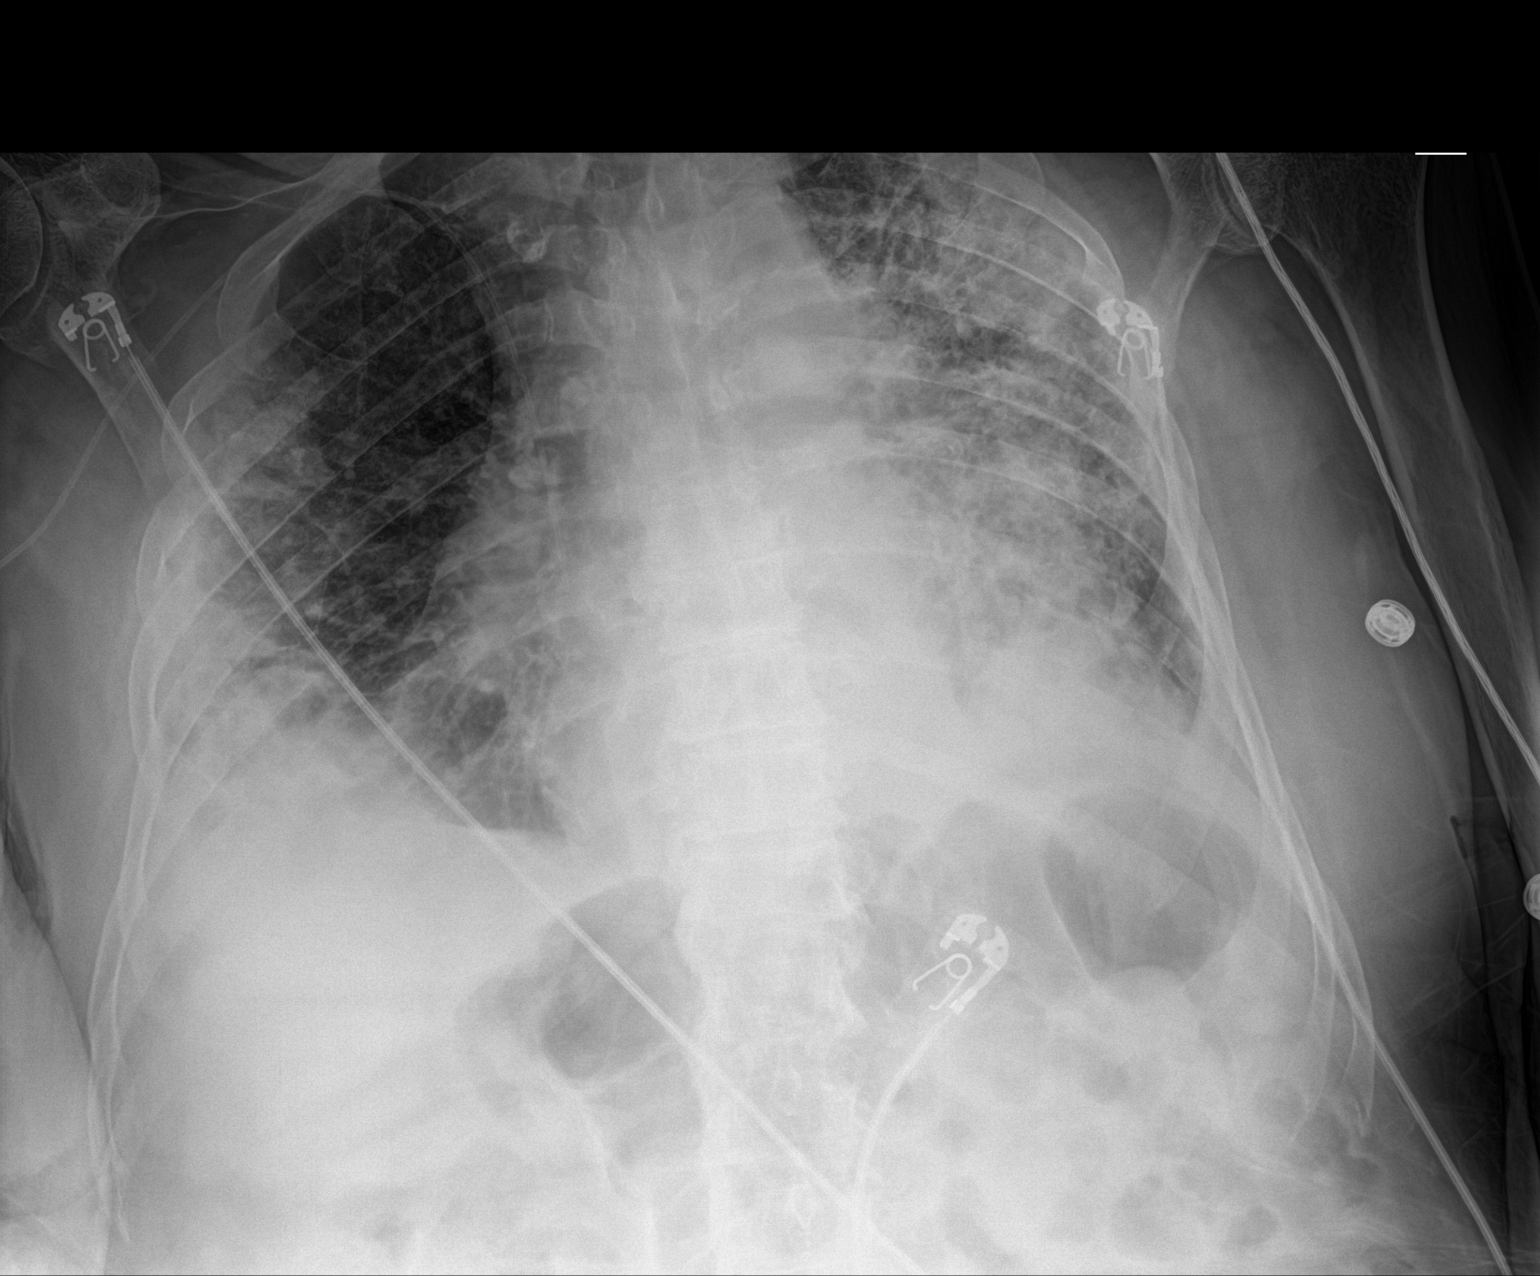

[chest ap (2 of 2)]
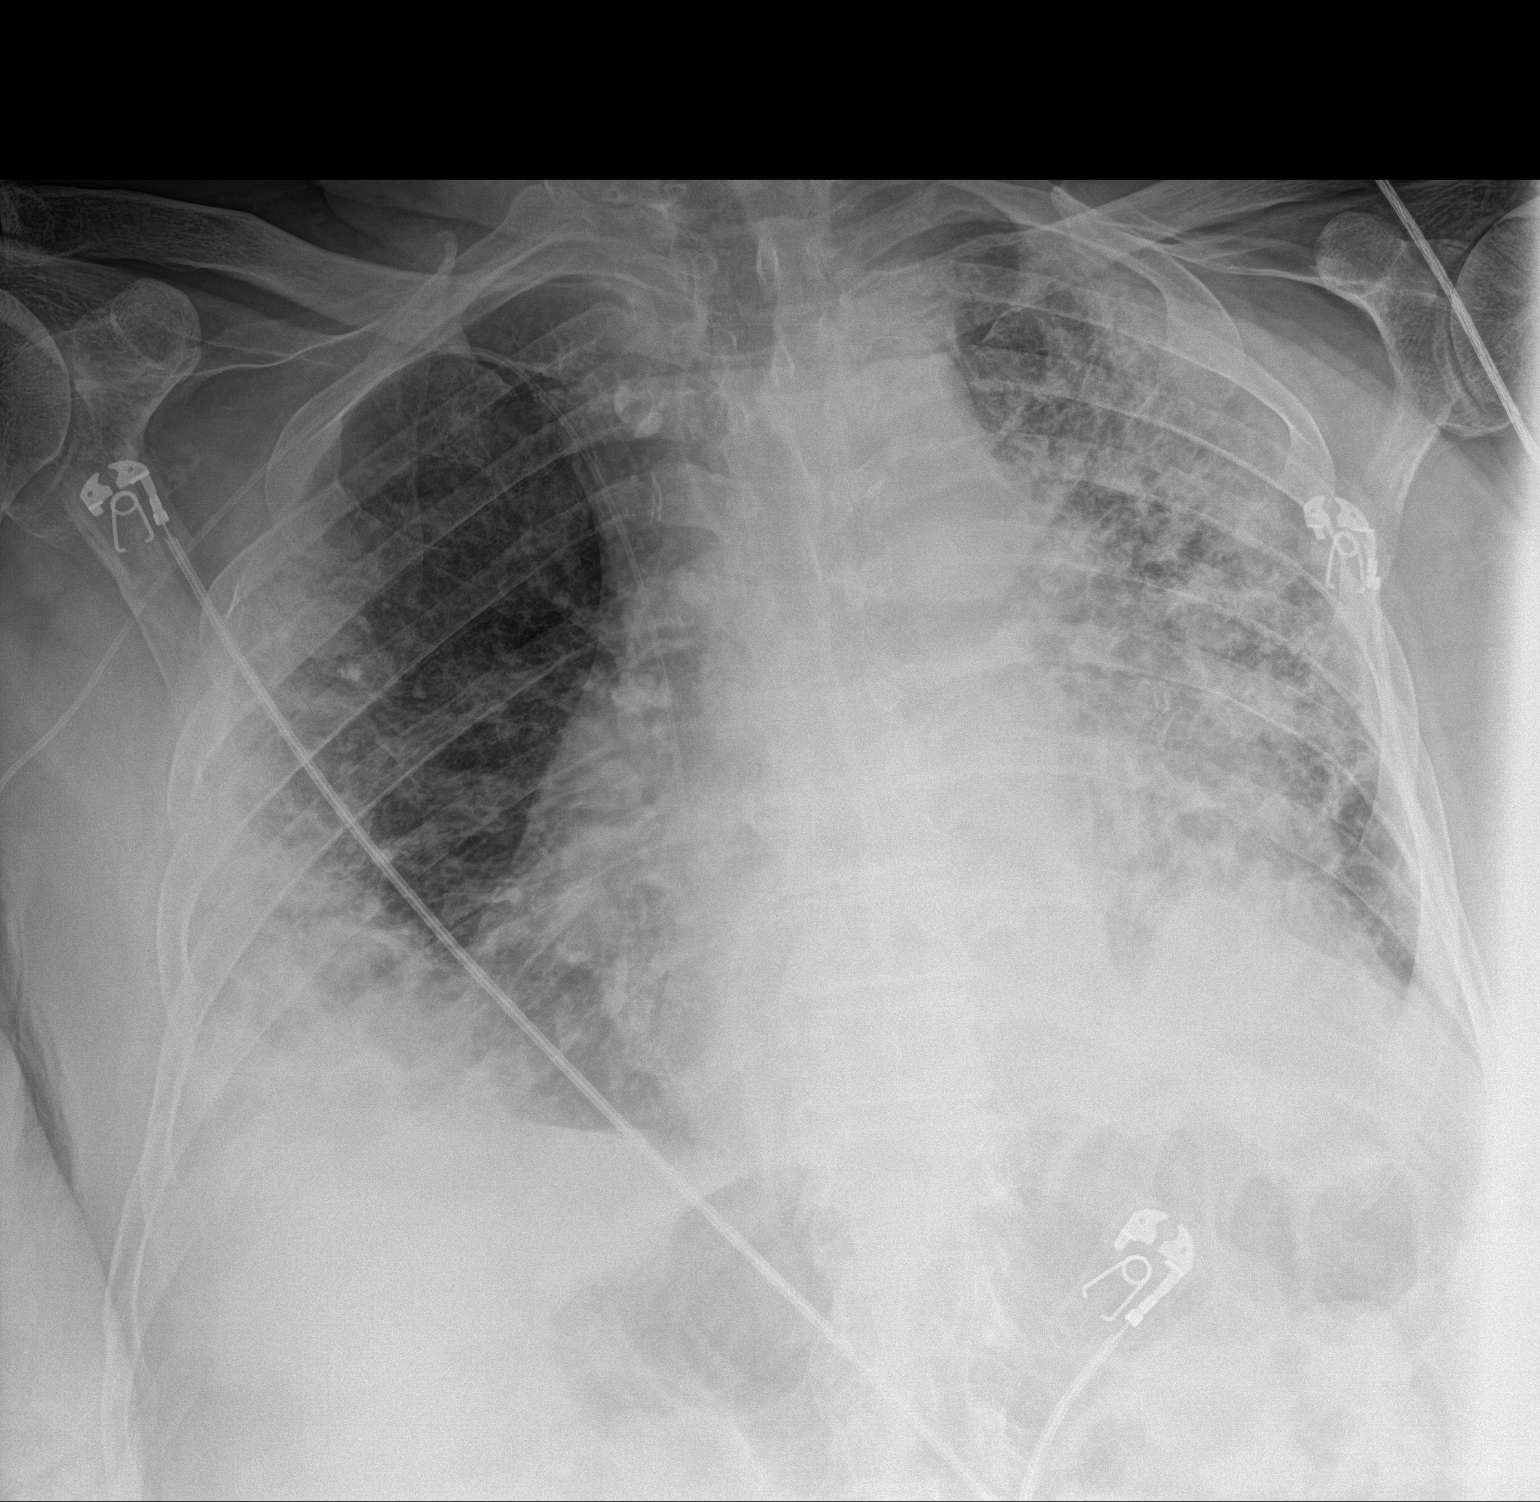

[2 of 2 positions shown; findings below may reference images not displayed]

FINDINGS: RIGHT arm PICC line tip projects over SVC.

Upper normal size of cardiac silhouette.

Stable mediastinal contours.

Extensive BILATERAL airspace infiltrates consistent with multifocal
pneumonia, greater on LEFT, minimally increased in RIGHT upper lobe.

Question small RIGHT pleural effusion.

No pneumothorax.

Bones demineralized.
IMPRESSION: BILATERAL pulmonary infiltrates LEFT greater than RIGHT consistent
with multifocal pneumonia and [3T], minimally increased in RIGHT
upper lobe.

## 2020-02-18 NOTE — Evaluation (Signed)
Clinical/Bedside Swallow Evaluation Patient Details  Name: Javier Beard MRN: [id number] Date of Birth: 1940/01/21  Today's Date: 02/18/2020 Time: SLP Start Time (ACUTE ONLY): 1655 SLP Stop Time (ACUTE ONLY): 1715 SLP Time Calculation (min) (ACUTE ONLY): 20 min  Past Medical History:  Past Medical History:  Diagnosis Date  . Arthritis   . Atrial fibrillation (HCC)   . Coronary artery disease   . DM type 2 (diabetes mellitus, type 2) (HCC)   . Hypertension   . Rheumatoid arteritis (HCC)    Past Surgical History:  Past Surgical History:  Procedure Laterality Date  . CARDIAC CATHETERIZATION    . CORONARY ANGIOPLASTY    . HIP SURGERY    . HIP SURGERY     HPI:  Javier Beard is a 80 y.o. male admitted 02/10/2020 with worsening SOB, nausea/vomitting. Workup for acute hypoxic respiratory failure due to COVID-19 PNA. CTA 1/14 negative for PE. Javier Beard with worsening respiratory status and AMS early AM 1/21; head CT negative for acute abnormality. PMH includes RA, DM2, afib, HF.  RN with concern regarding potential aspiration event on 1/29 and SLP was ordered.   Assessment / Plan / Recommendation Clinical Impression  Patient presents with a mild oropharyngeal dysphagia. Patient exhibited one delayed cough during PO intake which sounded dry. Patient's voice is mildly hoarse but otherwise clear and no changes in voice during or after PO intake. SpO2 saturations remained in low to mid 90's with nasal cannula (frequently got pushed to side and so patient was without direct oxygen but still maintained above 90%. RN entered room and did not report any observed coughing or difficulty with PO's recently. SLP recommending patient continue on Dys 3, thin liquids. SLP Visit Diagnosis: Dysphagia, unspecified (R13.10)    Aspiration Risk  Mild aspiration risk    Diet Recommendation Dysphagia 3 (Mech soft);Thin liquid   Liquid Administration via: Cup;Straw Medication Administration: Whole meds with puree Supervision:  Staff to assist with self feeding;Full supervision/cueing for compensatory strategies Compensations: Minimize environmental distractions;Slow rate;Small sips/bites Postural Changes: Seated upright at 90 degrees    Other  Recommendations Oral Care Recommendations: Oral care BID;Staff/trained caregiver to provide oral care   Follow up Recommendations None      Frequency and Duration min 1 x/week  1 week       Prognosis Prognosis for Safe Diet Advancement: Good      Swallow Study   General Date of Onset: 02/17/20 HPI: Javier Beard is a 81 y.o. male admitted 02/12/2020 with worsening SOB, nausea/vomitting. Workup for acute hypoxic respiratory failure due to COVID-19 PNA. CTA 1/14 negative for PE. Javier Beard with worsening respiratory status and AMS early AM 1/21; head CT negative for acute abnormality. PMH includes RA, DM2, afib, HF.  RN with concern regarding potential aspiration event on 1/29 and SLP was ordered. 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  Patient reported dry cough that has been going on for 2-3 weeks and happens regardless of eating or drinking or not    Nectar Thick     Honey Thick     Puree Puree: Within functional limits   Solid     Solid: Within functional limits Other Comments: Patient consumed soft solids without overt s/s of aspiration or penetration or observable difficulty in mastication or oral control      Angela Nevin, MA, CCC-SLP Speech Therapy [facility]

## 2020-02-18 NOTE — Progress Notes (Signed)
[facility] Progress Note  Patient: Javier Beard    MOL:[id number]  DOA: 02/09/2020     Date of Service: the patient was seen and examined on 02/18/2020  Brief hospital course: Possible due to history of rheumatoid arthritis, type II DM, A. fib, chronic HFpEF.  Presents with complaints of cough and shortness of breath.  Found to have COVID-19 pneumonia with hypoxia  Currently plan is continue supportive care for hypoxia, monitor for improvement in oral intake  Assessment and Plan: 1. Acute Hypoxic respiratory failure, POA, 92 % on room air on admission.  Acute COVID-19 Viral Pneumonia CXR: hazy bilateral peripheral opacities CTA chest PE protocol 1/14; negative for PE Oxygen requirement: At rest 2 LPM right now.  Somewhat respiratory distress CRP: Trending up right now. Remdesivir: Completed on 1/11 Steroids: On prednisone taper Baricitinib/Actemra: Not initiated,not indicated. Antibiotics: Initiated on 02/09/2020 DVT Prophylaxis:  apixaban (ELIQUIS) tablet 5 mg  Prone positioning and incentive spirometer use recommended.  Overall plan: Continue supportive measures for COVID-19 pneumonia and monitor for complications Currently out of isolation.   The treatment plan and use of medications and known side effects were discussed with patient/family. It was clearly explained that complete risks and long-term side effects are unknown. Patient/family agree with the treatment plan.    2.    Septic shock, not POA with MSSA bacteremia and MSSA pneumonia. L4-L5 septic arthritis. Paraspinous musculature myositis. 2.9 cm soft tissue abscess. Epidural phlegmon without frank epidural abscess. Patient's oxygen condition worsen on 1/21 during transfer to ICU. Blood cultures came back positive for MSSA. Started on IV cefazolin. Currently ID following. Echocardiogram negative for vegetation. Recommend TEE when appropriate from cardiology perspective. MRI C-spine T-spine negative for  acute infection. MRI L-spine concerning for septic arthritis. 6 weeks of IV antibiotics recommended. PICC line inserted. Patient would require a repeat MRI L-spine to ensure resolution of the arthritis. IV cefazolin changed to IV vancomycin in the setting of thrombocytopenia. Reported shaking chills on 1/30.  check chest x-ray, urine culture, blood culture.  Continue current antibiotics.  No fever.  No leukocytosis.  3. Rheumatoid arthritis Currently holding all medication. Continue to hold methotrexate due to thrombocytopenia. Continue to hold Simponi until he has finished his antibiotics. Continue to hold Plaquenil for now Continue prednisone.  4.  Type 2 diabetes mellitus uncontrolled with hyperglycemia and hypoglycemia without any complication Currently on sliding scale insulin, regimen adjusted  5.  Permanent A. fib On anticoagulation with Eliquis.  Rate controlled.  Without any medication.  6.  Chronic HFpEF Patient was on 60 mg of oral Lasix. Echocardiogram negative for any acute abnormality per Echocardiogram with bubble study also negative for any intra-chamber shunt. Resume oral Lasix  7. Acute kidney injury/hyponatremia Likely due to septic shock/hypovolemia/ATN.  Improved.  Monitor sodium levels.  8. Normocytic anemia No evidence of overt bleeding. Currently stable.  9. Thrombocytopenia Platelet count noted to be slowly trending downwards.  Possibly due to sepsis.  Patient is on apixaban. Medications reviewed.  For now we will hold his hydroxychloroquine. Peripheral smear negative for any schistocytes or acute abnormality. Cefazolin can also cause thrombocytopenia.  Currently on vancomycin Currently improving significantly after changing the antibiotics.  10. Toxic metabolic encephalopathy In the setting of sepsis, renal failure, hypoxemia ICU stay.  Improving.  11.  Poor p.o. intake. Dietary recommended cortrak.  PCCM ordered. Unable to insert it  without agitation and therefore patient currently refusing it. Monitor. Body mass index is 28.32 kg/m.   Malnutrition Type: Nutrition  Problem: Inadequate oral intake Etiology: decreased appetite,acute illness Nutrition Interventions: Interventions: Tube feeding,Liberalize Diet   12.  Medial sacral DTI. POA Continue foam dressing  Pressure Injury 02/09/20 Sacrum Medial Stage 2 -  Partial thickness loss of dermis presenting as a shallow open injury with a red, pink wound bed without slough. (Active)  02/09/20 0900  Location: Sacrum  Location Orientation: Medial  Staging: Stage 2 -  Partial thickness loss of dermis presenting as a shallow open injury with a red, pink wound bed without slough.  Wound Description (Comments):   Present on Admission: Yes   13.  Constipation. Initiate bowel regimen.  Monitor.  X-ray abdomen negative for any acute intra-abdominal pathology.  Diet: Dysphagia 3 diet DVT Prophylaxis:    apixaban (ELIQUIS) tablet 5 mg    Advance goals of care discussion: Full code  Family Communication: no family was present at bedside, at the time of interview.  Discussed on the phone with daughter on 1/29  Disposition:  Status is: Inpatient  Remains inpatient appropriate because:Inpatient level of care appropriate due to severity of illness   Dispo: The patient is from: Home              Anticipated d/c is to: CIR at [facility]              Anticipated d/c date is: 3 days              Patient currently is not medically stable to d/c.  Subjective: No nausea no vomiting but no fever, reports shaking chills.  No cough.  No abdominal pain.  No back pain.  Physical Exam:  General: Appear in mild distress, no Rash; Oral Mucosa Clear, moist. no Abnormal Neck Mass Or lumps, Conjunctiva normal  Cardiovascular: S1 and S2 Present, no Murmur, Respiratory: increased respiratory effort, Bilateral Air entry present and bilateral  Crackles, no wheezes Abdomen: Bowel Sound  present, Soft and no tenderness Extremities: trace Pedal edema Neurology: alert and oriented to time, place, and person affect appropriate. no new focal deficit Gait not checked due to patient safety concerns   Vitals:   02/18/20 0725 02/18/20 1257 02/18/20 1338 02/18/20 1652  BP: 110/62 (!) 110/58 108/64 112/63  Pulse: 89 (!) 110 (!) 105 98  Resp: (!) 25 (!) 27 (!) 26 (!) 29  Temp: 97.9 F (36.6 C) (!) 97.5 F (36.4 C) 97.7 F (36.5 C) 98.7 F (37.1 C)  TempSrc: Oral Oral Oral Oral  SpO2: 91% 94% 95% 93%  Weight:      Height:        Intake/Output Summary (Last 24 hours) at 02/18/2020 1906 Last data filed at 02/18/2020 1124 Gross per 24 hour  Intake 937 ml  Output 1495 ml  Net -558 ml   Filed Weights   02/03/20 0410 02/09/20 0915 02/12/20 0412  Weight: 72.6 kg 77.5 kg 79.6 kg    Data Reviewed: I have personally reviewed and interpreted daily labs, tele strips, imaging. I reviewed all nursing notes, pharmacy notes, vitals, pertinent old records I have discussed plan of care as described above with RN and patient/family.  CBC: Recent Labs  Lab 02/14/20 0307 02/15/20 0756 02/16/20 0204 02/17/20 0421 02/18/20 0345  WBC 8.8 6.9 7.3 7.1 7.1  HGB 10.9* 10.6* 11.3* 10.1* 11.2*  HCT 32.1* 33.1* 32.6* 29.8* 34.7*  MCV 92.8 94.8 90.8 92.8 93.8  PLT 64* 75* 80* 96* 127*   Basic Metabolic Panel: Recent Labs  Lab 02/13/20 0843 02/14/20 0307 02/15/20 0756  02/16/20 0204 02/17/20 0421 02/18/20 0345  NA 131* 133* 133* 131* 131*  --   K 4.0 3.6 4.3 4.1 3.7  --   CL 104 105 104 102 102  --   CO2 18* 18* 20* 20* 21*  --   GLUCOSE 58* 85 123* 84 86  --   BUN 18 18 16 13  7*  --   CREATININE 0.70 0.70 0.59* 0.53* 0.62  --   CALCIUM 7.5* 7.6* 7.3* 7.5* 7.7*  --   MG 2.0 2.0 2.0 1.9 1.7 1.8    Studies: DG CHEST PORT 1 VIEW  Result Date: 02/18/2020 CLINICAL DATA:  COVID-19 pneumonia with hypoxia, type II diabetes mellitus, atrial fibrillation, cough and shortness of  breath EXAM: PORTABLE CHEST 1 VIEW COMPARISON:  Portable exam 0930 hours compared to 02/16/2020 FINDINGS: RIGHT arm PICC line tip projects over SVC. Upper normal size of cardiac silhouette. Stable mediastinal contours. Extensive BILATERAL airspace infiltrates consistent with multifocal pneumonia, greater on LEFT, minimally increased in RIGHT upper lobe. Question small RIGHT pleural effusion. No pneumothorax. Bones demineralized. IMPRESSION: BILATERAL pulmonary infiltrates LEFT greater than RIGHT consistent with multifocal pneumonia and COVID-19, minimally increased in RIGHT upper lobe. Electronically Signed   By: 02/18/2020 M.D.   On: 02/18/2020 11:45    Scheduled Meds: . (feeding supplement) PROSource Plus  30 mL Oral TID WC  . apixaban  5 mg Oral BID  . vitamin C  500 mg Oral Daily  . calcium carbonate  1,250 mg Oral Q breakfast  . chlorhexidine  15 mL Mouth Rinse BID  . Chlorhexidine Gluconate Cloth  6 each Topical Daily  . docusate sodium  100 mg Oral BID  . feeding supplement  1 Container Oral QID  . folic acid  1 mg Oral Daily  . furosemide  40 mg Oral Daily  . gabapentin  100 mg Oral QHS  . insulin aspart  0-9 Units Subcutaneous TID WC  . lactulose  20 g Oral BID  . lidocaine  1 patch Transdermal Q24H  . mouth rinse  15 mL Mouth Rinse BID  . pantoprazole  40 mg Oral Daily  . polyethylene glycol  17 g Oral Daily  . pravastatin  80 mg Oral q1800  . predniSONE  10 mg Oral Q breakfast   Followed by  . [START ON 02/23/2020] predniSONE  5 mg Oral Q breakfast  . vitamin B-12  1,000 mcg Oral Daily  . zinc sulfate  220 mg Oral Daily   Continuous Infusions: . sodium chloride 10 mL/hr at 02/18/20 0414  . vancomycin 1,000 mg (02/18/20 1717)   PRN Meds: acetaminophen **OR** acetaminophen, lip balm, methocarbamol, morphine injection, oxyCODONE, phenol, senna-docusate, sodium chloride flush  Time spent: 35 minutes  Author: 02/20/20, MD [facility] 02/18/2020 7:06 PM  To reach  On-call, see care teams to locate the attending and reach out via [email or web address]. Between 7PM-7AM, please contact night-coverage If you still have difficulty reaching the attending provider, please page the [facility] (Director on Call) for [facility] on amion for assistance.

## 2020-02-19 DIAGNOSIS — U071 COVID-19: Secondary | ICD-10-CM | POA: Diagnosis not present

## 2020-02-19 DIAGNOSIS — I2510 Atherosclerotic heart disease of native coronary artery without angina pectoris: Secondary | ICD-10-CM | POA: Diagnosis not present

## 2020-02-19 DIAGNOSIS — I480 Paroxysmal atrial fibrillation: Secondary | ICD-10-CM | POA: Diagnosis not present

## 2020-02-19 DIAGNOSIS — J189 Pneumonia, unspecified organism: Secondary | ICD-10-CM | POA: Diagnosis not present

## 2020-02-19 LAB — GLUCOSE, CAPILLARY
Glucose-Capillary: 112 mg/dL — ABNORMAL HIGH (ref 70–99)
Glucose-Capillary: 165 mg/dL — ABNORMAL HIGH (ref 70–99)
Glucose-Capillary: 194 mg/dL — ABNORMAL HIGH (ref 70–99)
Glucose-Capillary: 128 mg/dL — ABNORMAL HIGH (ref 70–99)

## 2020-02-19 LAB — MAGNESIUM: Magnesium: 1.7 mg/dL (ref 1.7–2.4)

## 2020-02-19 LAB — C-REACTIVE PROTEIN: CRP: 20.4 mg/dL — ABNORMAL HIGH (ref <1.0)

## 2020-02-19 NOTE — Progress Notes (Signed)
[facility] Progress Note  Patient: Javier Beard    NIO:[id number]  DOA: 2020-02-21     Date of Service: the patient was seen and examined on 02/19/2020  Brief hospital course: Possible due to history of rheumatoid arthritis, type II DM, A. fib, chronic HFpEF.  Presents with complaints of cough and shortness of breath.  Found to have COVID-19 pneumonia with hypoxia  Currently plan is continue supportive care for hypoxia, monitor for improvement in oral intake  Assessment and Plan: 1. Acute Hypoxic respiratory failure, POA, 92 % on room air on admission.  Acute COVID-19 Viral Pneumonia CXR: hazy bilateral peripheral opacities CTA chest PE protocol 1/14; negative for PE Oxygen requirement: At rest 3 LPM right now.  Somewhat respiratory distress.  On exertion 82% with dizziness and blurry vision on 8 LPM. CRP: Trending up right now. Remdesivir: Completed on 1/11 Steroids: On prednisone taper Baricitinib/Actemra: Not initiated,not indicated. Antibiotics: Initiated on 02/09/2020 DVT Prophylaxis:  apixaban (ELIQUIS) tablet 5 mg  Prone positioning and incentive spirometer use recommended.  Overall plan: Continue supportive measures for COVID-19 pneumonia and monitor for complications Currently out of isolation.   The treatment plan and use of medications and known side effects were discussed with patient/family. It was clearly explained that complete risks and long-term side effects are unknown. Patient/family agree with the treatment plan.    2.    Septic shock, not POA with MSSA bacteremia and MSSA pneumonia. L4-L5 septic arthritis. Paraspinous musculature myositis. 2.9 cm soft tissue abscess. Epidural phlegmon without frank epidural abscess. Patient's oxygen condition worsen on 1/21 during transfer to ICU. Blood cultures came back positive for MSSA. Started on IV cefazolin. Currently ID following. Echocardiogram negative for vegetation. Recommend TEE when appropriate from  cardiology perspective. MRI C-spine T-spine negative for acute infection. MRI L-spine concerning for septic arthritis. 6 weeks of IV antibiotics recommended. PICC line inserted. Patient would require a repeat MRI L-spine to ensure resolution of the arthritis. IV cefazolin changed to IV vancomycin in the setting of thrombocytopenia. Reported shaking chills on 1/30.  Unchanged chest x-ray, no growth on blood culture.  Continue current antibiotics.  No fever.  No leukocytosis.  3. Rheumatoid arthritis Currently holding all medication. Continue to hold methotrexate due to thrombocytopenia. Continue to hold Simponi until he has finished his antibiotics. Continue to hold Plaquenil for now Continue prednisone.  4.  Type 2 diabetes mellitus uncontrolled with hyperglycemia and hypoglycemia without any complication Currently on sliding scale insulin, regimen adjusted  5.  Permanent A. fib On anticoagulation with Eliquis.  Rate controlled.  Without any medication.  6.  Chronic HFpEF Patient was on 60 mg of oral Lasix. Echocardiogram negative for any acute abnormality per Echocardiogram with bubble study also negative for any intra-chamber shunt. Resume oral Lasix  7. Acute kidney injury/hyponatremia Likely due to septic shock/hypovolemia/ATN.  Improved.  Monitor sodium levels.  8. Normocytic anemia No evidence of overt bleeding. Currently stable.  9. Thrombocytopenia Platelet count noted to be slowly trending downwards.  Possibly due to sepsis.  Patient is on apixaban. Medications reviewed.  For now we will hold his hydroxychloroquine. Peripheral smear negative for any schistocytes or acute abnormality. Cefazolin can also cause thrombocytopenia.  Currently on vancomycin Currently improving significantly after changing the antibiotics.  10. Toxic metabolic encephalopathy In the setting of sepsis, renal failure, hypoxemia ICU stay.  Improving.  11.  Poor p.o. intake. Dietary  recommended cortrak.  PCCM ordered. Unable to insert it without agitation and therefore patient currently refusing  it. Monitor. Body mass index is 28.32 kg/m.   Malnutrition Type: Nutrition Problem: Inadequate oral intake Etiology: decreased appetite,acute illness Nutrition Interventions: Interventions: Tube feeding,Liberalize Diet   12.  Medial sacral DTI. POA Continue foam dressing  Pressure Injury 02/09/20 Sacrum Medial Stage 2 -  Partial thickness loss of dermis presenting as a shallow open injury with a red, pink wound bed without slough. (Active)  02/09/20 0900  Location: Sacrum  Location Orientation: Medial  Staging: Stage 2 -  Partial thickness loss of dermis presenting as a shallow open injury with a red, pink wound bed without slough.  Wound Description (Comments):   Present on Admission: Yes   13.  Constipation. Initiate bowel regimen.  Monitor.  X-ray abdomen negative for any acute intra-abdominal pathology.  14.  Severe deconditioning. Unable to work with physical therapy due to severe deconditioning as well as pain. On top of no p.o. intake. Continue PT.  15.  Goals of care conversation. Prognosis very poor given patient's multiple comorbidities, rheumatoid arthritis, COVID-19 infection as well as MSSA endocarditis and septic arthritis being the main.  On top of that patient has poor p.o. intake throughout his 24-day hospitalization.  Currently significantly weak and unable to work physical therapy. Patient's only desire is to go home. I am not sure whether patient can go home anytime given the goal of treating treatable conditions. In the best case scenario patient will likely be able to discharge to SNF and may have frequent readmissions. Recommended family to consider home hospice as an option. Family meeting tomorrow.  16.  Difficulty swallowing secondary to deconditioning. Speech therapy consulted. Recommended dysphagia 3 diet.  Diet: Dysphagia 3  diet DVT Prophylaxis:    apixaban (ELIQUIS) tablet 5 mg    Advance goals of care discussion: DNR/DNI  Family Communication: no family was present at bedside, at the time of interview.  Discussed on the phone with daughter on 1/31  Disposition:  Status is: Inpatient  Remains inpatient appropriate because: 0% p.o. intake and inability to work with physical therapy with goal of treating what is treatable conditions and severe hypoxia on exertion.  Dispo: The patient is from: Home              Anticipated d/c is to: to be determined               Anticipated d/c date is: 3 days              Patient currently is not medically stable to d/c.  Subjective: No nausea no vomiting.  No fever no chills.  No chest pain.  No abdominal pain.  Continues to have shortness of breath continues to have back pain.  Does have cough.  Reports blurriness and double vision when doing exertion.  Physical Exam:  General: Appear in mild distress, no Rash; Oral Mucosa Clear, moist. no Abnormal Neck Mass Or lumps, Conjunctiva normal  Cardiovascular: S1 and S2 Present, no Murmur, Respiratory: increased respiratory effort, Bilateral Air entry present and bilateral  Crackles, no wheezes Abdomen: Bowel Sound present, Soft and no tenderness Extremities: trace Pedal edema Neurology: alert and oriented to time, place, and person affect appropriate. no new focal deficit Gait not checked due to patient safety concerns  Vitals:   02/19/20 0358 02/19/20 0807 02/19/20 1236 02/19/20 1811  BP: 108/71 125/69 102/71 122/69  Pulse: 91 100 99 100  Resp: (!) Temp: (!) 97.4 F (36.3 C) 97.8 F (36.6 C) 97.8 F (  36.6 C) 97.8 F (36.6 C)  TempSrc: Oral Oral Oral Oral  SpO2: 98% 92% 95% 96%  Weight:      Height:        Intake/Output Summary (Last 24 hours) at 02/19/2020 2045 Last data filed at 02/19/2020 1517 Gross per 24 hour  Intake 1097.53 ml  Output -  Net 1097.53 ml   Filed Weights   02/03/20  0410 02/09/20 0915 02/12/20 0412  Weight: 72.6 kg 77.5 kg 79.6 kg    Data Reviewed: I have personally reviewed and interpreted daily labs, tele strips, imaging. I reviewed all nursing notes, pharmacy notes, vitals, pertinent old records I have discussed plan of care as described above with RN and patient/family.  CBC: Recent Labs  Lab 02/14/20 0307 02/15/20 0756 02/16/20 0204 02/17/20 0421 02/18/20 0345  WBC 8.8 6.9 7.3 7.1 7.1  HGB 10.9* 10.6* 11.3* 10.1* 11.2*  HCT 32.1* 33.1* 32.6* 29.8* 34.7*  MCV 92.8 94.8 90.8 92.8 93.8  PLT 64* 75* 80* 96* 127*   Basic Metabolic Panel: Recent Labs  Lab 02/13/20 0843 02/14/20 0307 02/15/20 0756 02/16/20 0204 02/17/20 0421 02/18/20 0345 02/19/20 0356  NA 131* 133* 133* 131* 131*  --   --   K 4.0 3.6 4.3 4.1 3.7  --   --   CL 104 105 104 102 102  --   --   CO2 18* 18* 20* 20* 21*  --   --   GLUCOSE 58* 85 123* 84 86  --   --   BUN 18 18 16 13  7*  --   --   CREATININE 0.70 0.70 0.59* 0.53* 0.62  --   --   CALCIUM 7.5* 7.6* 7.3* 7.5* 7.7*  --   --   MG 2.0 2.0 2.0 1.9 1.7 1.8 1.7    Studies: No results found.  Scheduled Meds: . (feeding supplement) PROSource Plus  30 mL Oral TID WC  . apixaban  5 mg Oral BID  . vitamin C  500 mg Oral Daily  . calcium carbonate  1,250 mg Oral Q breakfast  . chlorhexidine  15 mL Mouth Rinse BID  . Chlorhexidine Gluconate Cloth  6 each Topical Daily  . docusate sodium  100 mg Oral BID  . feeding supplement  1 Container Oral QID  . folic acid  1 mg Oral Daily  . furosemide  40 mg Oral Daily  . gabapentin  100 mg Oral QHS  . insulin aspart  0-9 Units Subcutaneous TID WC  . lactulose  20 g Oral BID  . lidocaine  1 patch Transdermal Q24H  . mouth rinse  15 mL Mouth Rinse BID  . pantoprazole  40 mg Oral Daily  . polyethylene glycol  17 g Oral Daily  . pravastatin  80 mg Oral q1800  . predniSONE  10 mg Oral Q breakfast   Followed by  . [START ON 02/23/2020] predniSONE  5 mg Oral Q breakfast   . vitamin B-12  1,000 mcg Oral Daily  . zinc sulfate  220 mg Oral Daily   Continuous Infusions: . sodium chloride Stopped (02/18/20 0622)  . vancomycin 1,000 mg (02/19/20 1824)   PRN Meds: acetaminophen **OR** acetaminophen, lip balm, methocarbamol, morphine injection, oxyCODONE, phenol, senna-docusate, sodium chloride flush  Time spent: 35 minutes  Author: 02/21/20, MD [facility] 02/19/2020 8:45 PM  To reach On-call, see care teams to locate the attending and reach out via [email or web address]. Between 7PM-7AM, please contact night-coverage If you still have  difficulty reaching the attending provider, please page the [facility] (Director on Call) for [facility] on amion for assistance.

## 2020-02-19 NOTE — TOC Progression Note (Addendum)
Transition of Care [facility]) - Progression Note    Patient Details  Name: Javier Beard MRN: [id number] Date of Birth: 1940-12-02  [facility] CM/SW Contact  Mearl Latin, LCSW Phone Number: 02/19/2020, 11:27 AM  Clinical Narrative:    11:27am-CSW received call from [facility]. They are unable to accept patient as they feel he needs to be "close to [facility] physicians to follow up with his spinal abscess". Will follow up with MD.   3pm-Patient's daughter requesting CIR and [facility] IR review patient as she is still very concerned about patient not agreeing to SNF and wanting to go home. Both IR following patient for progress.   Expected Discharge Plan: Home w Home Health Services Barriers to Discharge: Continued Medical Work up  Expected Discharge Plan and Services Expected Discharge Plan: Home w Home Health Services In-house Referral: Clinical Social Work Discharge Planning Services: CM Consult Post Acute Care Choice: Home Health Living arrangements for the past 2 months: Single Family Home Expected Discharge Date: 02/01/20                                     Social Determinants of Health (SDOH) Interventions    Readmission Risk Interventions No flowsheet data found.

## 2020-02-19 NOTE — Progress Notes (Signed)
Pharmacy Antibiotic Note  Javier Beard is a 80 y.o. male admitted on 01/24/2020 with MSSA bacteremia.  Pharmacy has been consulted for vancomycin dosing. Patient was previously on cefazolin for his MSSA bacteremia, however he has developed thrombocytopenia without an obvious cause. Cefazolin has the potential to cause this in patients so the ID team is recommending changing to vancomycin to see if it will help his platelet recovery  Vancomycin levels obtained at [facility]. Peak 36, trough 18 (drawn 3 hours early TT 13). Current AUC at 1000 mg q12h is 563, within the goal range of 400-600. Continue current dose, scr remains stable. OPAT orders have been placed   Pt remains stable on abx. Stop date added to vanc (3/3). We will continue to monitor bmet   Plan: Continue Vancomycin 1000mg  IV q12h through 03/21/2020 Levels as indicated while inpatient  Bmet q48  Height: 5\' 6"  (167.6 cm) Weight: 79.6 kg (175 lb 7.8 oz) IBW/kg (Calculated) : 63.8  Temp (24hrs), Avg:97.7 F (36.5 C), Min:97.4 F (36.3 C), Max:98.7 F (37.1 C)  Recent Labs  Lab 02/13/20 0843 02/13/20 1310 02/14/20 0307 02/15/20 0756 02/15/20 1908 02/16/20 0204 02/16/20 1107 02/17/20 0421 02/18/20 0345  WBC  --    < > 8.8 6.9  --  7.3  --  7.1 7.1  CREATININE 0.70  --  0.70 0.59*  --  0.53*  --  0.62  --   LATICACIDVEN  --   --   --   --   --   --  1.9  --   --   VANCOTROUGH  --   --   --   --   --  18  --   --   --   VANCOPEAK  --   --   --   --  36  --   --   --   --    < > = values in this interval not displayed.    Estimated Creatinine Clearance: 76.7 mL/min (by C-G formula based on SCr of 0.62 mg/dL).    Allergies  Allergen Reactions  . Cefazolin Other (See Comments)    thrombotopenia    Antimicrobials this admission: 1/21 linezolid x1 1/21 cefepime x1 1/22 cefazolin >>1/25 1/25 vancomycin >>3/3   Microbiology results: 1/21 BCx: MSSA 1/22 BCx: staph captis 1/25 BCx ngtd  2/22, PharmD, BCIDP, AAHIVP,  CPP Infectious Disease Pharmacist 02/19/2020 2:51 PM

## 2020-02-19 NOTE — Progress Notes (Signed)
Physical Therapy Treatment Patient Details Name: Javier Beard MRN: [id number] DOB: 03/30/1940 Today's Date: 02/19/2020    History of Present Illness Pt is a 80 y.o. male admitted 2020/02/19 with worsening SOB, nausea/vomitting. Workup for acute hypoxic respiratory failure due to COVID-19 PNA. CTA 1/14 negative for PE. Pt with worsening respiratory status and AMS early AM 1/21; head CT negative for acute abnormality. PMH includes RA, DM2, afib, HF.    PT Comments    Pt with increasing O2 demand today especially with mobility. On entry pt in agreement with therapy to get to chair, however pt limited by increased O2 demand, tachycardia, and tachypnea in there presence of increased low back pain in sitting. Pt is modA with getting to EoB, once EoB pt with increased difficulty gaining seated balance. Pt with increased posterior lean. Physician in room auscultated lungs and reported continued congestion and need to get OOB. Tried to advance to transfer to chair, when asked to open eyes and look at recliner pt states "Which one?" and endorses increase dizziness with his eyes open. With attempt to stand pt with increased cardiovascular response, at which point pt begins retropulsion back into bed. Provided maximal vc for not pushing straight back perpendicular to bed. Ultimately requires total A for return to bed. Pt will need improved cardiopulmonary response and decreased pain to advance mobility. Pt and family still endorse desire for CIR placement, however may be difficult given medical condition. PT will continue to follow acutely.   Follow Up Recommendations  CIR     Equipment Recommendations  Rolling walker with 5" wheels       Precautions / Restrictions Precautions Precautions: Fall;Other (comment) Precaution Comments: watch SpO2 Restrictions Weight Bearing Restrictions: No    Mobility  Bed Mobility Overal bed mobility: Needs Assistance Bed Mobility: Supine to Sit;Sit to Supine      Supine to sit: Mod assist Sit to supine: Total assist   General bed mobility comments: pt able to bring LE off bed requires modA for bringing trunk to upright, total A for return to bed due to fatigue, increased O2 demand, as well as low back pain.  Transfers       Sit to Stand: Total assist         General transfer comment: attempted sit to stand however pt with increased O2 demand and retropulsion when provided assist     Balance Overall balance assessment: Needs assistance Sitting-balance support: Feet supported;Bilateral upper extremity supported Sitting balance-Leahy Scale: Poor Sitting balance - Comments: unable to attain unsupported seated balance in sitting, pt with increased posterior lean.                                    Cognition Arousal/Alertness: Awake/alert Behavior During Therapy: Flat affect Overall Cognitive Status: Impaired/Different from baseline Area of Impairment: Memory                   Current Attention Level: Selective Memory: Decreased short-term memory Following Commands: Follows one step commands with increased time   Awareness: Emergent Problem Solving: Slow processing;Difficulty sequencing;Requires verbal cues;Requires tactile cues;Decreased initiation General Comments: pt requires maximal cuing for tasks at hand, made more difficult by decreased vision      Exercises      General Comments General comments (skin integrity, edema, etc.): Pt on 5L O2 via [place] at rest with SaO2 86%O2, increased to 8L O2 in anticipation on drop  in O2 with mobility. Pt able to attain SaO2 89%O2 at rest, with mobility pt SaO2 dropped to 83%O2, pt tachycardic and tachypnic with attempt to stand max noted HR 155bpm, RR 55, with return to supine RN placed salter HFNC at 10L and SaO2 87%O2, pt with hypoxic diploplia which increases pt difficulty with moving to chair, pt preference is eyes closed during PT,      Pertinent Vitals/Pain Pain  Assessment: Faces Faces Pain Scale: Hurts even more Pain Location: back Pain Descriptors / Indicators: Grimacing;Guarding Pain Intervention(s): Monitored during session;Repositioned           PT Goals (current goals can now be found in the care plan section) Acute Rehab PT Goals Patient Stated Goal: get out of bed PT Goal Formulation: With patient Time For Goal Achievement: 02/26/20 Potential to Achieve Goals: Fair Progress towards PT goals: Not progressing toward goals - comment    Frequency    Min 2X/week      PT Plan Frequency needs to be updated       AM-PAC PT "6 Clicks" Mobility   Outcome Measure  Help needed turning from your back to your side while in a flat bed without using bedrails?: A Lot Help needed moving from lying on your back to sitting on the side of a flat bed without using bedrails?: A Lot Help needed moving to and from a bed to a chair (including a wheelchair)?: Total Help needed standing up from a chair using your arms (e.g., wheelchair or bedside chair)?: Total Help needed to walk in hospital room?: Total Help needed climbing 3-5 steps with a railing? : Total 6 Click Score: 8    End of Session Equipment Utilized During Treatment: Oxygen Activity Tolerance: Patient limited by fatigue;Patient limited by pain (onset of back pain with sitting EoB) Patient left: with call bell/phone within reach;in bed;with bed alarm set Nurse Communication: Mobility status PT Visit Diagnosis: Unsteadiness on feet (R26.81);Other abnormalities of gait and mobility (R26.89)     Time: 4765-4650 PT Time Calculation (min) (ACUTE ONLY): 28 min  Charges:  $Therapeutic Activity: 23-37 mins                     Ajanay Farve B. Beverely Risen PT, DPT Acute Rehabilitation Services Pager [phone] Office [phone]    [name] Fleet 02/19/2020, 12:13 PM

## 2020-02-19 NOTE — Progress Notes (Addendum)
Inpatient Rehabilitation Admissions Coordinator  Inpatient rehab consult received. I have reviewed EPIC chart . I spoke with his daughter by phone to discuss goals and expectations of a possible Cir admit. Currently patient not at a level to tolerate the intensity required of an inpt rehab admit. Pain limitations with his feet, which his daughter describes as he is in need of his RA meds and methotrexate. O2 limitations today also with therapy. 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 I will follow his progress.  Ottie Glazier, RN, MSN Rehab Admissions Coordinator [phone] 02/19/2020 2:31 PM

## 2020-02-20 ENCOUNTER — Inpatient Hospital Stay (HOSPITAL_COMMUNITY): Payer: Medicare Other

## 2020-02-20 DIAGNOSIS — I480 Paroxysmal atrial fibrillation: Secondary | ICD-10-CM | POA: Diagnosis not present

## 2020-02-20 DIAGNOSIS — U071 COVID-19: Secondary | ICD-10-CM | POA: Diagnosis not present

## 2020-02-20 DIAGNOSIS — I2510 Atherosclerotic heart disease of native coronary artery without angina pectoris: Secondary | ICD-10-CM | POA: Diagnosis not present

## 2020-02-20 DIAGNOSIS — J189 Pneumonia, unspecified organism: Secondary | ICD-10-CM | POA: Diagnosis not present

## 2020-02-20 LAB — URINE CULTURE: Culture: NO GROWTH

## 2020-02-20 LAB — GLUCOSE, CAPILLARY
Glucose-Capillary: 115 mg/dL — ABNORMAL HIGH (ref 70–99)
Glucose-Capillary: 191 mg/dL — ABNORMAL HIGH (ref 70–99)
Glucose-Capillary: 175 mg/dL — ABNORMAL HIGH (ref 70–99)
Glucose-Capillary: 155 mg/dL — ABNORMAL HIGH (ref 70–99)

## 2020-02-20 LAB — CBC
HCT: 29.1 % — ABNORMAL LOW (ref 39.0–52.0)
Hemoglobin: 9.4 g/dL — ABNORMAL LOW (ref 13.0–17.0)
MCH: 30.3 pg (ref 26.0–34.0)
MCHC: 32.3 g/dL (ref 30.0–36.0)
MCV: 93.9 fL (ref 80.0–100.0)
Platelets: 144 10*3/uL — ABNORMAL LOW (ref 150–400)
RBC: 3.1 MIL/uL — ABNORMAL LOW (ref 4.22–5.81)
RDW: 15.4 % (ref 11.5–15.5)
WBC: 7.5 10*3/uL (ref 4.0–10.5)
nRBC: 0 % (ref 0.0–0.2)

## 2020-02-20 LAB — BASIC METABOLIC PANEL
Anion gap: 10 (ref 5–15)
BUN: 18 mg/dL (ref 8–23)
CO2: 23 mmol/L (ref 22–32)
Calcium: 7.6 mg/dL — ABNORMAL LOW (ref 8.9–10.3)
Chloride: 97 mmol/L — ABNORMAL LOW (ref 98–111)
Creatinine, Ser: 1.12 mg/dL (ref 0.61–1.24)
GFR, Estimated: >60 mL/min (ref >60)
Glucose, Bld: 98 mg/dL (ref 70–99)
Potassium: 3.2 mmol/L — ABNORMAL LOW (ref 3.5–5.1)
Sodium: 130 mmol/L — ABNORMAL LOW (ref 135–145)

## 2020-02-20 LAB — C-REACTIVE PROTEIN: CRP: 23.1 mg/dL — ABNORMAL HIGH (ref <1.0)

## 2020-02-20 LAB — MAGNESIUM: Magnesium: 1.8 mg/dL (ref 1.7–2.4)

## 2020-02-20 IMAGING — CT CT CHEST W/O CM
2 of 3 series · 15 of 36 positions shown, 18 images · non-contrast
Comparison: None.

CLINICAL DATA: COVID pneumonia

EXAM:
CT CHEST WITHOUT CONTRAST
TECHNIQUE: Multidetector CT imaging of the chest was performed following the
standard protocol without IV contrast.

[Series 3: chest w/o 2mm st · axial · non-contrast · 0.72mm/px · z∈[+1174,+1408]mm · 12 of 139 slices shown, 15 images]
[im 11/139  mediastinal]
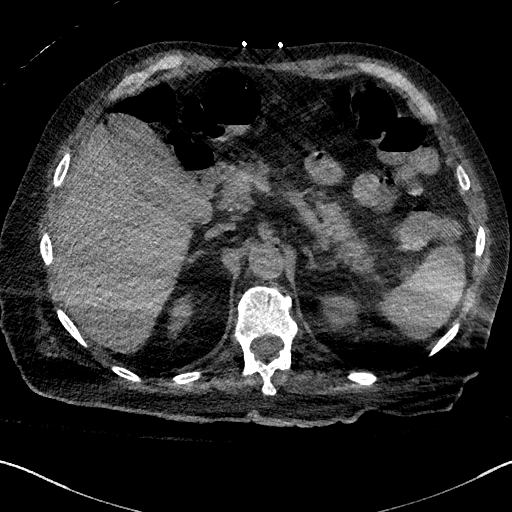
[im 11/139  lung]
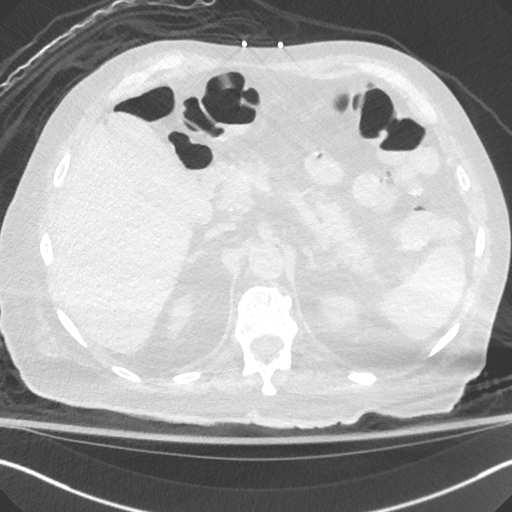
[im 21/139  lung]
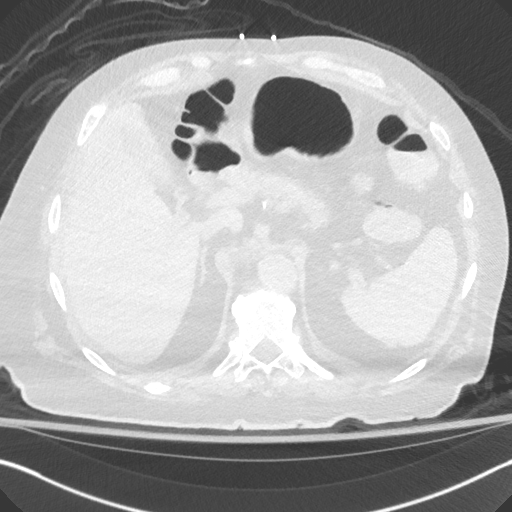
[im 31/139  lung]
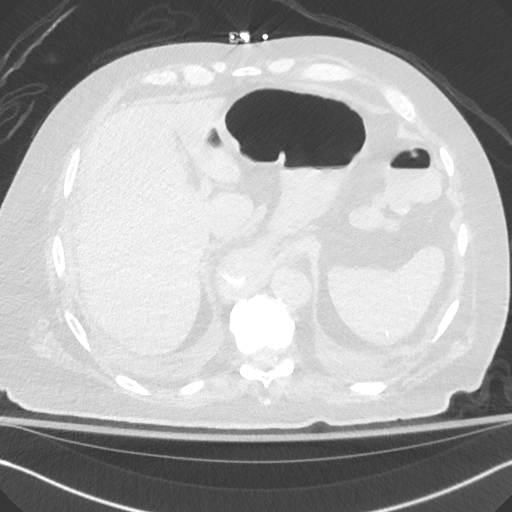
[im 41/139  lung]
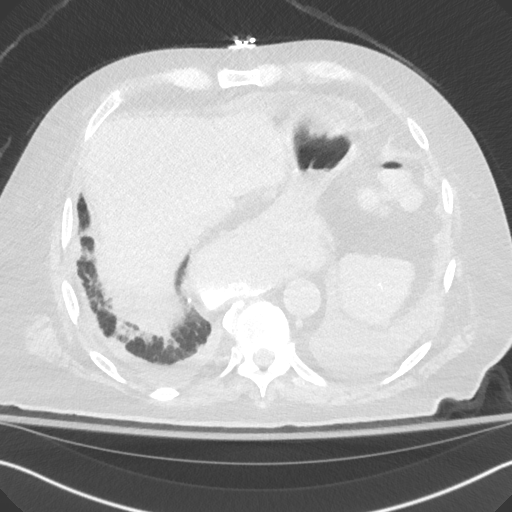
[im 52/139  mediastinal]
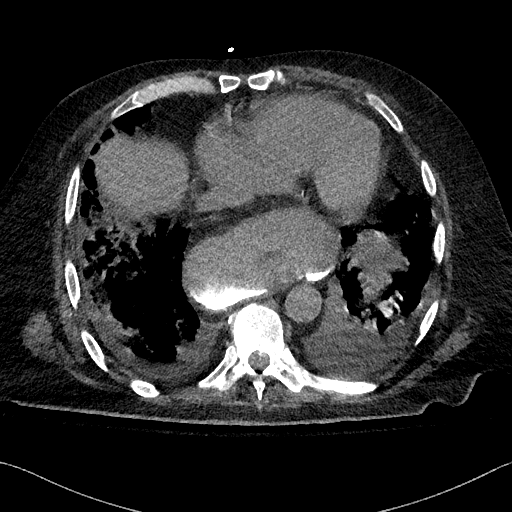
[im 52/139  lung]
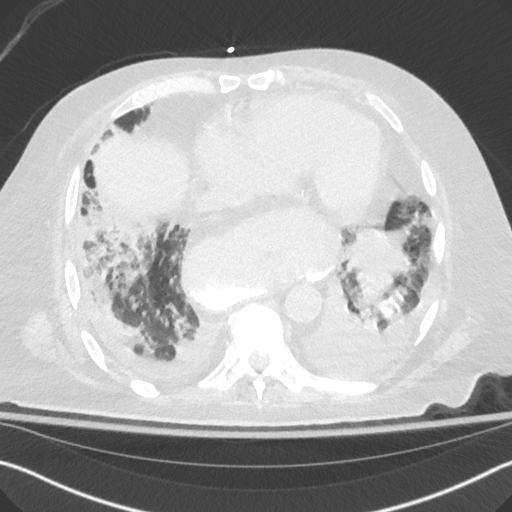
[im 62/139  lung]
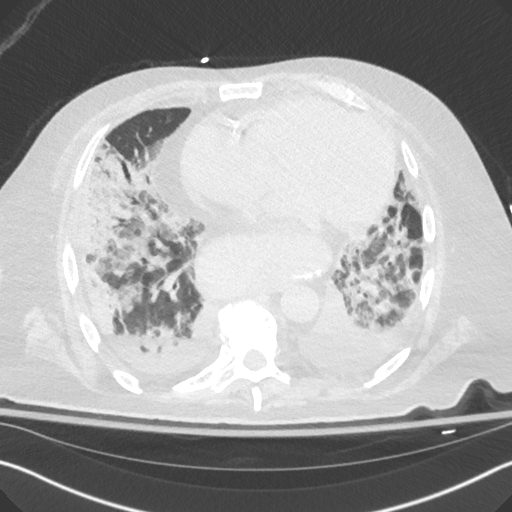
[im 77/139  lung]
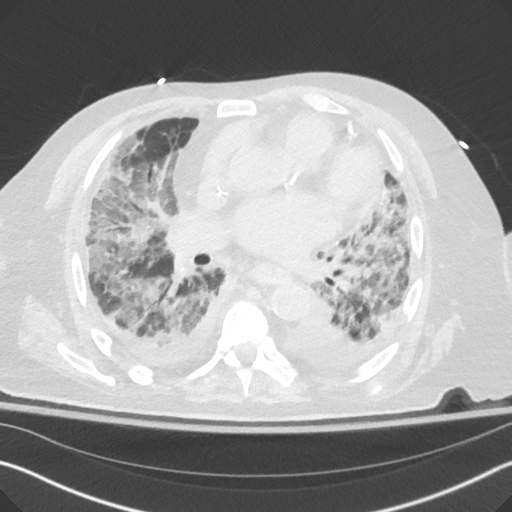
[im 87/139  lung]
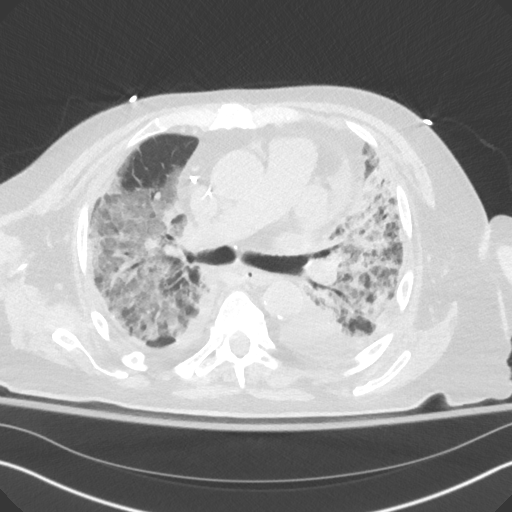
[im 98/139  mediastinal]
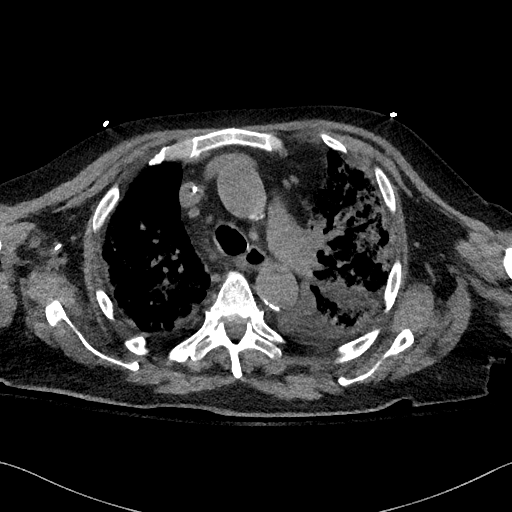
[im 98/139  lung]
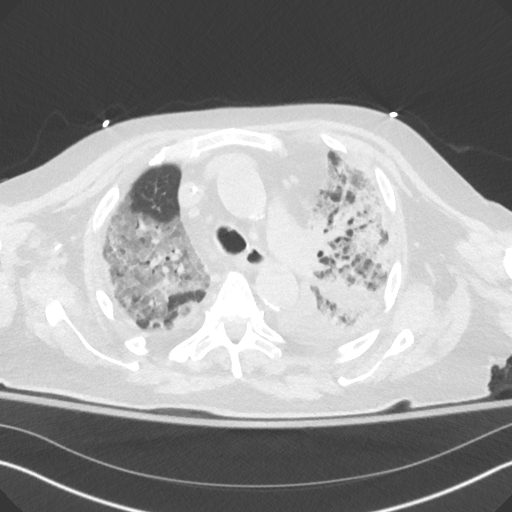
[im 108/139  lung]
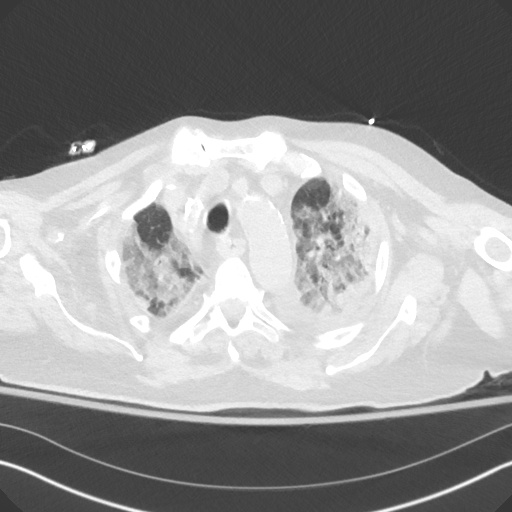
[im 118/139  lung]
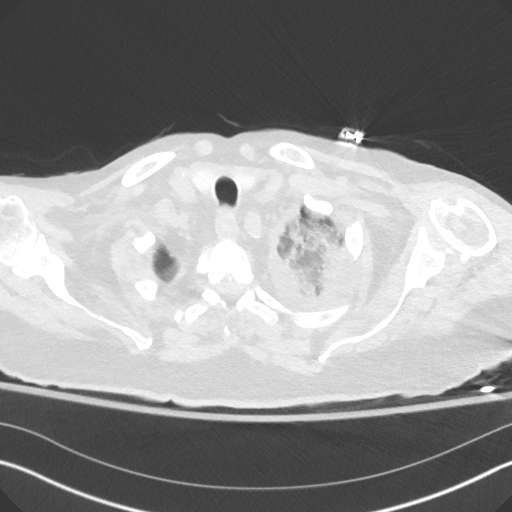
[im 128/139  lung]
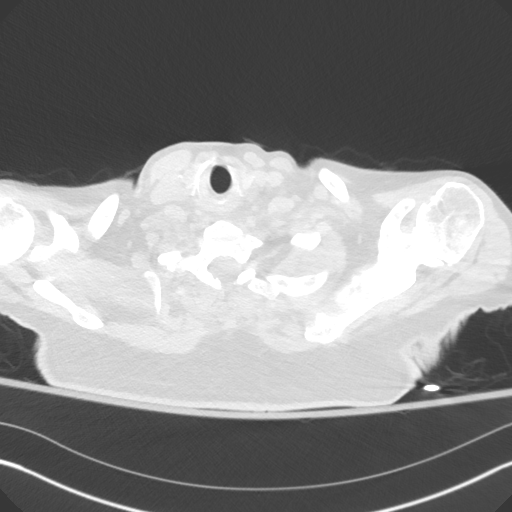

[Series 5: chest w/o 2mm st cor · coronal · non-contrast · 0.59mm/px · 3 of 131 slices shown]
[im 27/131  lung]
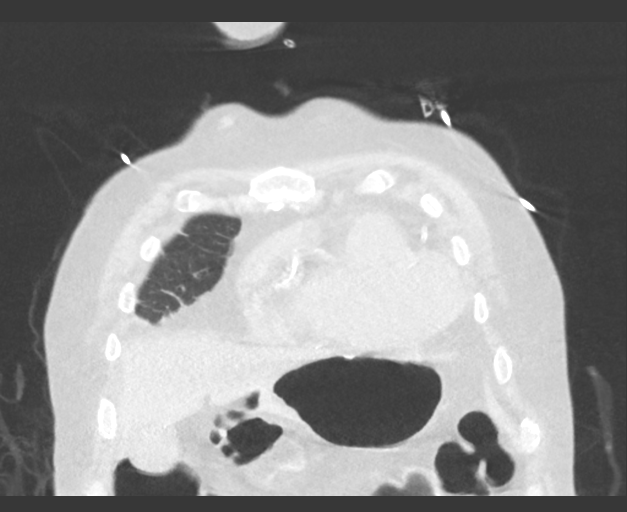
[im 53/131  lung]
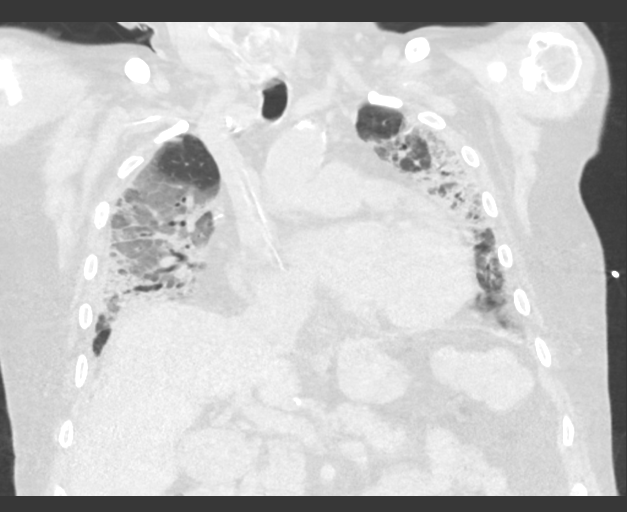
[im 79/131  lung]
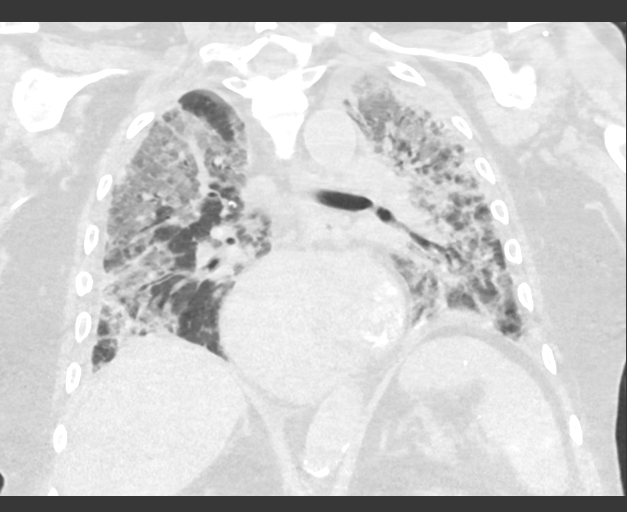

[15 of 36 positions shown; findings below may reference images not displayed]

FINDINGS: Cardiovascular: Right upper extremity PICC, tip position near the
superior cavoatrial junction. Aortic atherosclerosis. Mild
cardiomegaly. Three-vessel coronary artery calcifications and/or
stents. No pericardial effusion.

Mediastinum/Nodes: Multiple benign calcified mediastinal and right
hilar lymph nodes. Large hiatal hernia with intrathoracic position
of the gastric body and fundus. Thyroid gland, trachea, and
esophagus demonstrate no significant findings.

Lungs/Pleura: Very extensive bilateral heterogeneous, ground-glass,
and consolidative airspace disease throughout the lungs. Small
bilateral pleural effusions and associated atelectasis or
consolidation.

Upper Abdomen: No acute abnormality.

Musculoskeletal: No chest wall mass or suspicious bone lesions
identified.
IMPRESSION: 1. Very extensive bilateral heterogeneous, ground-glass, and
consolidative airspace disease throughout the lungs. Small bilateral
pleural effusions and associated atelectasis or consolidation.
Findings are consistent with [HP] pneumonia.
2. Large hiatal hernia with intrathoracic position of the gastric
body and fundus.
3. Coronary artery disease.

Aortic Atherosclerosis ([HP]-[HP]).

## 2020-02-20 MED ORDER — POTASSIUM CHLORIDE CRYS ER 20 MEQ PO TBCR
40.0000 meq | EXTENDED_RELEASE_TABLET | ORAL | Status: AC
  Administered 2020-02-20 (×2): 40 meq via ORAL
  Filled 2020-02-20 (×2): qty 2

## 2020-02-20 MED ORDER — OXYCODONE HCL ER 10 MG PO T12A
10.0000 mg | EXTENDED_RELEASE_TABLET | Freq: Two times a day (BID) | ORAL | Status: DC
  Administered 2020-02-20 – 2020-02-21 (×3): 10 mg via ORAL
  Filled 2020-02-20 (×4): qty 1

## 2020-02-20 MED ORDER — VANCOMYCIN HCL IN DEXTROSE 1-5 GM/200ML-% IV SOLN
1000.0000 mg | INTRAVENOUS | Status: DC
  Administered 2020-02-21 – 2020-02-22 (×2): 1000 mg via INTRAVENOUS
  Filled 2020-02-20 (×2): qty 200

## 2020-02-20 NOTE — Progress Notes (Signed)
[facility] Progress Note  Patient: Javier Beard    WFU:[id number]  DOA: 01/25/2020     Date of Service: the patient was seen and examined on 02/20/2020  Brief hospital course: Possible due to history of rheumatoid arthritis, type II DM, A. fib, chronic HFpEF.  Presents with complaints of cough and shortness of breath.  Found to have COVID-19 pneumonia with hypoxia  Currently plan is treat pain aggressively for the next 48 hours.  If the patient's medical condition worsens doing so daughter potentially agreeable to transition to comfort care.  Assessment and Plan: 1. Acute Hypoxic respiratory failure, POA, 92 % on room air on admission.  Acute COVID-19 Viral Pneumonia CXR: hazy bilateral peripheral opacities CTA chest PE protocol 1/14; negative for PE Oxygen requirement: At rest 3 LPM right now.  Somewhat respiratory distress.  On exertion 82% with dizziness and blurry vision on 8 LPM. CRP: Trending up right now. Remdesivir: Completed on 1/11 Steroids: On prednisone home dose now Baricitinib/Actemra: Not initiated,not indicated. Antibiotics: Initiated on 02/09/2020 DVT Prophylaxis:  apixaban (ELIQUIS) tablet 5 mg  Prone positioning and incentive spirometer use recommended.  Overall plan: Continue supportive measures for COVID-19 pneumonia and monitor for complications Currently out of isolation.   The treatment plan and use of medications and known side effects were discussed with patient/family. It was clearly explained that complete risks and long-term side effects are unknown. Patient/family agree with the treatment plan.    2.    Septic shock, not POA with MSSA bacteremia and MSSA pneumonia. L4-L5 septic arthritis. Paraspinous musculature myositis. 2.9 cm soft tissue abscess. Epidural phlegmon without frank epidural abscess. Patient's oxygen condition worsen on 1/21 during transfer to ICU. Blood cultures came back positive for MSSA. Started on IV cefazolin. Currently  ID following. Echocardiogram negative for vegetation. Recommend TEE when appropriate from cardiology perspective. MRI C-spine T-spine negative for acute infection. MRI L-spine concerning for septic arthritis. 6 weeks of IV antibiotics recommended. PICC line inserted. Patient would require a repeat MRI L-spine to ensure resolution of the arthritis. IV cefazolin changed to IV vancomycin in the setting of thrombocytopenia. Reported shaking chills on 1/30.  Fever on 2/1. CT scan shows persistent Covid pneumonia. Unchanged chest x-ray, no growth on blood culture.  Continue current antibiotics.  3. Rheumatoid arthritis Currently holding all medication. Continue to hold methotrexate due to thrombocytopenia. Continue to hold Simponi until he has finished his antibiotics. Continue to hold Plaquenil for now Continue prednisone.  4.  Type 2 diabetes mellitus uncontrolled with hyperglycemia and hypoglycemia without any complication Currently on sliding scale insulin, regimen adjusted  5.  Permanent A. fib On anticoagulation with Eliquis.  Rate controlled.  Without any medication.  6.  Chronic HFpEF Patient was on 60 mg of oral Lasix. Echocardiogram negative for any acute abnormality per Echocardiogram with bubble study also negative for any intra-chamber shunt. Resume oral Lasix  7. Acute kidney injury/hyponatremia Likely due to septic shock/hypovolemia/ATN.  Improved.  Monitor sodium levels.  8. Normocytic anemia No evidence of overt bleeding. Currently stable.  9. Thrombocytopenia Platelet count noted to be slowly trending downwards.  Possibly due to sepsis.  Patient is on apixaban. Medications reviewed.  For now we will hold his hydroxychloroquine. Peripheral smear negative for any schistocytes or acute abnormality. Cefazolin can also cause thrombocytopenia.  Currently on vancomycin Currently improving significantly after changing the antibiotics.  10. Toxic metabolic  encephalopathy In the setting of sepsis, renal failure, hypoxemia ICU stay.  Improving.  11.  Poor  p.o. intake. Dietary recommended cortrak.  PCCM ordered. Unable to insert it without agitation and therefore patient currently refusing it. Monitor. Body mass index is 28.32 kg/m.   Malnutrition Type: Nutrition Problem: Inadequate oral intake Etiology: decreased appetite,acute illness Nutrition Interventions: Interventions: Tube feeding,Liberalize Diet   12.  Medial sacral DTI. POA Continue foam dressing  Pressure Injury 02/09/20 Sacrum Medial Stage 2 -  Partial thickness loss of dermis presenting as a shallow open injury with a red, pink wound bed without slough. (Active)  02/09/20 0900  Location: Sacrum  Location Orientation: Medial  Staging: Stage 2 -  Partial thickness loss of dermis presenting as a shallow open injury with a red, pink wound bed without slough.  Wound Description (Comments):   Present on Admission: Yes   13.  Constipation.  Resolved Initiate bowel regimen.  Monitor.  X-ray abdomen negative for any acute intra-abdominal pathology.  14.  Severe deconditioning. Unable to work with physical therapy due to severe deconditioning as well as pain. On top of no p.o. intake. Continue PT.  15.  Goals of care conversation. Prognosis very poor given patient's multiple comorbidities, rheumatoid arthritis, COVID-19 infection as well as MSSA endocarditis and septic arthritis being the main.  On top of that patient has poor p.o. intake throughout his 24-day hospitalization.  Currently significantly weak and unable to work physical therapy. Patient's only desire is to go home. I am not sure whether patient can go home anytime given the goal of treating treatable conditions. In the best case scenario patient will likely be able to discharge to SNF and may have frequent readmissions. Extensive discussion with family and patient on 2/1. Discussed patient's current trajectory  and future prognosis. Expect frequent admission if the goal is to treat what is treatable. Expected delayed discharge from the hospital given patient's severe deconditioning. Expect ongoing new comorbidities developing as well. Currently patient reports severe pain and wants to focus on pain control.  Will initiate scheduled OxyContin along with ongoing pain control. If the patient's mentation or medical condition worsens recommend to transition to complete comfort and residential hospice at that point.  Patient and daughter currently agreeable.  16.  Difficulty swallowing secondary to deconditioning. Speech therapy consulted. Recommended dysphagia 3 diet.  Diet: Dysphagia 3 diet DVT Prophylaxis:    apixaban (ELIQUIS) tablet 5 mg    Advance goals of care discussion: DNR/DNI  Family Communication: no family was present at bedside, at the time of interview.  Discussed on the phone with daughter on 1/31  Disposition:  Status is: Inpatient  Remains inpatient appropriate because: 0% p.o. intake and inability to work with physical therapy with goal of treating what is treatable conditions and severe hypoxia on exertion.  Dispo: The patient is from: Home              Anticipated d/c is to: to be determined               Anticipated d/c date is: 3 days              Patient currently is not medically stable to d/c.  Subjective: Fever early morning but no nausea or vomiting.  Reports severe back pain.  Reported diarrhea 3 episodes today.  Physical Exam:  General: Appear in moderate distress, no Rash; Oral Mucosa Clear, moist. no Abnormal Neck Mass Or lumps, Conjunctiva normal  Cardiovascular: S1 and S2 Present, no Murmur, Respiratory: increased respiratory effort, Bilateral Air entry present and bilateral  Crackles, no wheezes Abdomen:  Bowel Sound present, Soft and no tenderness Extremities: trace Pedal edema Neurology: alert and oriented to time, place, and person affect appropriate.  no new focal deficit Gait not checked due to patient safety concerns    Vitals:   02/20/20 0400 02/20/20 0725 02/20/20 0901 02/20/20 1733  BP: 124/60 119/66  (!) 109/56  Pulse: 89 100  85  Resp: (!) Temp: 97.6 F (36.4 C) 97.8 F (36.6 C) (!) 100.8 F (38.2 C) 97.6 F (36.4 C)  TempSrc: Oral Oral Axillary Axillary  SpO2: 91% 92%  95%  Weight:      Height:        Intake/Output Summary (Last 24 hours) at 02/20/2020 2005 Last data filed at 02/20/2020 1824 Gross per 24 hour  Intake -  Output 1300 ml  Net -1300 ml   Filed Weights   02/03/20 0410 02/09/20 0915 02/12/20 0412  Weight: 72.6 kg 77.5 kg 79.6 kg    Data Reviewed: I have personally reviewed and interpreted daily labs, tele strips, imaging. I reviewed all nursing notes, pharmacy notes, vitals, pertinent old records I have discussed plan of care as described above with RN and patient/family.  CBC: Recent Labs  Lab 02/15/20 0756 02/16/20 0204 02/17/20 0421 02/18/20 0345 02/20/20 0330  WBC 6.9 7.3 7.1 7.1 7.5  HGB 10.6* 11.3* 10.1* 11.2* 9.4*  HCT 33.1* 32.6* 29.8* 34.7* 29.1*  MCV 94.8 90.8 92.8 93.8 93.9  PLT 75* 80* 96* 127* 144*   Basic Metabolic Panel: Recent Labs  Lab 02/14/20 0307 02/15/20 0756 02/16/20 0204 02/17/20 0421 02/18/20 0345 02/19/20 0356 02/20/20 0330  NA 133* 133* 131* 131*  --   --  130*  K 3.6 4.3 4.1 3.7  --   --  3.2*  CL 105 104 102 102  --   --  97*  CO2 18* 20* 20* 21*  --   --  23  GLUCOSE 85 123* 84 86  --   --  98  BUN 7*  --   --  18  CREATININE 0.70 0.59* 0.53* 0.62  --   --  1.12  CALCIUM 7.6* 7.3* 7.5* 7.7*  --   --  7.6*  MG 2.0 2.0 1.9 1.7 1.8 1.7 1.8    Studies: CT CHEST WO CONTRAST  Result Date: 02/20/2020 CLINICAL DATA:  COVID pneumonia EXAM: CT CHEST WITHOUT CONTRAST TECHNIQUE: Multidetector CT imaging of the chest was performed following the standard protocol without IV contrast. COMPARISON:  None. FINDINGS: Cardiovascular: Right upper  extremity PICC, tip position near the superior cavoatrial junction. Aortic atherosclerosis. Mild cardiomegaly. Three-vessel coronary artery calcifications and/or stents. No pericardial effusion. Mediastinum/Nodes: Multiple benign calcified mediastinal and right hilar lymph nodes. Large hiatal hernia with intrathoracic position of the gastric body and fundus. Thyroid gland, trachea, and esophagus demonstrate no significant findings. Lungs/Pleura: Very extensive bilateral heterogeneous, ground-glass, and consolidative airspace disease throughout the lungs. Small bilateral pleural effusions and associated atelectasis or consolidation. Upper Abdomen: No acute abnormality. Musculoskeletal: No chest wall mass or suspicious bone lesions identified. IMPRESSION: 1. Very extensive bilateral heterogeneous, ground-glass, and consolidative airspace disease throughout the lungs. Small bilateral pleural effusions and associated atelectasis or consolidation. Findings are consistent with COVID-19 pneumonia. 2. Large hiatal hernia with intrathoracic position of the gastric body and fundus. 3. Coronary artery disease. Aortic Atherosclerosis (ICD10-I70.0). Electronically Signed   By: Lauralyn Primes M.D.   On: 02/20/2020 12:06    Scheduled Meds: . (feeding supplement) PROSource Plus  30 mL Oral TID WC  . apixaban  5 mg Oral BID  . vitamin C  500 mg Oral Daily  . calcium carbonate  1,250 mg Oral Q breakfast  . chlorhexidine  15 mL Mouth Rinse BID  . Chlorhexidine Gluconate Cloth  6 each Topical Daily  . feeding supplement  1 Container Oral QID  . folic acid  1 mg Oral Daily  . gabapentin  100 mg Oral QHS  . insulin aspart  0-9 Units Subcutaneous TID WC  . lidocaine  1 patch Transdermal Q24H  . mouth rinse  15 mL Mouth Rinse BID  . oxyCODONE  10 mg Oral Q12H  . pantoprazole  40 mg Oral Daily  . pravastatin  80 mg Oral q1800  . predniSONE  10 mg Oral Q breakfast   Followed by  . [START ON 02/23/2020] predniSONE  5 mg Oral Q  breakfast  . vitamin B-12  1,000 mcg Oral Daily  . zinc sulfate  220 mg Oral Daily   Continuous Infusions: . sodium chloride Stopped (02/18/20 0622)  . [START ON 02/21/2020] vancomycin     PRN Meds: acetaminophen **OR** acetaminophen, lip balm, methocarbamol, morphine injection, oxyCODONE, phenol, senna-docusate, sodium chloride flush  Time spent: 35 minutes  Author: Lynden Oxford, MD [facility] 02/20/2020 8:05 PM  To reach On-call, see care teams to locate the attending and reach out via [email or web address]. Between 7PM-7AM, please contact night-coverage If you still have difficulty reaching the attending provider, please page the [facility] (Director on Call) for [facility] on amion for assistance.

## 2020-02-20 NOTE — Discharge Instructions (Addendum)
Hospice Hospice is a service that provides people who are terminally ill and their families with medical, spiritual, and psychological support. It aims to improve a person's quality of life by keeping the person as comfortable as possible in the final stages of life. Who makes up the hospice care team? The following people make up a hospice care team:  The person receiving care and his or her family.  A nurse and a hospice doctor. Your primary health care provider can also be included.  A social worker or case manager.  A religious or spiritual leader.  Specialists such as: ? A dietitian. ? A mental health counselor. ? Physical and occupational therapists. ? Bereavement coordinator.  Trained volunteers who can help with care.   What services are included in hospice care? Hospice services can vary, depending on the organization. Generally, they include:  Ways to keep you comfortable, such as: ? Providing care in your home or in a home-like setting. ? Providing relief from pain and symptoms. The staff will supply all medicines and equipment to keep you comfortable and alert enough to enjoy the company of friends and family. ? Working with your loved ones to help them meet your needs and provide much of your basic care. This helps you to enjoy their support.  Visits or care from a nurse and doctor. This may include 24-hour on-call services.  Visits from other specialists who offer services, such as: ? Counseling to meet your emotional, spiritual, and social needs as well as those needs of your family. ? Massage therapy. ? Nutrition therapy. ? Physical and occupational therapy. ? Art or music therapy. ? Spiritual care to meet your needs and your family's needs. It may involve:  Helping you and your family understand the dying process.  Helping you say goodbye to your family and friends.  Performing a specific religious ceremony or ritual.  Companionship when you are  alone.  Allowing you and your family to rest. Hospice staff may do light housekeeping, prepare meals, and run errands.  Short-term inpatient care, if something cannot be managed in the home.  Bereavement support for grieving family members. When should hospice care begin? Most people who use hospice are believed to have 6 months or less to live.  Your family and health care providers can help you decide when hospice services should begin.  If you live longer than 6 months but your condition does not improve, your doctor may be able to approve you for continued hospice care.  If your condition improves, you may discontinue the program. What should I consider before selecting a program? Most hospice programs are run by nonprofit, independent organizations. Some are affiliated with hospitals, nursing homes, or home health care agencies. Hospice programs can take place in your home or at a hospice center, hospital, or skilled nursing facility. When choosing a hospice program, ask about the following:  What services are available to me and my loved ones? ? What does it cost? Is it covered by insurance? ? Is the program reviewed and licensed by the state or certified in some other way? ? How will my pain and symptoms be managed? ? Will you provide emotional and spiritual support?  If I choose a hospice center or nursing home, where is the hospice center located? Is it convenient for family and friends? ? If I choose a hospice center or nursing home, will my family and friends be able to visit any time? ? If my circumstances change, can   the services be provided in a different setting, such as in my home or in the hospital?  Who makes up the hospice care team? How are they trained or screened? ? How involved can my loved ones be? ? Whom can my family call with questions? ? How is my health care provider involved? Where can I learn more about hospice? You can learn about existing hospice  programs in your area from your health care providers. The websites of the following organizations have helpful information:  National Hospice and Palliative Care Organization: www.nhpco.org  National Association for Home Care & Hospice: www.nahc.org  Hospice Foundation of [place]: www.hospicefoundation.org  American Cancer Society: www.cancer.org  eHospice: ehospice.com These agencies also can provide information:  A local agency on aging.  Your local United Way chapter.  Your state's department of health or social services. Summary  Hospice is a service that provides people who are terminally ill and their families with medical, spiritual, and psychological support.  Hospice aims to improve your quality of life by keeping you as comfortable as possible in the final stages of life.  Hospice teams often include a doctor, nurse, social worker, counselor, religious or spiritual leader, dietitian, therapists, and volunteers.  Hospice care generally includes pain management, visits from doctors and nurses, physical and occupational therapy, nutrition therapy, spiritual and emotional counseling, caregiver support, and bereavement support for grieving family members.  Hospice programs can take place in your home or at a hospice center, hospital, or skilled nursing facility. This information is not intended to replace advice given to you by your health care provider. Make sure you discuss any questions you have with your health care provider. Document Revised: 10/10/2019 Document Reviewed: 10/10/2019 Elsevier Patient Education  2021 Elsevier Inc.  

## 2020-02-20 NOTE — Progress Notes (Signed)
PHARMACY CONSULT NOTE FOR:  OUTPATIENT  PARENTERAL ANTIBIOTIC THERAPY (OPAT)  Indication: MSSA septic arthritis and bacteremia Regimen: Vancomycin 1g q24hr End date: 03/21/20  IV antibiotic discharge orders are pended. To discharging provider:  please sign these orders via discharge navigator,  Select New Orders & click on the button choice - Manage This Unsigned Work.     Thank you for allowing pharmacy to be a part of this patient's care.  Ulyses Southward, PharmD, BCIDP, AAHIVP, CPP Infectious Disease Pharmacist 02/20/2020 10:21 AM

## 2020-02-20 NOTE — Progress Notes (Signed)
Physical Therapy Treatment Patient Details Name: Javier Beard MRN: [id number] DOB: [birth year] Today's Date: 02/20/2020    History of Present Illness Pt is a 80 y.o. male admitted 01-28-20 with worsening SOB, nausea/vomitting. Workup for acute hypoxic respiratory failure due to COVID-19 PNA. CTA 1/14 negative for PE. Pt with worsening respiratory status and AMS early AM 1/21; head CT negative for acute abnormality. PMH includes RA, DM2, afib, HF.    PT Comments    Pt premedicated with pain medication and muscle relaxants prior to session, increasing pt's ability to tolerate mobility. Pt continues to need 6L O2 via [place], however oxygen demand able to be controlled with cuing for breathing and does not require increased in O2 level today. Pt is maxAx2 for turning for pericare due to fecal incontinence, modAx2 for coming to sit EoB and maxAx2 for coming to standing. Pt only able to maintain standing for ~10 sec due to increasing back pain. Pt requires total A to return to bed. At this time pt is limited in safe mobility by increased pain and O2 demand and would not be able to tolerate 3 hours of therapy at CIR. PT recommending SNF level rehab at discharge. PT will continue to work with pt acutely with hopes that with pain management pt will be able to tolerate more therapy.     Follow Up Recommendations  SNF     Equipment Recommendations  Rolling walker with 5" wheels       Precautions / Restrictions Precautions Precautions: Fall;Other (comment) Precaution Comments: watch SpO2 Restrictions Weight Bearing Restrictions: No    Mobility  Bed Mobility Overal bed mobility: Needs Assistance Bed Mobility: Supine to Sit;Sit to Supine;Rolling Rolling: Max assist   Supine to sit: Mod assist;+2 for physical assistance Sit to supine: Total assist   General bed mobility comments: maxAx2 for rolling for pericare, modAx2 with cuing for sequencing to bring pt to sitting EoB, limited in ability to return to  bed by increased back pain  Transfers       Sit to Stand: Total assist;+2 physical assistance         General transfer comment: maximal multimodal cuing to achieve upright, unable to maintain due to increased back pain  Ambulation/Gait             General Gait Details: unable          Balance Overall balance assessment: Needs assistance Sitting-balance support: Feet supported;Bilateral upper extremity supported Sitting balance-Leahy Scale: Poor Sitting balance - Comments: requires outside assist to maintain sitting balance                                    Cognition Arousal/Alertness: Awake/alert Behavior During Therapy: Flat affect Overall Cognitive Status: Impaired/Different from baseline Area of Impairment: Memory                   Current Attention Level: Selective Memory: Decreased short-term memory Following Commands: Follows one step commands with increased time   Awareness: Anticipatory Problem Solving: Slow processing;Difficulty sequencing;Requires verbal cues;Requires tactile cues;Decreased initiation General Comments: pt does not remember therapy session yesterday or onset of double vision, repeatedly states "I don't know when asked about comfort with positional changes      Exercises General Exercises - Lower Extremity Long Arc Quad: AROM;Both;10 reps;Seated    General Comments General comments (skin integrity, edema, etc.): Pt on 6L O2 via [place] on entry with  SaO2 in low 90%s, with mobilization pt pain increases increasing RR and HR, SaO2 drops to 84%O2, HR in 110s RR 40s, requires increased cuing for deep breathing into his belly.at end of session pt placed back in nearly flattened bed with SaO2 >90% and RR 28      Pertinent Vitals/Pain Pain Assessment: Faces Faces Pain Scale: Hurts even more Pain Location: back Pain Descriptors / Indicators: Grimacing;Guarding Pain Intervention(s): Monitored during session;Limited  activity within patient's tolerance;Premedicated before session;Repositioned           PT Goals (current goals can now be found in the care plan section) Acute Rehab PT Goals Patient Stated Goal: get out of bed PT Goal Formulation: With patient Time For Goal Achievement: 02/26/20 Potential to Achieve Goals: Fair Progress towards PT goals: Progressing toward goals    Frequency    Min 2X/week      PT Plan Frequency needs to be updated;Discharge plan needs to be updated       AM-PAC PT "6 Clicks" Mobility   Outcome Measure  Help needed turning from your back to your side while in a flat bed without using bedrails?: A Lot Help needed moving from lying on your back to sitting on the side of a flat bed without using bedrails?: A Lot Help needed moving to and from a bed to a chair (including a wheelchair)?: Total Help needed standing up from a chair using your arms (e.g., wheelchair or bedside chair)?: Total Help needed to walk in hospital room?: Total Help needed climbing 3-5 steps with a railing? : Total 6 Click Score: 8    End of Session Equipment Utilized During Treatment: Oxygen Activity Tolerance: Patient limited by fatigue;Patient limited by pain (onset of back pain with sitting EoB) Patient left: with call bell/phone within reach;in bed;with bed alarm set Nurse Communication: Mobility status PT Visit Diagnosis: Unsteadiness on feet (R26.81);Other abnormalities of gait and mobility (R26.89)     Time: 8546-2703 PT Time Calculation (min) (ACUTE ONLY): 41 min  Charges:  $Therapeutic Exercise: 8-22 mins                     Keyari Kleeman B. Beverely Risen PT, DPT Acute Rehabilitation Services Pager [phone] Office [phone]    [name] Fleet 02/20/2020, 1:51 PM

## 2020-02-20 NOTE — Progress Notes (Signed)
Pharmacy Antibiotic Note  Javier Beard is a 80 y.o. male admitted on 02/17/2020 with MSSA bacteremia.  Pharmacy has been consulted for vancomycin dosing. Patient was previously on cefazolin for his MSSA bacteremia, however he has developed thrombocytopenia without an obvious cause. Cefazolin has the potential to cause this in patients so the ID team is recommending changing to vancomycin to see if it will help his platelet recovery  Vancomycin levels obtained at [facility]. Peak 36, trough 18 (drawn 3 hours early TT 13). Current AUC at 1000 mg q12h is 563, within the goal range of 400-600. Continue current dose, scr remains stable. OPAT orders have been placed   Pt remains stable on abx. Stop date added to vanc (3/3). Scr increased to 1.12 today. We will change his vanc dose for now.   Plan: Change Vancomycin 1000mg  IV q24 through 03/21/2020 Levels as indicated while inpatient  Bmet in AM then q48  Height: 5\' 6"  (167.6 cm) Weight: 79.6 kg (175 lb 7.8 oz) IBW/kg (Calculated) : 63.8  Temp (24hrs), Avg:98.3 F (36.8 C), Min:97.5 F (36.4 C), Max:100.8 F (38.2 C)  Recent Labs  Lab 02/14/20 0307 02/15/20 0756 02/15/20 1908 02/16/20 0204 02/16/20 1107 02/17/20 0421 02/18/20 0345 02/20/20 0330  WBC 8.8 6.9  --  7.3  --  7.1 7.1 7.5  CREATININE 0.70 0.59*  --  0.53*  --  0.62  --  1.12  LATICACIDVEN  --   --   --   --  1.9  --   --   --   VANCOTROUGH  --   --   --  18  --   --   --   --   VANCOPEAK  --   --  36  --   --   --   --   --     Estimated Creatinine Clearance: 54.8 mL/min (by C-G formula based on SCr of 1.12 mg/dL).    Allergies  Allergen Reactions  . Cefazolin Other (See Comments)    thrombotopenia    Antimicrobials this admission: 1/21 linezolid x1 1/21 cefepime x1 1/22 cefazolin >>1/25 1/25 vancomycin >>3/3   Microbiology results: 1/21 BCx: MSSA 1/22 BCx: staph captis 1/25 BCx ngtdF  2/22, PharmD, BCIDP, AAHIVP, CPP Infectious Disease Pharmacist 02/20/2020  10:15 AM

## 2020-02-20 NOTE — Progress Notes (Signed)
Occupational Therapy Treatment Patient Details Name: Javier Beard MRN: [id number] DOB: November 09, 1940 Today's Date: 02/20/2020    History of present illness Pt is a 80 y.o. male admitted 02/12/2020 with worsening SOB, nausea/vomitting. Workup for acute hypoxic respiratory failure due to COVID-19 PNA. CTA 1/14 negative for PE. Pt with worsening respiratory status and AMS early AM 1/21; head CT negative for acute abnormality. PMH includes RA, DM2, afib, HF.   OT comments  Pt making slow progress with therapies. Today with limitations largely due to back pain with certain positions and sitting at EOB. Pt initially requiring totalA for pericare after bowel incontinence at bed level, tolerating sitting EOB for brief period and sit<>stand trial. He requires heavy two person assist for all aspect of mobility at this time. Pt also with increased WOB (RR up to the 40s) with activity requiring cues for slowed/deep breathing and rest breaks with all activity. Daughter present and supportive throughout. Continue to recommend post acute therapies at time of discharge. Acute OT to follow.    Follow Up Recommendations  SNF;Supervision/Assistance - 24 hour    Equipment Recommendations  None recommended by OT          Precautions / Restrictions Precautions Precautions: Fall;Other (comment) Precaution Comments: watch SpO2 Restrictions Weight Bearing Restrictions: No       Mobility Bed Mobility Overal bed mobility: Needs Assistance Bed Mobility: Supine to Sit;Sit to Supine;Rolling Rolling: Max assist   Supine to sit: Mod assist;+2 for physical assistance Sit to supine: Total assist   General bed mobility comments: maxAx2 for rolling for pericare, modAx2 with cuing for sequencing to bring pt to sitting EoB, limited in ability to return to bed by increased back pain  Transfers Overall transfer level: Needs assistance Equipment used: 2 person hand held assist   Sit to Stand: Total assist;+2 physical  assistance         General transfer comment: maximal multimodal cuing to achieve upright, unable to maintain due to increased back pain    Balance Overall balance assessment: Needs assistance Sitting-balance support: Feet supported;Bilateral upper extremity supported Sitting balance-Leahy Scale: Poor Sitting balance - Comments: requires outside assist to maintain sitting balance                                   ADL either performed or assessed with clinical judgement   ADL Overall ADL's : Needs assistance/impaired                             Toileting- Clothing Manipulation and Hygiene: Total assistance;+2 for physical assistance;+2 for safety/equipment;Bed level Toileting - Clothing Manipulation Details (indicate cue type and reason): for pericare after episode of incontinence     Functional mobility during ADLs: +2 for physical assistance;+2 for safety/equipment;Total assistance (sit<>Stand)                         Cognition Arousal/Alertness: Awake/alert Behavior During Therapy: Flat affect Overall Cognitive Status: Impaired/Different from baseline Area of Impairment: Memory                   Current Attention Level: Selective Memory: Decreased short-term memory Following Commands: Follows one step commands with increased time   Awareness: Anticipatory Problem Solving: Slow processing;Difficulty sequencing;Requires verbal cues;Requires tactile cues;Decreased initiation General Comments: pt does not remember therapy session yesterday or onset of double  vision, repeatedly states "I don't know when asked about comfort with positional changes        Exercises General Exercises - Lower Extremity Long Arc Quad: AROM;Both;10 reps;Seated   Shoulder Instructions       General Comments Pt on 6L O2 via [place] on entry with SaO2 in low 90%s, with mobilization pt pain increases increasing RR and HR, SaO2 drops to 84%O2, HR in 110s RR  40s, requires increased cuing for deep breathing into his belly.at end of session pt placed back in nearly flattened bed with SaO2 >90% and RR 28    Pertinent Vitals/ Pain       Pain Assessment: Faces Faces Pain Scale: Hurts even more Pain Location: back Pain Descriptors / Indicators: Grimacing;Guarding Pain Intervention(s): Limited activity within patient's tolerance;Monitored during session;Premedicated before session;Repositioned  Home Living                                          Prior Functioning/Environment              Frequency  Min 2X/week        Progress Toward Goals  OT Goals(current goals can now be found in the care plan section)  Progress towards OT goals: Progressing toward goals  Acute Rehab OT Goals Patient Stated Goal: get out of bed OT Goal Formulation: With patient Time For Goal Achievement: 03/05/20 Potential to Achieve Goals: Good ADL Goals Pt Will Perform Grooming: with min assist;standing Pt Will Perform Upper Body Bathing: with min assist;sitting Pt Will Perform Lower Body Bathing: with mod assist;sit to/from stand Pt Will Transfer to Toilet: with min assist;bedside commode;stand pivot transfer Pt Will Perform Toileting - Clothing Manipulation and hygiene: with min assist;sitting/lateral leans Pt/caregiver will Perform Home Exercise Program: Increased strength;Both right and left upper extremity;With theraband;With Supervision;With written HEP provided Additional ADL Goal #1: Pt will actively participate in 20 mins therapeutic activity with no more than 2 rest breaks and Sp02 >88%  Plan Discharge plan remains appropriate    Co-evaluation    PT/OT/SLP Co-Evaluation/Treatment: Yes Reason for Co-Treatment: Complexity of the patient's impairments (multi-system involvement);For patient/therapist safety;To address functional/ADL transfers   OT goals addressed during session: ADL's and self-care      AM-PAC OT "6 Clicks"  Daily Activity     Outcome Measure   Help from another person eating meals?: A Little Help from another person taking care of personal grooming?: A Little Help from another person toileting, which includes using toliet, bedpan, or urinal?: Total Help from another person bathing (including washing, rinsing, drying)?: A Lot Help from another person to put on and taking off regular upper body clothing?: A Lot Help from another person to put on and taking off regular lower body clothing?: Total 6 Click Score: 12    End of Session Equipment Utilized During Treatment: Oxygen  OT Visit Diagnosis: Unsteadiness on feet (R26.81);Other abnormalities of gait and mobility (R26.89);Muscle weakness (generalized) (M62.81);Other symptoms and signs involving cognitive function;Pain Pain - part of body:  (back)   Activity Tolerance Patient tolerated treatment well;Patient limited by pain   Patient Left in bed;with call bell/phone within reach;with bed alarm set;with family/visitor present   Nurse Communication Mobility status        Time: 2353-6144 OT Time Calculation (min): 41 min  Charges: OT General Charges $OT Visit: 1 Visit OT Treatments $Self Care/Home Management : 23-37 mins  Marcy Siren, OT Acute Rehabilitation Services Pager [phone] Office [phone]   Orlando Penner 02/20/2020, 5:05 PM

## 2020-02-20 NOTE — Progress Notes (Signed)
  Speech Language Pathology Treatment: Dysphagia  Patient Details Name: Javier Beard MRN: [id number] DOB: December 02, 1940 Today's Date: 02/20/2020 Time: 1102-1117 SLP Time Calculation (min) (ACUTE ONLY): 15 min  Assessment / Plan / Recommendation Clinical Impression  F/u after initial clinical swallow assessment 1/30.  Pt demonstrated functional swallow and diet toleration during session today.  Sp02 remained in low 90s for duration.  With assist, pt self-fed items from lunch tray with adequate mastication, the appearance of a brisk swallow, no s/s of aspiration, no s/s of dyssynchrony between breathing/swallowing cycles. He consumed only a few bites/sips.  When offered choice of remaining on mechanical soft vs upgrading to regular solids, he expressed a desire to remain on current diet.  There are no further acute swallowing needs; SLP services will sign off.      HPI HPI: Pt is a 80 y.o. male admitted 02/01/2020 with worsening SOB, nausea/vomitting. Workup for acute hypoxic respiratory failure due to COVID-19 PNA. CTA 1/14 negative for PE. Pt with worsening respiratory status and AMS early AM 1/21; head CT negative for acute abnormality. PMH includes RA, DM2, afib, HF.  RN with concern regarding potential aspiration event on 1/29 and SLP was ordered. CT chest 2/1 c/w COVID pna.      SLP Plan  All goals met       Recommendations  Diet recommendations: Dysphagia 3 (mechanical soft);Thin liquid Liquids provided via: Straw Medication Administration: Whole meds with puree Supervision: Staff to assist with self feeding Postural Changes and/or Swallow Maneuvers: Seated upright 90 degrees                Oral Care Recommendations: Oral care BID Follow up Recommendations: None SLP Visit Diagnosis: Dysphagia, unspecified (R13.10) Plan: All goals met       GO                Javier Beard 02/20/2020, 2:16 PM  Javier Beard L. Tivis Ringer, [place] Office  number [phone] Pager [phone]

## 2020-02-20 DEATH — deceased

## 2020-02-21 DIAGNOSIS — R7881 Bacteremia: Secondary | ICD-10-CM | POA: Diagnosis not present

## 2020-02-21 DIAGNOSIS — J189 Pneumonia, unspecified organism: Secondary | ICD-10-CM | POA: Diagnosis not present

## 2020-02-21 DIAGNOSIS — E871 Hypo-osmolality and hyponatremia: Secondary | ICD-10-CM | POA: Diagnosis not present

## 2020-02-21 DIAGNOSIS — U071 COVID-19: Secondary | ICD-10-CM | POA: Diagnosis not present

## 2020-02-21 LAB — BLOOD GAS, ARTERIAL
Acid-base deficit: 1.4 mmol/L (ref 0.0–2.0)
Bicarbonate: 22.3 mmol/L (ref 20.0–28.0)
FIO2: 100
O2 Saturation: 90.5 %
Patient temperature: 37.5
pCO2 arterial: 35 mmHg (ref 32.0–48.0)
pH, Arterial: 7.422 (ref 7.350–7.450)
pO2, Arterial: 62.3 mmHg — ABNORMAL LOW (ref 83.0–108.0)

## 2020-02-21 LAB — CBC
HCT: 32.3 % — ABNORMAL LOW (ref 39.0–52.0)
Hemoglobin: 10.2 g/dL — ABNORMAL LOW (ref 13.0–17.0)
MCH: 30.1 pg (ref 26.0–34.0)
MCHC: 31.6 g/dL (ref 30.0–36.0)
MCV: 95.3 fL (ref 80.0–100.0)
Platelets: 186 10*3/uL (ref 150–400)
RBC: 3.39 MIL/uL — ABNORMAL LOW (ref 4.22–5.81)
RDW: 15.4 % (ref 11.5–15.5)
WBC: 9.8 10*3/uL (ref 4.0–10.5)
nRBC: 0 % (ref 0.0–0.2)

## 2020-02-21 LAB — BASIC METABOLIC PANEL
Anion gap: 10 (ref 5–15)
BUN: 21 mg/dL (ref 8–23)
CO2: 23 mmol/L (ref 22–32)
Calcium: 8.1 mg/dL — ABNORMAL LOW (ref 8.9–10.3)
Chloride: 99 mmol/L (ref 98–111)
Creatinine, Ser: 1.06 mg/dL (ref 0.61–1.24)
GFR, Estimated: >60 mL/min (ref >60)
Glucose, Bld: 108 mg/dL — ABNORMAL HIGH (ref 70–99)
Potassium: 4.3 mmol/L (ref 3.5–5.1)
Sodium: 132 mmol/L — ABNORMAL LOW (ref 135–145)

## 2020-02-21 LAB — MAGNESIUM: Magnesium: 1.9 mg/dL (ref 1.7–2.4)

## 2020-02-21 LAB — GLUCOSE, CAPILLARY
Glucose-Capillary: 130 mg/dL — ABNORMAL HIGH (ref 70–99)
Glucose-Capillary: 91 mg/dL (ref 70–99)
Glucose-Capillary: 105 mg/dL — ABNORMAL HIGH (ref 70–99)

## 2020-02-21 LAB — C-REACTIVE PROTEIN: CRP: 24.4 mg/dL — ABNORMAL HIGH (ref <1.0)

## 2020-02-21 MED ORDER — SODIUM CHLORIDE 0.9 % IV BOLUS
1000.0000 mL | Freq: Once | INTRAVENOUS | Status: AC
  Administered 2020-02-21: 1000 mL via INTRAVENOUS

## 2020-02-21 MED ORDER — MORPHINE SULFATE (PF) 2 MG/ML IV SOLN
2.0000 mg | INTRAVENOUS | Status: DC | PRN
Start: 2020-02-21 — End: 2020-02-23
  Administered 2020-02-21 – 2020-02-22 (×7): 2 mg via INTRAVENOUS
  Filled 2020-02-21 (×7): qty 1

## 2020-02-21 MED ORDER — SODIUM CHLORIDE 0.9 % IV SOLN: INTRAVENOUS | Status: DC

## 2020-02-21 MED ORDER — METOPROLOL TARTRATE 5 MG/5ML IV SOLN
5.0000 mg | Freq: Four times a day (QID) | INTRAVENOUS | Status: DC | PRN
  Administered 2020-02-22: 5 mg via INTRAVENOUS
  Filled 2020-02-21: qty 5

## 2020-02-21 NOTE — Progress Notes (Signed)
PROGRESS NOTE    Javier Beard  NWG:[id number] DOB: [birth year] DOA: 02-11-2020 PCP: Lenox Ponds, MD   Brief Narrative: Javier Beard is a 80 y.o. male with a history of rheumatoid arthritis, type II DM, A. fib, chronic HFpEF. Patient presented secondary to cough and shortness of breath secondary to COVID-19 and associated pneumonia. Presentation complicated by hypoxia. While admitted, patient developed signs of sepsis secondary to bacteremia with associated spinal infection without evidence of endocarditis. Patient developed septic shock requiring vasopressor support in the ICU but was able to wean off successfully. Patient started to improve clinically but then developed worsening hypoxia, decreased oral intake, worsening mentation in setting of worsened evidence of pneumonia.    Assessment & Plan:   Principal Problem:   Acute hypoxemic respiratory failure due to COVID-19 [facility]) Active Problems:   COVID-19 virus infection   Type 2 diabetes mellitus with vascular disease (HCC)   Rheumatoid arthritis (HCC)   PAF (paroxysmal atrial fibrillation) (HCC)   CAD (coronary artery disease)   Pneumonia due to COVID-19 virus   Hyponatremia   Multifocal pneumonia   Acute respiratory distress   Acute respiratory failure with hypoxia (HCC)   MSSA bacteremia   Hypoxia   Thrombocytopenia (HCC)   Pressure injury of skin   Acute respiratory failure with hypoxia Secondary to COVID-19 pneumonia. Patient still requiring significant amounts of oxygen and is currently on 15 L of HFNC in addition to NRB. -Continue oxygen -Keep O2 >92%  COVID-19 pneumonia Patient was treated with Remdesivir and prednisone. Also received antibiotics but for below problems. Patient no longer requiring isolation.  Septic shock Not present on admission and secondary to bacteremia/pneumonia/septic arthritis. Patient required Levophed for support which has been weaned off. Hypotension today which has responded to  IV fluids.  MSSA bacteremia MSSA pneumonia L4-L5 septic arthritis Epidural phlegmon Soft tissue abscess Patient with positive blood cultures for MSSA and was started on Cefazolin IV which was changed to Vancomycin IV. ID consulted. MRI imaging found evidence of associated spinal infection. Recommendation for 6 weeks of IV antibiotics per infectious disease.  Poor oral intake Attempt to place Cortrak feeding tube failed with patient declining further attempts at feeding tube placement.   Rheumatoid arthritis Patient is on methotrexate, Simponi, Plaquenil as an outpatient which we held. He is also on prednisone. -Continue prednisone  Diabetes mellitus, type 2 Uncontrolled with hypo-/hyperglycemia.  Atrial fibrillation with RVR In setting of soft BP. Improved with IV fluids -Continue metoprolol IV prn  AKI Baseline creatinine of 1. Peak of 1.88 and creatinine is back to baseline. AKI resolved.  Normocytic anemia Hemoglobin stable.  Chronic diastolic heart failure Stable. -Continue Lasix  Thrombocytopenia Possibly secondary to sepsis/infection vs medication effect. Cefazolin was transitioned to Vancomycin.  Acute metabolic encephalopathy In setting of sepsis/renal failure/hypoxemia. Currently stable.  Pressure injury Medial sacrum. POA.  Constipation Resolved with bowel regimen.  Dysphagia SLP recommending dysphagia 3 diet.  Goals of care Long discussion with daughter on telephone. From her discussions with her dad, she believes that he is telling us how much he wants to do and how much he does not. At this time, he has made it clear he has decreased interest in oral intake, no desire for a feeding tube, more focus on pain management and the desire to be at home. From our discussion, we agreed that Javier Beard seems to be choosing a more comfort measures approach to his medical care. TOC consulted for home with hospice arrangements. Will  continue current level of care, but  will not escalate care. Will provide IV fluids overnight secondary to hypotension this morning. Increase to morphine 2 mg q2 hours prn and avoid oral medications for now if unable to tolerate.   DVT prophylaxis: Eliquis Code Status:   Code Status: DNR Family Communication: Daughter on telephone Disposition Plan: Discharge home with hospice likely in 24 hours pending delivery of equipment and set up of home hospices services.   Consultants:   PCCM  Procedures:   TRANSTHORACIC ECHOCARDIOGRAM (01/31/2020) IMPRESSIONS    1. Left ventricular ejection fraction, by estimation, is 60 to 65%. The  left ventricle has normal function. The left ventricle has no regional  wall motion abnormalities. Left ventricular diastolic parameters are  indeterminate.  2. Right ventricular systolic function is normal. The right ventricular  size is normal. There is mildly elevated pulmonary artery systolic  pressure.  3. The pericardial effusion is posterior to the left ventricle.  4. The mitral valve is abnormal. Mild mitral valve regurgitation. No  evidence of mitral stenosis.  5. Tricuspid valve regurgitation is moderate.  6. The aortic valve is normal in structure. Aortic valve regurgitation is  trivial. Mild to moderate aortic valve sclerosis/calcification is present,  without any evidence of aortic stenosis.  7. The inferior vena cava is normal in size with greater than 50%  respiratory variability, suggesting right atrial pressure of 3 mmHg.   TRANSTHORACIC ECHOCARDIOGRAM (02/02/2020) IMPRESSIONS    1. Left ventricular ejection fraction, by estimation, is 60 to 65%. The  left ventricle has normal function.  2. RV McConnell's Sign noted: this can be seen both in the setting of  acute lung pathology and in pulmonary embolism. Right ventricular systolic  function is low normal.  3. Agitated saline contrast bubble study was negative, with no evidence  of any interatrial shunt.    Comparison(s): A prior study was performed on 01/31/20. Compared to prior,  new presence of RV McConnell's Sign.    TRANSTHORACIC ECHOCARDIOGRAM (02/11/2020) IMPRESSIONS    1. Left ventricular ejection fraction, by estimation, is 60 to 65%. The  left ventricle has normal function.  2. Right ventricular systolic function is normal. The right ventricular  size is mildly enlarged.  3. The mitral valve is normal in structure. Trivial mitral valve  regurgitation.  4. Tricuspid valve regurgitation is moderate.  5. The aortic valve is tricuspid. There is mild calcification of the  aortic valve. Aortic valve regurgitation is trivial.   Comparison(s): A prior study was performed on 01/31/2020. Prior images  reviewed side by side. Compared to both recent studies; Improvement in RV  function from 02/02/20. Similar tricuspid regurgitation from 01/31/20.  Vegetation not visualized in this study.   Antimicrobials:  Remdesivir  Vancomycin  Cefepime  Cefazolin  Linezolid    Subjective: No concerns per patient.   Objective: Vitals:   02/21/20 0408 02/21/20 0601 02/21/20 0641 02/21/20 0800  BP: 117/65 103/68 (!) 97/52 91/60  Pulse: (!) 103 (!) 115 (!) 103 (!) 117  Resp: (!) 27 (!) 25 (!) 29 (!) 22  Temp: 97.8 F (36.6 C) 97.9 F (36.6 C) 97.9 F (36.6 C) 99.6 F (37.6 C)  TempSrc: Axillary Axillary Axillary Axillary  SpO2: 90% 99% 97% 95%  Weight:      Height:        Intake/Output Summary (Last 24 hours) at 02/21/2020 0910 Last data filed at 02/21/2020 0600 Gross per 24 hour  Intake -  Output 1150 ml  Net -1150  ml   Filed Weights   02/03/20 0410 02/09/20 0915 02/12/20 0412  Weight: 72.6 kg 77.5 kg 79.6 kg    Examination:  General exam: Appears calm and comfortable Respiratory system: Diminished with mild rales. Tachypnea Cardiovascular system: S1 & S2 heard. Tachycardia. Irregular rhythm. No murmurs, rubs, gallops or clicks. Gastrointestinal system: Abdomen is  nondistended, soft and nontender. No organomegaly or masses felt. Normal bowel sounds heard. Central nervous system: Alert and oriented to person and place. No focal neurological deficits. Musculoskeletal: No edema. No calf tenderness Skin: No cyanosis. No rashes Psychiatry: Judgement and insight appear normal. Mood & affect appropriate.     Data Reviewed: I have personally reviewed following labs and imaging studies  CBC Lab Results  Component Value Date   WBC 9.8 02/21/2020   RBC 3.39 (L) 02/21/2020   HGB 10.2 (L) 02/21/2020   HCT 32.3 (L) 02/21/2020   MCV 95.3 02/21/2020   MCH 30.1 02/21/2020   PLT 186 02/21/2020   MCHC 31.6 02/21/2020   RDW 15.4 02/21/2020   LYMPHSABS 0.5 (L) 02/07/2020   MONOABS 0.7 02/07/2020   EOSABS 0.0 02/07/2020   BASOSABS 0.0 02/07/2020     Last metabolic panel Lab Results  Component Value Date   NA 132 (L) 02/21/2020   K 4.3 02/21/2020   CL 99 02/21/2020   CO2 23 02/21/2020   BUN 21 02/21/2020   CREATININE 1.06 02/21/2020   GLUCOSE 108 (H) 02/21/2020   GFRNONAA >60 02/21/2020   CALCIUM 8.1 (L) 02/21/2020   PHOS 4.0 02/10/2020   PROT 6.0 (L) 02/16/2020   ALBUMIN 1.7 (L) 02/16/2020   BILITOT 1.5 (H) 02/16/2020   ALKPHOS 52 02/16/2020   AST 29 02/16/2020   ALT 17 02/16/2020   ANIONGAP 10 02/21/2020    CBG (last 3)  Recent Labs    02/20/20 1735 02/20/20 2059 02/21/20 0736  GLUCAP 175* 155* 130*     GFR: Estimated Creatinine Clearance: 57.9 mL/min (by C-G formula based on SCr of 1.06 mg/dL).  Coagulation Profile: No results for input(s): INR, PROTIME in the last 168 hours.  Recent Results (from the past 240 hour(s))  Culture, blood (Routine X 2) w Reflex to ID Panel     Status: None   Collection Time: 02/13/20  9:23 AM   Specimen: BLOOD  Result Value Ref Range Status   Specimen Description BLOOD RIGHT ANTECUBITAL  Final   Special Requests   Final    BOTTLES DRAWN AEROBIC ONLY Blood Culture results may not be optimal due  to an inadequate volume of blood received in culture bottles   Culture   Final    NO GROWTH 5 DAYS Performed at [facility], [address] [place], [place] [address]    Report Status 02/18/2020 FINAL  Final  Culture, blood (Routine X 2) w Reflex to ID Panel     Status: None   Collection Time: 02/13/20  1:24 PM   Specimen: BLOOD RIGHT HAND  Result Value Ref Range Status   Specimen Description BLOOD RIGHT HAND  Final   Special Requests   Final    BOTTLES DRAWN AEROBIC AND ANAEROBIC Blood Culture adequate volume   Culture   Final    NO GROWTH 5 DAYS Performed at [facility], [address] [place], [place] [address]    Report Status 02/18/2020 FINAL  Final  Culture, blood (routine x 2)     Status: None (Preliminary result)   Collection Time: 02/18/20 11:21 AM  Specimen: BLOOD  Result Value Ref Range Status   Specimen Description BLOOD SITE NOT SPECIFIED  Final   Special Requests   Final    BOTTLES DRAWN AEROBIC ONLY Blood Culture results may not be optimal due to an inadequate volume of blood received in culture bottles   Culture   Final    NO GROWTH 3 DAYS Performed at [facility], [address] [place], [place] [address]    Report Status PENDING  Incomplete  Culture, blood (routine x 2)     Status: None (Preliminary result)   Collection Time: 02/18/20 11:21 AM   Specimen: BLOOD  Result Value Ref Range Status   Specimen Description BLOOD SITE NOT SPECIFIED  Final   Special Requests   Final    BOTTLES DRAWN AEROBIC ONLY Blood Culture results may not be optimal due to an inadequate volume of blood received in culture bottles   Culture   Final    NO GROWTH 3 DAYS Performed at [facility], [address], [place], [place] [address]    Report Status PENDING  Incomplete  Culture, Urine     Status: None   Collection Time: 02/18/20  7:46 PM   Specimen: Urine, Random  Result Value Ref Range Status   Specimen Description URINE, RANDOM  Final    Special Requests NONE  Final   Culture   Final    NO GROWTH Performed at [facility], [address] [place], [place] [address]    Report Status 02/20/2020 FINAL  Final        Radiology Studies: CT CHEST WO CONTRAST  Result Date: 02/20/2020 CLINICAL DATA:  COVID pneumonia EXAM: CT CHEST WITHOUT CONTRAST TECHNIQUE: Multidetector CT imaging of the chest was performed following the standard protocol without IV contrast. COMPARISON:  None. FINDINGS: Cardiovascular: Right upper extremity PICC, tip position near the superior cavoatrial junction. Aortic atherosclerosis. Mild cardiomegaly. Three-vessel coronary artery calcifications and/or stents. No pericardial effusion. Mediastinum/Nodes: Multiple benign calcified mediastinal and right hilar lymph nodes. Large hiatal hernia with intrathoracic position of the gastric body and fundus. Thyroid gland, trachea, and esophagus demonstrate no significant findings. Lungs/Pleura: Very extensive bilateral heterogeneous, ground-glass, and consolidative airspace disease throughout the lungs. Small bilateral pleural effusions and associated atelectasis or consolidation. Upper Abdomen: No acute abnormality. Musculoskeletal: No chest wall mass or suspicious bone lesions identified. IMPRESSION: 1. Very extensive bilateral heterogeneous, ground-glass, and consolidative airspace disease throughout the lungs. Small bilateral pleural effusions and associated atelectasis or consolidation. Findings are consistent with COVID-19 pneumonia. 2. Large hiatal hernia with intrathoracic position of the gastric body and fundus. 3. Coronary artery disease. Aortic Atherosclerosis (ICD10-I70.0). Electronically Signed   By: Lauralyn Primes M.D.   On: 02/20/2020 12:06        Scheduled Meds: . (feeding supplement) PROSource Plus  30 mL Oral TID WC  . apixaban  5 mg Oral BID  . vitamin C  500 mg Oral Daily  . calcium carbonate  1,250 mg Oral Q breakfast  . chlorhexidine  15 mL  Mouth Rinse BID  . Chlorhexidine Gluconate Cloth  6 each Topical Daily  . feeding supplement  1 Container Oral QID  . folic acid  1 mg Oral Daily  . gabapentin  100 mg Oral QHS  . insulin aspart  0-9 Units Subcutaneous TID WC  . lidocaine  1 patch Transdermal Q24H  . mouth rinse  15 mL Mouth Rinse BID  . oxyCODONE  10 mg Oral Q12H  .  pantoprazole  40 mg Oral Daily  . pravastatin  80 mg Oral q1800  . predniSONE  10 mg Oral Q breakfast   Followed by  . [START ON 02/23/2020] predniSONE  5 mg Oral Q breakfast  . vitamin B-12  1,000 mcg Oral Daily  . zinc sulfate  220 mg Oral Daily   Continuous Infusions: . sodium chloride Stopped (02/18/20 0622)  . vancomycin 1,000 mg (02/21/20 0520)     LOS: 25 days     Jacquelin Hawking, MD [facility] 02/21/2020, 9:10 AM  If 7PM-7AM, please contact night-coverage [email or web address]

## 2020-02-21 NOTE — Progress Notes (Signed)
   02/21/20 0408  Assess: MEWS Score  Temp 97.8 F (36.6 C)  BP 117/65  Pulse Rate (!) 103  ECG Heart Rate (!) 101  Resp (!) 27  Level of Consciousness Alert  SpO2 90 %  O2 Device HFNC  Patient Activity (if Appropriate) In bed  O2 Flow Rate (L/min) 15 L/min  Assess: MEWS Score  MEWS Temp 0  MEWS Systolic 0  MEWS Pulse 1  MEWS RR 2  MEWS LOC 0  MEWS Score 3  MEWS Score Color Yellow  Assess: if the MEWS score is Yellow or Red  Were vital signs taken at a resting state? Yes  Focused Assessment No change from prior assessment  Early Detection of Sepsis Score *See Row Information* High  MEWS guidelines implemented *See Row Information* Yes  Treat  MEWS Interventions Escalated (See documentation below)  Take Vital Signs  Increase Vital Sign Frequency  Yellow: Q 2hr X 2 then Q 4hr X 2, if remains yellow, continue Q 4hrs  Escalate  MEWS: Escalate Yellow: discuss with charge nurse/RN and consider discussing with provider and RRT  Notify: Charge Nurse/RN  Name of Charge Nurse/RN Notified Anna, RN  Date Charge Nurse/RN Notified 02/21/20  Time Charge Nurse/RN Notified 0500  Document  Patient Outcome Other (Comment) (pt remains on unit)  Progress note created (see row info) Yes   Pt has a yellow MEWS score for HR and RR. MD notified earlier in the night and PRN HR medication for HR sustaining above 120s. Pt currently is not c/o any pain and is resting in bed. No changes from prior assessments. Will cont to monitor and endorse to oncoming RN.

## 2020-02-21 NOTE — TOC Transition Note (Addendum)
Transition of Care [facility]) - CM/SW Discharge Note   Patient Details  Name: Javier Beard MRN: [id number] Date of Birth: [birth year]  Transition of Care [facility]) CM/SW Contact:  Lawerance Sabal, RN Phone Number: 02/21/2020, 11:50 AM   Clinical Narrative:   Received consult from Dr Caleb Popp to set up home hospice. Spoke w daughter Philipp Deputy, who identified [facility] for agency of choice.  Patient will need hospital bed, oxygen- high volume (currently on 15L high flow), bedside table and PTAR transport.  Referral placed to referral line, awaiting callback from [facility].    Final next level of care: Home w Hospice Care Barriers to Discharge: Continued Medical Work up   Patient Goals and CMS Choice Patient states their goals for this hospitalization and ongoing recovery are:: to return to home CMS Medicare.gov Compare Post Acute Care list provided to:: Patient Choice offered to / list presented to : Adult Children  Discharge Placement                       Discharge Plan and Services In-house Referral: Clinical Social Work Discharge Planning Services: CM Consult Post Acute Care Choice: Home Health                      [facility] Agency: [facility] Date [facility] Agency Contacted: 02/21/20 Time HH Agency Contacted: 1150 Representative spoke with at [facility] Agency: Revonda Standard on referral line  Social Determinants of Health (SDOH) Interventions     Readmission Risk Interventions No flowsheet data found.

## 2020-02-21 NOTE — Progress Notes (Signed)
   02/21/20 0800  Assess: MEWS Score  Temp 99.6 F (37.6 C)  BP 91/60  Pulse Rate (!) 117  ECG Heart Rate (!) 126  Resp (!) 22  Level of Consciousness Responds to Voice  SpO2 95 %  Assess: if the MEWS score is Yellow or Red  Were vital signs taken at a resting state? Yes  Focused Assessment No change from prior assessment  Early Detection of Sepsis Score *See Row Information* High  MEWS guidelines implemented *See Row Information* Yes  Treat  MEWS Interventions Escalated (See documentation below)  Pain Scale 0-10  Pain Score 8  Pain Type Chronic pain  Pain Intervention(s) Medication (See eMAR)  Take Vital Signs  Increase Vital Sign Frequency  Red: Q 1hr X 4 then Q 4hr X 4, if remains red, continue Q 4hrs  Escalate  MEWS: Escalate Red: discuss with charge nurse/RN and provider, consider discussing with RRT  Notify: Charge Nurse/RN  Name of Charge Nurse/RN Notified Erin RN  Date Charge Nurse/RN Notified 02/21/20  Time Charge Nurse/RN Notified 0830  Notify: Provider  Provider Name/Title Caleb Popp, MD  Date Provider Notified 02/21/20  Time Provider Notified 0830  Notification Type Page  Notification Reason Change in status  Response See new orders  Date of Provider Response 02/21/20  Time of Provider Response [phone]

## 2020-02-21 NOTE — Progress Notes (Signed)
Inpatient Rehabilitation Admissions Coordinator  Noted plans for home with hospice. We will sign off at this time.  Ottie Glazier, RN, MSN Rehab Admissions Coordinator [phone] 02/21/2020 1:03 PM

## 2020-02-22 DIAGNOSIS — U071 COVID-19: Secondary | ICD-10-CM | POA: Diagnosis not present

## 2020-02-22 DIAGNOSIS — Z515 Encounter for palliative care: Secondary | ICD-10-CM | POA: Diagnosis not present

## 2020-02-22 DIAGNOSIS — R0603 Acute respiratory distress: Secondary | ICD-10-CM | POA: Diagnosis not present

## 2020-02-22 DIAGNOSIS — J9601 Acute respiratory failure with hypoxia: Secondary | ICD-10-CM | POA: Diagnosis not present

## 2020-02-22 LAB — GLUCOSE, CAPILLARY: Glucose-Capillary: 123 mg/dL — ABNORMAL HIGH (ref 70–99)

## 2020-02-22 MED ORDER — MORPHINE SULFATE (CONCENTRATE) 20 MG/ML PO SOLN: 10.0000 mg | ORAL | 0 refills | Status: AC | PRN

## 2020-02-22 MED ORDER — VANCOMYCIN HCL 1500 MG/300ML IV SOLN: 1500.0000 mg | INTRAVENOUS | Status: DC

## 2020-02-22 MED ORDER — LORAZEPAM 0.5 MG PO TABS: 0.5000 mg | ORAL_TABLET | ORAL | 0 refills | Status: AC | PRN

## 2020-02-22 MED ORDER — VANCOMYCIN HCL 500 MG/100ML IV SOLN
500.0000 mg | INTRAVENOUS | Status: DC
  Filled 2020-02-22: qty 100

## 2020-02-23 LAB — CULTURE, BLOOD (ROUTINE X 2)
Culture: NO GROWTH
Culture: NO GROWTH

## 2020-03-12 ENCOUNTER — Ambulatory Visit: Payer: No Typology Code available for payment source | Admitting: Infectious Disease

## 2020-03-19 NOTE — Plan of Care (Signed)
  Problem: Education: Goal: Knowledge of risk factors and measures for prevention of condition will improve Outcome: Adequate for Discharge   Problem: Coping: Goal: Psychosocial and spiritual needs will be supported Outcome: Adequate for Discharge   Problem: Respiratory: Goal: Will maintain a patent airway Outcome: Adequate for Discharge Goal: Complications related to the disease process, condition or treatment will be avoided or minimized Outcome: Adequate for Discharge   Problem: Education: Goal: Knowledge of General Education information will improve Description: Including pain rating scale, medication(s)/side effects and non-pharmacologic comfort measures Outcome: Adequate for Discharge   Problem: Health Behavior/Discharge Planning: Goal: Ability to manage health-related needs will improve Outcome: Adequate for Discharge   Problem: Clinical Measurements: Goal: Ability to maintain clinical measurements within normal limits will improve Outcome: Adequate for Discharge Goal: Will remain free from infection Outcome: Adequate for Discharge Goal: Diagnostic test results will improve Outcome: Adequate for Discharge Goal: Respiratory complications will improve Outcome: Adequate for Discharge Goal: Cardiovascular complication will be avoided Outcome: Adequate for Discharge   Problem: Activity: Goal: Risk for activity intolerance will decrease Outcome: Adequate for Discharge   Problem: Nutrition: Goal: Adequate nutrition will be maintained Outcome: Adequate for Discharge   Problem: Coping: Goal: Level of anxiety will decrease Outcome: Adequate for Discharge

## 2020-03-19 NOTE — Progress Notes (Signed)
Pharmacy Antibiotic Note  Javier Beard is a 80 y.o. male admitted on 2020-01-31 with MSSA bacteremia and septic arthritis.Marland Kitchen  Pharmacy has been consulted for Vanco dosing.  ID: Cefazolin>>Vanco for MSSA bacteremia and septic arthritis. Trxt 6wks.PICC 02/16/20.  - COVID+ s/p Remdesivir. 6 wks abx.  MRI 1/25 - ?discitis.  - Hx chronic immunosuppression for RA (Pred 5, DMARDs) - Tmax 101.2, WBC wnl, CRP 24.4 up. Scr down 1.06 (previously 0.62)  Remdesivir 1/7 >> 1/11 Zyvox 1/21 x 1 Cefepime 1/21 x 1 Cefazolin 1/22 >>1/25 1/25 Vanc >>3/3  1/7 COVID: positive 1/7 flu: negative 1/7 blood x 2: negativeF 1/21 MRSA PCR >> neg 1/21 BCx >> 3/3 MSSA 1/22 BCx >> ngtdF 1/30 blood>>ngtd  1/27 VP 36, VT 13>> AUC in range, continue 1gm q12h 2/1: Vanco 1g/24h due to Scr 1.12 2/3: Incr Vanco 1500mg  IV q24h due to Scr 1.06 + new fever   Plan: Increase Vanco to 1500mg  IV q24h (AUC 483, Scr 1.06) Noticed not about hospice. Will not yet change OPAT as abx likely to be d/c'd altogether.    Height: 5\' 6"  (167.6 cm) Weight: 79.6 kg (175 lb 7.8 oz) IBW/kg (Calculated) : 63.8  Temp (24hrs), Avg:100.1 F (37.8 C), Min:98.8 F (37.1 C), Max:101.2 F (38.4 C)  Recent Labs  Lab 02/15/20 1908 02/16/20 0204 02/16/20 1107 02/17/20 0421 02/18/20 0345 02/20/20 0330 02/21/20 0420  WBC  --  7.3  --  7.1 7.1 7.5 9.8  CREATININE  --  0.53*  --  0.62  --  1.12 1.06  LATICACIDVEN  --   --  1.9  --   --   --   --   VANCOTROUGH  --  18  --   --   --   --   --   VANCOPEAK 36  --   --   --   --   --   --     Estimated Creatinine Clearance: 57.9 mL/min (by C-G formula based on SCr of 1.06 mg/dL).    Allergies  Allergen Reactions  . Cefazolin Other (See Comments)    thrombotopenia     Javier Beard S. 02/20/20, PharmD, BCPS Clinical Staff Pharmacist [email or web address] 04/19/20 02/21/2020 10:12 AM

## 2020-03-19 NOTE — Discharge Summary (Signed)
Physician Discharge Summary  EARLIE SCHANK ZOX:[id number] DOB: 11-Nov-1940 DOA: February 05, 2020  PCP: Lenox Ponds, MD  Admit date: 02/05/2020 Discharge date: 03/08/2020  Admitted From: Home Disposition: Home with hospice  Recommendations for Outpatient Follow-up:  1. Home with hospice  Discharge Condition: Stable CODE STATUS: DNR Diet recommendation: Comfort measures   Brief/Interim Summary:  Admission HPI written by Eduard Clos, MD   Chief Complaint: Shortness of breath.  HPI: LAEL PILCH is a 80 y.o. male with history of CAD status post stenting, permanent atrial fibrillation, history of bradycardia, hypertension, diabetes mellitus, bilateral lower extremity edema has been experiencing fatigue weakness over the last week and a half and over the last 3 days has been having increasing shortness of breath.  Patient also had some nausea vomiting mid back pain and diarrhea which has resolved at this time.  Given the symptoms patient presents to the ER.  Hospital course:  Acute respiratory failure with hypoxia Secondary to COVID-19 pneumonia. Patient still requiring significant amounts of oxygen and is currently on 15 L of HFNC in addition to NRB. Continue oxygen for comfort.  COVID-19 pneumonia Patient was treated with Remdesivir and prednisone. Also received antibiotics but for below problems. Patient no longer requiring isolation.  Septic shock Not present on admission and secondary to bacteremia/pneumonia/septic arthritis. Patient required Levophed for support which has been weaned off. Hypotension today which has responded to IV fluids.  MSSA bacteremia MSSA pneumonia L4-L5 septic arthritis Epidural phlegmon Soft tissue abscess Patient with positive blood cultures for MSSA and was started on Cefazolin IV which was changed to Vancomycin IV. ID consulted. MRI imaging found evidence of associated spinal infection. Recommendation for 6 weeks of IV antibiotics per  infectious disease. Now comfort measures so will discontinue antibiotic therapy.  Poor oral intake Attempt to place Cortrak feeding tube failed with patient declining further attempts at feeding tube placement.   Rheumatoid arthritis Patient is on methotrexate, Simponi, Plaquenil as an outpatient which we held. He is also on prednisone. Comfort measures. Hospice care.  Diabetes mellitus, type 2 Uncontrolled with hypo-/hyperglycemia.  Atrial fibrillation with RVR In setting of soft BP. Improved with IV fluids -Continue metoprolol IV prn  AKI Baseline creatinine of 1. Peak of 1.88 and creatinine is back to baseline. AKI resolved.  Normocytic anemia Hemoglobin stable.  Chronic diastolic heart failure Stable.  Thrombocytopenia Possibly secondary to sepsis/infection vs medication effect. Cefazolin was transitioned to Vancomycin.  Acute metabolic encephalopathy In setting of sepsis/renal failure/hypoxemia. Currently stable.  Pressure injury Medial sacrum. POA.  Constipation Resolved with bowel regimen.  Dysphagia SLP recommending dysphagia 3 diet.  Discharge Diagnoses:  Principal Problem:   Acute hypoxemic respiratory failure due to COVID-19 [facility]) Active Problems:   COVID-19 virus infection   Type 2 diabetes mellitus with vascular disease (HCC)   Rheumatoid arthritis (HCC)   PAF (paroxysmal atrial fibrillation) (HCC)   CAD (coronary artery disease)   Pneumonia due to COVID-19 virus   Hyponatremia   Multifocal pneumonia   Acute respiratory distress   Acute respiratory failure with hypoxia (HCC)   MSSA bacteremia   Hypoxia   Thrombocytopenia (HCC)   Pressure injury of skin    Discharge Instructions  Discharge Instructions    Diet - low sodium heart healthy   Complete by: As directed    Diet - low sodium heart healthy   Complete by: As directed    Discharge instructions   Complete by: As directed    It is important  that you read the instructions  as well as go over your medication list with RN to help you understand your care after this hospitalization.  Please follow-up with PCP in 1-2 weeks.  Please note that we are unable to authorize any refills for discharge medications, once you are discharged. Thus, it is imperative that you return to your primary care physician (or establish a relationship with a primary care physician if you do not have one) for your care needs. So that they can reassess your need for medications and monitor your lab values.  Please request your primary care physician to go over all Hospital Tests and Procedure/Radiological results at the follow up. Please get all Hospital records sent to your PCP by signing hospital release before you go home.   Do not take more than prescribed Pain, Sleep and Anxiety Medications.  You were cared for by a hospitalist during your hospital stay. If you have any questions about your discharge medications or the care you received while you were in the hospital after you are discharged, you can call the hospital unit/floor you were admitted to and ask to speak with the hospitalist who took care of you. Ask for Hospitalist on call, if the hospitalist that took care of you is not available. Once you are discharged, your primary care physician will help you with any further medical issues. You Must read complete instructions/literature along with all the possible adverse reactions/side effects for all the Medicines you take and that have been prescribed to you. Take any new Medicines after you have completely understood and accept all the possible adverse reactions/side effects.   Increase activity slowly   Complete by: As directed    Increase activity slowly   Complete by: As directed    No wound care   Complete by: As directed      Allergies as of 03/09/2020      Reactions   Cefazolin Other (See Comments)   thrombotopenia      Medication List    STOP taking these medications    acetaminophen 650 MG CR tablet Commonly known as: TYLENOL   calcium carbonate 1500 (600 Ca) MG Tabs tablet Commonly known as: OSCAL   denosumab 60 MG/ML Sosy injection Commonly known as: PROLIA   doxazosin 1 MG tablet Commonly known as: CARDURA   Eliquis 5 MG Tabs tablet Generic drug: apixaban   ergocalciferol 1.25 MG (50000 UT) capsule Commonly known as: VITAMIN D2   folic acid 1 MG tablet Commonly known as: FOLVITE   furosemide 20 MG tablet Commonly known as: LASIX   glipiZIDE 5 MG tablet Commonly known as: GLUCOTROL   golimumab 50 MG/4ML Soln injection Commonly known as: SIMPONI ARIA   hydroxychloroquine 200 MG tablet Commonly known as: PLAQUENIL   losartan 50 MG tablet Commonly known as: COZAAR   methotrexate 2.5 MG tablet   pantoprazole 40 MG tablet Commonly known as: PROTONIX   potassium chloride 10 MEQ tablet Commonly known as: KLOR-CON   pravastatin 80 MG tablet Commonly known as: PRAVACHOL   predniSONE 5 MG tablet Commonly known as: DELTASONE   vitamin B-12 1000 MCG tablet Commonly known as: CYANOCOBALAMIN     TAKE these medications   albuterol 108 (90 Base) MCG/ACT inhaler Commonly known as: VENTOLIN HFA Inhale 2 puffs into the lungs every 4 (four) hours as needed for wheezing or shortness of breath.   diclofenac Sodium 1 % Gel Commonly known as: VOLTAREN Apply 1 application topically daily as needed for  pain.   LORazepam 0.5 MG tablet Commonly known as: ATIVAN Take 1 tablet (0.5 mg total) by mouth every 4 (four) hours as needed for anxiety. May crush, mix with water and give sublingually if needed.   morphine 20 MG/ML concentrated solution Commonly known as: ROXANOL Take 0.5 mLs (10 mg total) by mouth every 4 (four) hours as needed for severe pain, breakthrough pain or shortness of breath. May give sublingually if needed.            Durable Medical Equipment  (From admission, onward)         Start     Ordered   01/29/20  1533  For home use only DME oxygen  Once       Question Answer Comment  Length of Need 6 Months   Mode or (Route) Nasal cannula   Liters per Minute 4   Frequency Continuous (stationary and portable oxygen unit needed)   Oxygen conserving device Yes   Oxygen delivery system Gas      01/29/20 1532          Follow-up Information    Randel Pigg, Dorma Russell, MD. Schedule an appointment as soon as possible for a visit in 1 week(s).   Specialty: Family Medicine Contact information: [phone] [address] [place] [address] [phone]        [facility] Follow up.   Specialty: PALLIATIVE CARE Why: for home hospice services Contact information: [address] [facility] [address] [phone]             Allergies  Allergen Reactions  . Cefazolin Other (See Comments)    thrombotopenia    Consultations:  PCCM   Procedures/Studies: DG Chest 2 View  Result Date: 02-04-2020 CLINICAL DATA:  Shortness of breath, cough. EXAM: CHEST - 2 VIEW COMPARISON:  April 28, 2019 and February 03, 2019 FINDINGS: Cardiac enlargement similar to the prior study. Eventration of the RIGHT hemidiaphragm is also similar. Large hiatal hernia behind the heart. Patchy opacities in the bilateral chest with basilar and mid lung predominance. No lobar consolidative changes. No sign of pleural effusion. On limited assessment no acute skeletal process. IMPRESSION: 1. Multifocal opacities suspicious for multifocal pneumonia, consider COVID 19 infection. 2. Large hiatal hernia. 3. Cardiomegaly as before. Electronically Signed   By: Donzetta Kohut M.D.   On: 02-04-2020 14:33   CT HEAD WO CONTRAST  Result Date: 02/09/2020 CLINICAL DATA:  Altered mental status.  COVID positive EXAM: CT HEAD WITHOUT CONTRAST TECHNIQUE: Contiguous axial images were obtained from the base of the skull through the vertex without intravenous contrast. COMPARISON:  MRI head 12/31/2015 FINDINGS:  Brain: Motion degraded study. Mild atrophy. Patchy white matter hypodensity bilaterally appears chronic and similar to the prior MRI. Negative for acute infarct, acute hemorrhage, or mass lesion. Vascular: Negative for hyperdense vessel Skull: Negative Sinuses/Orbits: Paranasal sinuses clear.  Negative orbit Other: None IMPRESSION: Image quality degraded by significant motion. No acute abnormality identified. Electronically Signed   By: Marlan Palau M.D.   On: 02/09/2020 02:10   CT CHEST WO CONTRAST  Result Date: 02/20/2020 CLINICAL DATA:  COVID pneumonia EXAM: CT CHEST WITHOUT CONTRAST TECHNIQUE: Multidetector CT imaging of the chest was performed following the standard protocol without IV contrast. COMPARISON:  None. FINDINGS: Cardiovascular: Right upper extremity PICC, tip position near the superior cavoatrial junction. Aortic atherosclerosis. Mild cardiomegaly. Three-vessel coronary artery calcifications and/or stents. No pericardial effusion. Mediastinum/Nodes: Multiple benign calcified mediastinal and right hilar  lymph nodes. Large hiatal hernia with intrathoracic position of the gastric body and fundus. Thyroid gland, trachea, and esophagus demonstrate no significant findings. Lungs/Pleura: Very extensive bilateral heterogeneous, ground-glass, and consolidative airspace disease throughout the lungs. Small bilateral pleural effusions and associated atelectasis or consolidation. Upper Abdomen: No acute abnormality. Musculoskeletal: No chest wall mass or suspicious bone lesions identified. IMPRESSION: 1. Very extensive bilateral heterogeneous, ground-glass, and consolidative airspace disease throughout the lungs. Small bilateral pleural effusions and associated atelectasis or consolidation. Findings are consistent with COVID-19 pneumonia. 2. Large hiatal hernia with intrathoracic position of the gastric body and fundus. 3. Coronary artery disease. Aortic Atherosclerosis (ICD10-I70.0). Electronically Signed    By: Lauralyn Primes M.D.   On: 02/20/2020 12:06   CT ANGIO CHEST PE W OR WO CONTRAST  Result Date: 02/02/2020 CLINICAL DATA:  High probability pulmonary embolism.  COVID. EXAM: CT ANGIOGRAPHY CHEST WITH CONTRAST TECHNIQUE: Multidetector CT imaging of the chest was performed using the standard protocol during bolus administration of intravenous contrast. Multiplanar CT image reconstructions and MIPs were obtained to evaluate the vascular anatomy. CONTRAST:  51mL OMNIPAQUE IOHEXOL 350 MG/ML SOLN COMPARISON:  02/03/2019 FINDINGS: Cardiovascular: Heart size upper limits normal. Trace pericardial fluid. Satisfactory opacification of pulmonary arteries noted, and there is no evidence of pulmonary emboli. Breathing motion especially through the bases degrades peripheral evaluation. 3-vessel coronary calcifications. Fair contrast opacification of the thoracic aorta. No aneurysm, dissection, or stenosis. Classic 3 vessel brachiocephalic arterial origin anatomy with nonocclusive calcified plaque at the left subclavian artery origin. Scattered calcified plaque in the descending thoracic and proximal abdominal aorta. Mediastinum/Nodes: Large hiatal hernia containing gastric fundus and much of the body. Calcified right paratracheal lymph node. No new adenopathy. Lungs/Pleura: Trace left pleural effusion. Previously noted small right pleural effusion has resolved. No pneumothorax. Extensive scattered patchy predominantly peripheral airspace and ground-glass opacities most marked in both lower lobes, and the left upper lobe. Upper Abdomen: Moderate hiatal hernia. Punctate calcified granulomas in the spleen. No acute findings. Musculoskeletal: Spondylitic changes in the lower cervical and thoracic spine. Bilateral shoulder DJD. No fracture or worrisome bone lesion. Review of the MIP images confirms the above findings. IMPRESSION: 1. Extensive patchy predominantly peripheral airspace and ground-glass opacities most marked in both  lower lobes and the left upper lobe, consistent with COVID pneumonia . 2. Negative for acute PE or thoracic aortic dissection. 3. Trace left pleural effusion. 4. Large hiatal hernia. 5. Coronary and Aortic Atherosclerosis (ICD10-I70.0). Electronically Signed   By: Corlis Leak M.D.   On: 02/02/2020 15:59   MR CERVICAL SPINE W WO CONTRAST  Result Date: 02/12/2020 CLINICAL DATA:  Epidural abscess. EXAM: MRI CERVICAL AND THORACIC SPINE WITHOUT AND WITH CONTRAST TECHNIQUE: Multiplanar and multiecho pulse sequences of the cervical spine, to include the craniocervical junction and cervicothoracic junction, and the thoracic spine, were obtained without and with intravenous contrast. CONTRAST:  29mL GADAVIST GADOBUTROL 1 MMOL/ML IV SOLN COMPARISON:  None. FINDINGS: MRI CERVICAL SPINE FINDINGS Images are significantly degraded by motion, particularly axial views. Alignment: Straightening of the cervical curvature. Small anterolisthesis of C2 over C3 and small retrolisthesis of C3 over C4. Vertebrae: No fracture, evidence of discitis, or bone lesion. Cord: No gross cord signal abnormality. Posterior Fossa, vertebral arteries, paraspinal tissues: Negative. Disc levels: C2-3: Facet degenerative changes resulting mild left neural foraminal narrowing. No spinal canal stenosis. C3-4: Posterior disc protrusion resulting in mild spinal canal stenosis. Uncovertebral and facet degenerative change resulting in moderate bilateral neural foraminal narrowing. C4-5: Posterior disc protrusion resulting  in mild spinal canal stenosis. Uncovertebral and facet degenerative changes resulting in severe right and moderate left neural foraminal narrowing. C5-6: Posterior disc protrusion resulting mild spinal canal stenosis. Uncovertebral and facet degenerative changes resulting in moderate bilateral neural foraminal narrowing. C6-7: Posterior disc protrusion resulting mild spinal canal stenosis. Uncovertebral and facet degenerative changes  resulting in mild bilateral neural foraminal narrowing. C7-T1: No spinal canal or neural foraminal stenosis. MRI THORACIC SPINE FINDINGS Alignment:  Mildly exaggerated thoracic kyphosis. Vertebrae: Endplate irregularity and sclerotic changes at T7-8 with T2 hyperintensity and contrast enhancement of the corresponding intervertebral disc. Mild chronic wedging of T6 and T11. Cord:  Normal signal and morphology. Paraspinal and other soft tissues: Bilateral pleural effusions, left greater than right. Disc levels: T1-2: No spinal canal or neural foraminal stenosis. T2-3:  No spinal canal or neural foraminal stenosis. T3-4:  No spinal canal or neural foraminal stenosis. T4-5: Posterior disc protrusion without significant spinal canal or neural foraminal stenosis. T5-6: Left posterolateral disc protrusion without significant spinal canal or neural foraminal stenosis. T6-7:  No spinal canal or neural foraminal stenosis. T7-8: Posterior disc protrusion resulting mild bilateral neural foraminal narrowing. No significant spinal canal stenosis. T8-9: Left posterolateral disc protrude causing indentation on the thecal sac, abutting the anterior cord surface resulting in mild left neural foraminal narrowing. No significant spinal canal stenosis. T9-10:  No spinal canal or neural foraminal stenosis. T10-11:  No spinal canal or neural foraminal stenosis. T11-12:  No spinal canal or neural foraminal stenosis. IMPRESSION: 1. Motion degraded study, particularly cervical axial images. 2. Endplate irregularity and sclerotic changes at T7-8 with T2 hyperintensity and contrast enhancement of the corresponding intervertebral disc, may be degenerative versus discitis/osteomyelitis. 3. 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 4. No high-grade spinal canal stenosis at any level. 5. Bilateral pleural effusions, left greater than right. Electronically Signed   By: Baldemar Lenis M.D.   On: 02/12/2020 12:59   MR THORACIC SPINE W WO CONTRAST  Result Date: 02/12/2020 CLINICAL DATA:  Epidural abscess. EXAM: MRI CERVICAL AND THORACIC SPINE WITHOUT AND WITH CONTRAST TECHNIQUE: Multiplanar and multiecho pulse sequences of the cervical spine, to include the craniocervical junction and cervicothoracic junction, and the thoracic spine, were obtained without and with intravenous contrast. CONTRAST:  8mL GADAVIST GADOBUTROL 1 MMOL/ML IV SOLN COMPARISON:  None. FINDINGS: MRI CERVICAL SPINE FINDINGS Images are significantly degraded by motion, particularly axial views. Alignment: Straightening of the cervical curvature. Small anterolisthesis of C2 over C3 and small retrolisthesis of C3 over C4. Vertebrae: No fracture, evidence of discitis, or bone lesion. Cord: No gross cord signal abnormality. Posterior Fossa, vertebral arteries, paraspinal tissues: Negative. Disc levels: C2-3: Facet degenerative changes resulting mild left neural foraminal narrowing. No spinal canal stenosis. C3-4: Posterior disc protrusion resulting in mild spinal canal stenosis. Uncovertebral and facet degenerative change resulting in moderate bilateral neural foraminal narrowing. C4-5: Posterior disc protrusion resulting in mild spinal canal stenosis. Uncovertebral and facet degenerative changes resulting in severe right and moderate left neural foraminal narrowing. C5-6: Posterior disc protrusion resulting mild spinal canal stenosis. Uncovertebral and facet degenerative changes resulting in moderate bilateral neural foraminal narrowing. C6-7: Posterior disc protrusion resulting mild spinal canal stenosis. Uncovertebral and facet degenerative changes resulting in mild bilateral neural foraminal narrowing. C7-T1: No spinal canal or neural foraminal stenosis. MRI THORACIC SPINE FINDINGS Alignment:  Mildly exaggerated thoracic kyphosis. Vertebrae: Endplate irregularity and sclerotic changes at T7-8 with T2  hyperintensity and  contrast enhancement of the corresponding intervertebral disc. Mild chronic wedging of T6 and T11. Cord:  Normal signal and morphology. Paraspinal and other soft tissues: Bilateral pleural effusions, left greater than right. Disc levels: T1-2: No spinal canal or neural foraminal stenosis. T2-3:  No spinal canal or neural foraminal stenosis. T3-4:  No spinal canal or neural foraminal stenosis. T4-5: Posterior disc protrusion without significant spinal canal or neural foraminal stenosis. T5-6: Left posterolateral disc protrusion without significant spinal canal or neural foraminal stenosis. T6-7:  No spinal canal or neural foraminal stenosis. T7-8: Posterior disc protrusion resulting mild bilateral neural foraminal narrowing. No significant spinal canal stenosis. T8-9: Left posterolateral disc protrude causing indentation on the thecal sac, abutting the anterior cord surface resulting in mild left neural foraminal narrowing. No significant spinal canal stenosis. T9-10:  No spinal canal or neural foraminal stenosis. T10-11:  No spinal canal or neural foraminal stenosis. T11-12:  No spinal canal or neural foraminal stenosis. IMPRESSION: 1. Motion degraded study, particularly cervical axial images. 2. Endplate irregularity and sclerotic changes at T7-8 with T2 hyperintensity and contrast enhancement of the corresponding intervertebral disc, may be degenerative versus discitis/osteomyelitis. 3. 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 4. No high-grade spinal canal stenosis at any level. 5. Bilateral pleural effusions, left greater than right. Electronically Signed   By: Baldemar Lenis M.D.   On: 02/12/2020 12:59   MR Lumbar Spine W Wo Contrast  Result Date: 02/15/2020 CLINICAL DATA:  Initial evaluation for back pain, epidural abscess suspected. EXAM: MRI LUMBAR SPINE WITHOUT AND WITH CONTRAST TECHNIQUE: Multiplanar  and multiecho pulse sequences of the lumbar spine were obtained without and with intravenous contrast. CONTRAST:  7.26mL GADAVIST GADOBUTROL 1 MMOL/ML IV SOLN COMPARISON:  Prior MRI from 08/02/2015. FINDINGS: Segmentation: Standard. Lowest well-formed disc space labeled the L5-S1 level. 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 Additional trace 2 mm anterolisthesis of L4 on L5. 3 mm retrolisthesis of L1 on L2. Vertebrae: Mild chronic anterior wedging deformity noted at the superior endplate of T11. Vertebral body height otherwise maintained without acute or subacute fracture. Underlying bone marrow signal intensity within normal limits. No discrete or worrisome osseous lesions. No abnormal marrow edema or enhancement. No findings to suggest osteomyelitis discitis. There is a prominent joint effusion involving the right L4-5 facet (series 11, image 26). Prominent surrounding soft tissue edema and enhancement. Constellation of findings highly suspicious for acute septic arthritis with associated myositis/soft tissue infection. Several T2 hyperintense irregular rim enhancing collections seen within the posterior soft tissues immediately posterior to the right L4-5 facet, consistent with abscess. Largest of these collections measures 1.6 x 1.5 x 2.9 cm (series 10, image 3). An adjacent 1.9 cm collection appears to communicate with the right L4-5 facet itself (series 8, image 25). Small joint effusion with mild soft tissue edema about the contralateral left L4-5 facet as well, which could also be involved. Soft tissue signal intensity within the right posterior and dorsal epidural space likely reflects mild phlegmon (series 9, image 26). No frank epidural abscess at this time. Small joint effusions noted within the bilateral L3-4 facets as well without convincing evidence for septic arthritis. Conus medullaris and cauda equina: Conus extends to the L1 level. Conus  and cauda equina appear normal. Paraspinal and other soft tissues: No other acute abnormality within the paraspinous soft tissues. Asymmetric atrophy noted within the left psoas muscle.  Multifocal atheromatous irregularity noted throughout the visualized aorta. Visualized visceral structures otherwise unremarkable. Disc levels: L1-2: Retrolisthesis. Diffuse disc bulge with disc desiccation and intervertebral disc space narrowing. Disc bulging eccentric to the left. Mild facet hypertrophy. Mild narrowing of the left lateral recess without significant spinal stenosis. Mild left L1 foraminal narrowing. Right neural foramen remains patent. L2-3: Mild disc bulge with disc desiccation. Mild facet hypertrophy. No significant spinal stenosis. Foramina remain patent. L3-4: Mild disc bulge with disc desiccation. Mild to moderate bilateral facet hypertrophy with associated trace joint effusions. No significant spinal stenosis. Foramina remain patent. L4-5: Trace anterolisthesis. Mild disc bulge with disc desiccation. Moderate bilateral facet hypertrophy with associated joint effusions. Findings of septic arthritis on the right. No significant spinal stenosis. Mild bilateral L4 foraminal narrowing. 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 Degenerative intervertebral disc space narrowing with disc desiccation and broad posterior pseudo disc bulge. Severe bilateral facet arthrosis no significant spinal stenosis. Severe right with moderate left L5 foraminal stenosis. IMPRESSION: 1. Findings consistent with acute septic arthritis involving the right L4-5 facet. Associated edema and enhancement within the adjacent posterior paraspinous musculature consistent with associated soft tissue infection/myositis. Superimposed multifocal soft tissue abscesses measuring up to 2.9 cm as above. Trace epidural phlegmon without frank epidural abscess at this time. 2. 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 3. Additional mild multilevel noncompressive disc bulging elsewhere within the lumbar spine as above. No other high-grade stenosis or evidence for neural impingement. Electronically Signed   By: Rise Mu M.D.   On: 02/15/2020 03:45   DG CHEST PORT 1 VIEW  Result Date: 02/18/2020 CLINICAL DATA:  COVID-19 pneumonia with hypoxia, type II diabetes mellitus, atrial fibrillation, cough and shortness of breath EXAM: PORTABLE CHEST 1 VIEW COMPARISON:  Portable exam 0930 hours compared to 02/16/2020 FINDINGS: RIGHT arm PICC line tip projects over SVC. Upper normal size of cardiac silhouette. Stable mediastinal contours. Extensive BILATERAL airspace infiltrates consistent with multifocal pneumonia, greater on LEFT, minimally increased in RIGHT upper lobe. Question small RIGHT pleural effusion. No pneumothorax. Bones demineralized. IMPRESSION: BILATERAL pulmonary infiltrates LEFT greater than RIGHT consistent with multifocal pneumonia and COVID-19, minimally increased in RIGHT upper lobe. Electronically Signed   By: Ulyses Southward M.D.   On: 02/18/2020 11:45   DG CHEST PORT 1 VIEW  Result Date: 02/16/2020 CLINICAL DATA:  Central catheter placement. Reported COVID-19 positive EXAM: PORTABLE CHEST 1 VIEW COMPARISON:  February 13, 2020 FINDINGS: Central catheter tip is in the superior vena cava. No pneumothorax. Extensive airspace opacity consistent with multifocal pneumonia is seen throughout the left lung. There is airspace opacity with consolidation in portions of the right mid and lower lung regions. Heart is mildly enlarged with pulmonary vascularity normal. No adenopathy. No bone lesions. 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 Widespread multifocal airspace opacity is again noted, more extensive on the left than on the right, consistent with multifocal pneumonia.  Suspect atypical organism pneumonia, although there may well be bacterial superinfection in areas of superimposed consolidation. Stable cardiac prominence. Electronically Signed   By: Bretta Bang III M.D.   On: 02/16/2020 10:03   DG CHEST PORT 1 VIEW  Result Date: 02/13/2020 CLINICAL DATA:  80 year old male with hypoxia.  COVID-19. EXAM: PORTABLE CHEST 1 VIEW COMPARISON:  Chest CTA 02/02/2020. Portable chest 02/10/2020 and earlier. FINDINGS: Portable AP semi upright view at 0431 hours. The patient  is less rotated today. Lung volumes are improved. Mediastinal contour appears stable with moderate to large hiatal hernia demonstrated by CTA. Visualized tracheal air column is within normal limits. Left greater than right confluent bilateral pulmonary opacity. Right upper lobe remains relatively spared. Ventilation is stable. No pneumothorax or definite pleural effusion. Stable visualized osseous structures. IMPRESSION: Stable COVID-19 pneumonia since 02/10/2020. Electronically Signed   By: Odessa Fleming M.D.   On: 02/13/2020 05:45   DG Chest Port 1 View  Result Date: 02/10/2020 CLINICAL DATA:  Abnormal respiration EXAM: PORTABLE CHEST 1 VIEW COMPARISON:  Yesterday FINDINGS: Pneumonia on the left more than right with volume loss on the left, unchanged. Cardiomegaly accentuated by rotation. Calcified mediastinal lymph nodes. No visible effusion or pneumothorax. Extensive artifact from EKG leads. IMPRESSION: Unchanged multifocal pneumonia. Electronically Signed   By: Marnee Spring M.D.   On: 02/10/2020 05:54   DG Chest Port 1 View  Result Date: 02/09/2020 CLINICAL DATA:  Dyspnea EXAM: PORTABLE CHEST 1 VIEW COMPARISON:  Yesterday FINDINGS: Bilateral pneumonia which is worse on the left where there is less inflation. Cardiomegaly with hiatal hernia by CT. No visible effusion or pneumothorax. IMPRESSION: Unchanged bilateral pneumonia which is asymmetric to the left. Electronically Signed   By: Marnee Spring  M.D.   On: 02/09/2020 09:02   DG CHEST PORT 1 VIEW  Result Date: 02/08/2020 CLINICAL DATA:  Acute respiratory failure with hypoxia EXAM: PORTABLE CHEST 1 VIEW COMPARISON:  CT 02/02/2020, radiograph 01/31/2020 FINDINGS: Persistent heterogeneous areas of mixed consolidative and interstitial opacity throughout both lungs, left greater than right. No visible pneumothorax or effusion. Telemetry leads overlie the chest. Probable cardiomegaly though portions of the cardiac silhouette are obscured. Hiatal hernia is again seen. The remaining visible cardiomediastinal contours are unremarkable aside from a calcified aorta. No acute osseous or soft tissue abnormality. Degenerative changes are present in the imaged spine and shoulders. Telemetry leads overlie the chest. IMPRESSION: Persistent heterogeneous areas of mixed consolidative and interstitial opacity throughout both lungs, left greater than right. Findings concerning for persisting infection in the setting of COVID-19. Stable cardiomegaly. Hiatal hernia. Electronically Signed   By: Kreg Shropshire M.D.   On: 02/08/2020 20:49   DG Chest Port 1 View  Result Date: 01/31/2020 CLINICAL DATA:  Rapid response, acute respiratory failure due to COVID 19 EXAM: PORTABLE CHEST 1 VIEW COMPARISON:  Radiograph 02-05-20, CT 02/03/2019 FINDINGS: Increasing heterogeneous opacities throughout both lungs, most focally coalescent in the left lung periphery and bilateral bases. No pneumothorax. No visible pleural effusion. Stable cardiomegaly. Hiatal hernia again noted. Remaining cardiomediastinal contours are unremarkable. No acute osseous or soft tissue abnormality. Degenerative changes are present in the imaged spine and shoulders. Telemetry leads overlie the chest. IMPRESSION: Increasing heterogeneous opacities throughout both lungs, concerning for worsening infection in the setting of COVID-19 Stable cardiomegaly. Stable hiatal hernia. Electronically Signed   By: Kreg Shropshire  M.D.   On: 01/31/2020 03:59   DG Knee Left Port  Result Date: 02/15/2020 CLINICAL DATA:  Left knee pain, no known injury, initial encounter EXAM: PORTABLE LEFT KNEE - 1-2 VIEW COMPARISON:  None. FINDINGS: Tricompartmental degenerative changes are noted worst in the medial joint space. No joint effusion is seen. No acute fracture or dislocation is noted. IMPRESSION: Degenerative change without acute abnormality. Electronically Signed   By: Alcide Clever M.D.   On: 02/15/2020 10:59   DG Knee Right Port  Result Date: 02/15/2020 CLINICAL DATA:  Knee pain, no known injury, initial encounter EXAM: PORTABLE RIGHT  KNEE - 2 VIEW COMPARISON:  None. FINDINGS: Tricompartmental degenerative changes are noted. No joint effusion is seen. No acute fracture or dislocation is noted. IMPRESSION: Degenerative change without acute abnormality. Electronically Signed   By: Alcide Clever M.D.   On: 02/15/2020 10:58   DG Abd Portable 1V  Result Date: 02/16/2020 CLINICAL DATA:  Abdominal pain EXAM: PORTABLE ABDOMEN - 1 VIEW COMPARISON:  None. FINDINGS: There is moderate stool in the colon. There is no bowel dilatation or air-fluid level to suggest bowel obstruction. No free air. Total hip replacement on the right. Moderate narrowing left hip joint. Infiltrate noted in the lung bases. IMPRESSION: Moderate stool in colon. No bowel obstruction or free air evident. Arthropathy left hip joint. Infiltrate in lung bases. Electronically Signed   By: Bretta Bang III M.D.   On: 02/16/2020 10:04   ECHOCARDIOGRAM COMPLETE  Result Date: 01/31/2020    ECHOCARDIOGRAM REPORT   Patient Name:   JOHNATTAN STRASSMAN Date of Exam: 01/31/2020 Medical Rec #:  [id number]     Height:       66.0 in Accession #:    [id number]    Weight:       180.0 lb Date of Birth:  [birth year]     BSA:          1.912 m Patient Age:    79 years      BP:           135/65 mmHg Patient Gender: M             HR:           75 bpm. Exam Location:  Inpatient Procedure: 2D Echo,  Color Doppler and Cardiac Doppler Indications:    Dyspnea R06.00  History:        Patient has no prior history of Echocardiogram examinations.                 CAD, Arrythmias:Atrial Fibrillation; Risk Factors:Hypertension                 and Diabetes.  Sonographer:    Eulah Pont RDCS Referring Phys: [id number] Aliene Beams  Sonographer Comments: No subcostal window. IMPRESSIONS  1. Left ventricular ejection fraction, by estimation, is 60 to 65%. The left ventricle has normal function. The left ventricle has no regional wall motion abnormalities. Left ventricular diastolic parameters are indeterminate.  2. Right ventricular systolic function is normal. The right ventricular size is normal. There is mildly elevated pulmonary artery systolic pressure.  3. The pericardial effusion is posterior to the left ventricle.  4. The mitral valve is abnormal. Mild mitral valve regurgitation. No evidence of mitral stenosis.  5. Tricuspid valve regurgitation is moderate.  6. The aortic valve is normal in structure. Aortic valve regurgitation is trivial. Mild to moderate aortic valve sclerosis/calcification is present, without any evidence of aortic stenosis.  7. The inferior vena cava is normal in size with greater than 50% respiratory variability, suggesting right atrial pressure of 3 mmHg. FINDINGS  Left Ventricle: Left ventricular ejection fraction, by estimation, is 60 to 65%. The left ventricle has normal function. The left ventricle has no regional wall motion abnormalities. The left ventricular internal cavity size was normal in size. There is  no left ventricular hypertrophy. Left ventricular diastolic parameters are indeterminate. Right Ventricle: The right ventricular size is normal. No increase in right ventricular wall thickness. Right ventricular systolic function is normal. There is mildly elevated pulmonary artery systolic pressure. The tricuspid regurgitant  velocity is 3.01  m/s, and with an assumed right atrial  pressure of 3 mmHg, the estimated right ventricular systolic pressure is 39.2 mmHg. Left Atrium: Left atrial size was normal in size. Right Atrium: Right atrial size was normal in size. Pericardium: Trivial pericardial effusion is present. The pericardial effusion is posterior to the left ventricle. Mitral Valve: The mitral valve is abnormal. There is mild thickening of the mitral valve leaflet(s). There is mild calcification of the mitral valve leaflet(s). Mild mitral annular calcification. Mild mitral valve regurgitation. No evidence of mitral valve stenosis. Tricuspid Valve: The tricuspid valve is normal in structure. Tricuspid valve regurgitation is moderate . No evidence of tricuspid stenosis. Aortic Valve: The aortic valve is normal in structure. Aortic valve regurgitation is trivial. Aortic regurgitation PHT measures 586 msec. Mild to moderate aortic valve sclerosis/calcification is present, without any evidence of aortic stenosis. Pulmonic Valve: The pulmonic valve was normal in structure. Pulmonic valve regurgitation is not visualized. No evidence of pulmonic stenosis. Aorta: The aortic root is normal in size and structure. Venous: The inferior vena cava is normal in size with greater than 50% respiratory variability, suggesting right atrial pressure of 3 mmHg. IAS/Shunts: The interatrial septum was not well visualized.  LEFT VENTRICLE PLAX 2D LVIDd:         4.80 cm LVIDs:         3.00 cm LV PW:         1.00 cm LV IVS:        1.10 cm LVOT diam:     2.00 cm LV SV:         70 LV SV Index:   37 LVOT Area:     3.14 cm  RIGHT VENTRICLE RV S prime:     18.00 cm/s TAPSE (M-mode): 2.1 cm LEFT ATRIUM              Index       RIGHT ATRIUM           Index LA diam:        3.10 cm  1.62 cm/m  RA Area:     15.50 cm LA Vol (A2C):   105.0 ml 54.91 ml/m RA Volume:   35.90 ml  18.77 ml/m LA Vol (A4C):   124.0 ml 64.85 ml/m LA Biplane Vol: 116.0 ml 60.66 ml/m  AORTIC VALVE LVOT Vmax:   119.33 cm/s LVOT Vmean:  88.900  cm/s LVOT VTI:    0.224 m AI PHT:      586 msec  AORTA Ao Root diam: 3.40 cm Ao Asc diam:  3.40 cm TRICUSPID VALVE TR Peak grad:   36.2 mmHg TR Vmax:        301.00 cm/s  SHUNTS Systemic VTI:  0.22 m Systemic Diam: 2.00 cm Charlton Haws MD Electronically signed by Charlton Haws MD Signature Date/Time: 01/31/2020/2:32:41 PM    Final    ECHOCARDIOGRAM LIMITED BUBBLE STUDY  Result Date: 02/02/2020    ECHOCARDIOGRAM LIMITED REPORT   Patient Name:   KEELIN SHERIDAN Date of Exam: 02/02/2020 Medical Rec #:  [id number]     Height:       66.0 in Accession #:    [id number]    Weight:       180.0 lb Date of Birth:  1940/04/11     BSA:          1.912 m Patient Age:    79 years      BP:  109/67 mmHg Patient Gender: M             HR:           83 bpm. Exam Location:  Inpatient Procedure: Limited Echo and Saline Contrast Bubble Study Indications:    Dyspnea [[id number]  History:        Patient has prior history of Echocardiogram examinations, most                 recent 01/31/2020. CAD, Arrythmias:Atrial Fibrillation; Risk                 Factors:Diabetes. COVID 19.  Sonographer:    Leta Jungling RDCS Referring Phys: [id number] [facility] PATEL IMPRESSIONS  1. Left ventricular ejection fraction, by estimation, is 60 to 65%. The left ventricle has normal function.  2. RV McConnell's Sign noted: this can be seen both in the setting of acute lung pathology and in pulmonary embolism. Right ventricular systolic function is low normal.  3. Agitated saline contrast bubble study was negative, with no evidence of any interatrial shunt. Comparison(s): A prior study was performed on 01/31/20. Compared to prior, new presence of RV McConnell's Sign. FINDINGS  Left Ventricle: Left ventricular ejection fraction, by estimation, is 60 to 65%. The left ventricle has normal function. Right Ventricle: RV McConnell's Sign noted: this can be seen both in the setting of acute lung pathology and in pulmonary embolism. Right ventricular systolic function is  low normal. IAS/Shunts: Agitated saline contrast was given intravenously to evaluate for intracardiac shunting. Agitated saline contrast bubble study was negative, with no evidence of any interatrial shunt. Riley Lam MD Electronically signed by Riley Lam MD Signature Date/Time: 02/02/2020/3:40:56 PM    Final    ECHOCARDIOGRAM LIMITED  Result Date: 02/11/2020    ECHOCARDIOGRAM LIMITED REPORT   Patient Name:   GARRITT MOLYNEUX Date of Exam: 02/11/2020 Medical Rec #:  [id number]     Height:       66.0 in Accession #:    [id number]    Weight:       170.9 lb Date of Birth:  1940-04-17     BSA:          1.870 m Patient Age:    79 years      BP:           142/84 mmHg Patient Gender: M             HR:           89 bpm. Exam Location:  Inpatient Procedure: Limited Echo Indications:    Bacteremia  History:        Patient has prior history of Echocardiogram examinations, most                 recent 02/02/2020. Covid 19 positive.  Sonographer:    Roosvelt Maser RDCS Referring Phys: [phone] MATTHEW R HUNSUCKER IMPRESSIONS  1. Left ventricular ejection fraction, by estimation, is 60 to 65%. The left ventricle has normal function.  2. Right ventricular systolic function is normal. The right ventricular size is mildly enlarged.  3. The mitral valve is normal in structure. Trivial mitral valve regurgitation.  4. Tricuspid valve regurgitation is moderate.  5. The aortic valve is tricuspid. There is mild calcification of the aortic valve. Aortic valve regurgitation is trivial. Comparison(s): A prior study was performed on 01/31/2020. Prior images reviewed side by side. Compared to both recent studies; Improvement in RV function from 02/02/20. Similar tricuspid regurgitation from 01/31/20.  Vegetation not visualized in this study. FINDINGS  Left Ventricle: Left ventricular ejection fraction, by estimation, is 60 to 65%. The left ventricle has normal function. Right Ventricle: The right ventricular size is mildly enlarged.  Right ventricular systolic function is normal. Pericardium: There is no evidence of pericardial effusion. Mitral Valve: The mitral valve is normal in structure. Trivial mitral valve regurgitation. Tricuspid Valve: The tricuspid valve is grossly normal. Tricuspid valve regurgitation is moderate. Aortic Valve: The aortic valve is tricuspid. There is mild calcification of the aortic valve. There is moderate aortic valve annular calcification. Aortic valve regurgitation is trivial. Pulmonic Valve: The pulmonic valve was grossly normal. Pulmonic valve regurgitation is not visualized. Venous: The inferior vena cava was not well visualized. TRICUSPID VALVE TR Peak grad:   34.1 mmHg TR Vmax:        292.00 cm/s Riley Lam MD Electronically signed by Riley Lam MD Signature Date/Time: 02/11/2020/5:33:39 PM    Final    [place] EKG SITE RITE  Result Date: 02/15/2020 If Site Rite image not attached, placement could not be confirmed due to current cardiac rhythm.  [place] EKG SITE RITE  Result Date: 02/11/2020 If [facility] image not attached, placement could not be confirmed due to current cardiac rhythm.    Subjective: Lethargic. Not answering questions.  Discharge Exam: Vitals:   02/24/2020 0510 03/03/2020 0839  BP:  130/65  Pulse:  (!) 128  Resp:  (!) 23  Temp:  (!) 101.2 F (38.4 C)  SpO2: 91% (!) 84%   Vitals:   02/23/2020 0052 03/13/2020 0430 03/01/2020 0510 03/03/2020 0839  BP:  (!) 157/130  130/65  Pulse: (!) 101 (!) 150  (!) 128  Resp: (!) 25 (!) 24  (!) 23  Temp:  (!) 100.4 F (38 C)  (!) 101.2 F (38.4 C)  TempSrc:  Oral  Axillary  SpO2: 96% (!) 81% 91% (!) 84%  Weight:      Height:        General: Lethargic. Cardiovascular: RRR, S1/S2 +, no rubs, no gallops Respiratory: Diminished. Slight tachypnea. Abdominal: Soft, NT, ND, bowel sounds + Extremities: no edema, no cyanosis    The results of significant diagnostics from this hospitalization (including imaging, microbiology,  ancillary and laboratory) are listed below for reference.     Microbiology: Recent Results (from the past 240 hour(s))  Culture, blood (Routine X 2) w Reflex to ID Panel     Status: None   Collection Time: 02/13/20  9:23 AM   Specimen: BLOOD  Result Value Ref Range Status   Specimen Description BLOOD RIGHT ANTECUBITAL  Final   Special Requests   Final    BOTTLES DRAWN AEROBIC ONLY Blood Culture results may not be optimal due to an inadequate volume of blood received in culture bottles   Culture   Final    NO GROWTH 5 DAYS Performed at [facility], [address], [place], [place] [address]    Report Status 02/18/2020 FINAL  Final  Culture, blood (Routine X 2) w Reflex to ID Panel     Status: None   Collection Time: 02/13/20  1:24 PM   Specimen: BLOOD RIGHT HAND  Result Value Ref Range Status   Specimen Description BLOOD RIGHT HAND  Final   Special Requests   Final    BOTTLES DRAWN AEROBIC AND ANAEROBIC Blood Culture adequate volume   Culture   Final    NO GROWTH 5 DAYS Performed at [facility], [address], [place], [place] [address]    Report Status 02/18/2020 FINAL  Final  Culture, blood (routine x 2)     Status: None (Preliminary result)   Collection Time: 02/18/20 11:21 AM   Specimen: BLOOD  Result Value Ref Range Status   Specimen Description BLOOD SITE NOT SPECIFIED  Final   Special Requests   Final    BOTTLES DRAWN AEROBIC ONLY Blood Culture results may not be optimal due to an inadequate volume of blood received in culture bottles   Culture   Final    NO GROWTH 4 DAYS Performed at [facility], [address], [place], [place] [address]    Report Status PENDING  Incomplete  Culture, blood (routine x 2)     Status: None (Preliminary result)   Collection Time: 02/18/20 11:21 AM   Specimen: BLOOD  Result Value Ref Range Status   Specimen Description BLOOD SITE NOT SPECIFIED  Final   Special Requests   Final    BOTTLES DRAWN AEROBIC ONLY Blood  Culture results may not be optimal due to an inadequate volume of blood received in culture bottles   Culture   Final    NO GROWTH 4 DAYS Performed at [facility], [address], [place], [place] [address]    Report Status PENDING  Incomplete  Culture, Urine     Status: None   Collection Time: 02/18/20  7:46 PM   Specimen: Urine, Random  Result Value Ref Range Status   Specimen Description URINE, RANDOM  Final   Special Requests NONE  Final   Culture   Final    NO GROWTH Performed at [facility], [address] [place], [place] [address]    Report Status 02/20/2020 FINAL  Final     Labs: BNP (last 3 results) Recent Labs    02/08/20 0234 02/09/20 0201 02/10/20 1827  BNP 86.6 75.8 257.4*   Basic Metabolic Panel: Recent Labs  Lab 02/16/20 0204 02/17/20 0421 02/18/20 0345 02/19/20 0356 02/20/20 0330 02/21/20 0420  NA 131* 131*  --   --  130* 132*  K 4.1 3.7  --   --  3.2* 4.3  CL 102 102  --   --  97* 99  CO2 20* 21*  --   --  23 23  GLUCOSE 84 86  --   --  98 108*  BUN 13 7*  --   --  18 21  CREATININE 0.53* 0.62  --   --  1.12 1.06  CALCIUM 7.5* 7.7*  --   --  7.6* 8.1*  MG 1.9 1.7 1.8 1.7 1.8 1.9   Liver Function Tests: Recent Labs  Lab 02/16/20 1107  AST 29  ALT 17  ALKPHOS 52  BILITOT 1.5*  PROT 6.0*  ALBUMIN 1.7*   Recent Labs  Lab 02/16/20 1107  LIPASE 89*   No results for input(s): AMMONIA in the last 168 hours. CBC: Recent Labs  Lab 02/16/20 0204 02/17/20 0421 02/18/20 0345 02/20/20 0330 02/21/20 0420  WBC 7.3 7.1 7.1 7.5 9.8  HGB 11.3* 10.1* 11.2* 9.4* 10.2*  HCT 32.6* 29.8* 34.7* 29.1* 32.3*  MCV 90.8 92.8 93.8 93.9 95.3  PLT 80* 96* 127* 144* 186   Cardiac Enzymes: No results for input(s): CKTOTAL, CKMB, CKMBINDEX, TROPONINI in the last 168 hours. BNP: Invalid input(s): POCBNP CBG: Recent Labs  Lab 02/20/20 2059 02/21/20 0736 02/21/20 1242 02/21/20 1705 03/18/2020 0835  GLUCAP 155* 130* 91 105* 123*  D-Dimer No results for input(s): DDIMER in the last 72 hours. Hgb A1c No results for input(s): HGBA1C in the last 72 hours. Lipid Profile No results for input(s): CHOL, HDL, LDLCALC, TRIG, CHOLHDL, LDLDIRECT in the last 72 hours. Thyroid function studies No results for input(s): TSH, T4TOTAL, T3FREE, THYROIDAB in the last 72 hours.  Invalid input(s): FREET3 Anemia work up No results for input(s): VITAMINB12, FOLATE, FERRITIN, TIBC, IRON, RETICCTPCT in the last 72 hours. Urinalysis    Component Value Date/Time   COLORURINE YELLOW 01/21/2020 1925   APPEARANCEUR CLEAR 02/02/2020 1925   LABSPEC 1.015 01/22/2020 1925   PHURINE 5.0 01/25/2020 1925   GLUCOSEU NEGATIVE 01/29/2020 1925   HGBUR NEGATIVE 02/12/2020 1925   BILIRUBINUR NEGATIVE 02/05/2020 1925   KETONESUR 15 (A) 02/01/2020 1925   PROTEINUR NEGATIVE 02/09/2020 1925   NITRITE NEGATIVE 01/23/2020 1925   LEUKOCYTESUR NEGATIVE 01/27/2020 1925   Sepsis Labs Invalid input(s): PROCALCITONIN,  WBC,  LACTICIDVEN Microbiology Recent Results (from the past 240 hour(s))  Culture, blood (Routine X 2) w Reflex to ID Panel     Status: None   Collection Time: 02/13/20  9:23 AM   Specimen: BLOOD  Result Value Ref Range Status   Specimen Description BLOOD RIGHT ANTECUBITAL  Final   Special Requests   Final    BOTTLES DRAWN AEROBIC ONLY Blood Culture results may not be optimal due to an inadequate volume of blood received in culture bottles   Culture   Final    NO GROWTH 5 DAYS Performed at [facility], [address], [place], [place] [address]    Report Status 02/18/2020 FINAL  Final  Culture, blood (Routine X 2) w Reflex to ID Panel     Status: None   Collection Time: 02/13/20  1:24 PM   Specimen: BLOOD RIGHT HAND  Result Value Ref Range Status   Specimen Description BLOOD RIGHT HAND  Final   Special Requests   Final    BOTTLES DRAWN AEROBIC AND ANAEROBIC Blood Culture adequate volume   Culture   Final    NO GROWTH 5  DAYS Performed at [facility], [address] [place], [place] [address]    Report Status 02/18/2020 FINAL  Final  Culture, blood (routine x 2)     Status: None (Preliminary result)   Collection Time: 02/18/20 11:21 AM   Specimen: BLOOD  Result Value Ref Range Status   Specimen Description BLOOD SITE NOT SPECIFIED  Final   Special Requests   Final    BOTTLES DRAWN AEROBIC ONLY Blood Culture results may not be optimal due to an inadequate volume of blood received in culture bottles   Culture   Final    NO GROWTH 4 DAYS Performed at [facility], [address] [place], [place] [address]    Report Status PENDING  Incomplete  Culture, blood (routine x 2)     Status: None (Preliminary result)   Collection Time: 02/18/20 11:21 AM   Specimen: BLOOD  Result Value Ref Range Status   Specimen Description BLOOD SITE NOT SPECIFIED  Final   Special Requests   Final    BOTTLES DRAWN AEROBIC ONLY Blood Culture results may not be optimal due to an inadequate volume of blood received in culture bottles   Culture   Final    NO GROWTH 4 DAYS Performed at [facility], [address], [place], [place] [address]    Report Status PENDING  Incomplete  Culture, Urine  Status: None   Collection Time: 02/18/20  7:46 PM   Specimen: Urine, Random  Result Value Ref Range Status   Specimen Description URINE, RANDOM  Final   Special Requests NONE  Final   Culture   Final    NO GROWTH Performed at [facility], [address], [place], [place] [address]    Report Status 02/20/2020 FINAL  Final     Time coordinating discharge: 35 minutes  SIGNED:   Jacquelin Hawking, MD [facility] 03/10/2020, 11:58 AM

## 2020-03-19 NOTE — Progress Notes (Signed)
Physical Therapy Discharge Patient Details Name: Javier Beard MRN: [id number] DOB: [birth year] Today's Date: 02/21/2020 Time:  -     Patient discharged from PT services secondary to Pt has been moved to comfort care, and plan is for d/c home with hospice. .  Please see latest therapy progress note for current level of functioning and progress toward goals.    Progress and discharge plan discussed with patient and/or caregiver: Plans for transfer to Hospice care.   GP    Courtney Paris. Beverely Risen PT, DPT Acute Rehabilitation Services Pager [phone] Office [phone]  [name] [facility] 03/03/2020, 8:56 AM

## 2020-03-19 NOTE — Progress Notes (Signed)
   I have talked with pt's daughter and she is in agreement to hospice services and comfort care approach at home. She is in agreement to stopping the IVF, IV antibiotics and no feeding tube. She confirms pt is a DNR. I have ordered necessary equipment yesterday to be delivered. It was not delivered last night unknown reason why. It is suppose to be delivered today by 12N. The daughter will call once equipment is in home and I will reach out to Transitions of care to update them so that if d/c plan is home today we can assist in a timely d/c. Out staff will see the pt upon arrival in home. Please send pt home with a 3 day supply of morphine and ativan in event it is late when pt gets home tonight.   Norm Parcel RN [phone]

## 2020-03-19 NOTE — Progress Notes (Signed)
I spoke with Cheri from Hospice this am. I also rounded with DR. Nettey who was in agreement not to give any PO meds, abts and to stop fluids. Waiting for him to change current orders to establish CC. Pt leaving today to go home on Hospice. I also spoke with daughter Babette Relic this am and gave her an update on the pt. Will cont to monitor.

## 2020-03-19 NOTE — TOC Transition Note (Signed)
Transition of Care [facility]) - CM/SW Discharge Note   Patient Details  Name: Javier Beard MRN: [id number] Date of Birth: [birth year]  Transition [facility]) CM/SW Contact:  Mearl Latin, LCSW Phone Number: 02/23/2020, 2:15 PM   Clinical Narrative:    Patient will DC to: Home Anticipated DC date: 03/06/2020 Family notified: Daughter Transport by: Angela Nevin   Per MD patient ready for DC to home. RN, patient, patient's family, and hospice notified of DC. DC packet on chart. Ambulance transport requested for patient.   CSW will sign off for now as social work intervention is no longer needed. Please consult [place] again if new needs arise.      Final next level of care: Home w Hospice Care Barriers to Discharge: Barriers Resolved   Patient Goals and CMS Choice Patient states their goals for this hospitalization and ongoing recovery are:: to return to home CMS Medicare.gov Compare Post Acute Care list provided to:: Patient Choice offered to / list presented to : Adult Children  Discharge Placement                Patient to be transferred to facility by: [facility] Name of family member notified: Daughter Patient and family notified of of transfer: 02/23/2020  Discharge Plan and Services In-house Referral: Clinical Social Work Discharge Planning Services: CM Consult Post Acute Care Choice: Home Health                      [facility] Agency: [facility] Date [facility] Agency Contacted: 02/21/20 Time HH Agency Contacted: [phone] Representative spoke with at [facility] Agency: Revonda Standard on referral line  Social Determinants of Health (SDOH) Interventions     Readmission Risk Interventions No flowsheet data found.

## 2020-03-19 DEATH — deceased
